# Patient Record
Sex: Female | Born: 1935 | Hispanic: No | Smoking: Never smoker
Health system: Southern US, Community
[De-identification: ages and names within clinical notes are randomized; demographics above are authoritative.]

## PROBLEM LIST (undated history)

## (undated) DIAGNOSIS — Q2543 Congenital aneurysm of aorta: Secondary | ICD-10-CM

## (undated) DIAGNOSIS — I7100 Dissection of unspecified site of aorta: Secondary | ICD-10-CM

## (undated) DIAGNOSIS — I5042 Chronic combined systolic (congestive) and diastolic (congestive) heart failure: Secondary | ICD-10-CM

## (undated) DIAGNOSIS — I700 Atherosclerosis of aorta: Principal | ICD-10-CM

## (undated) DIAGNOSIS — I10 Essential (primary) hypertension: Secondary | ICD-10-CM

## (undated) DIAGNOSIS — I4892 Unspecified atrial flutter: Secondary | ICD-10-CM

## (undated) DIAGNOSIS — I7121 Aneurysm of the ascending aorta, without rupture: Secondary | ICD-10-CM

## (undated) DIAGNOSIS — I482 Chronic atrial fibrillation, unspecified: Secondary | ICD-10-CM

## (undated) DIAGNOSIS — E876 Hypokalemia: Secondary | ICD-10-CM

## (undated) DIAGNOSIS — I719 Aortic aneurysm of unspecified site, without rupture: Secondary | ICD-10-CM

## (undated) HISTORY — DX: Congenital aneurysm of aorta: Q25.43

## (undated) HISTORY — DX: Aneurysm of the ascending aorta, without rupture: I71.21

## (undated) HISTORY — DX: Essential (primary) hypertension: I10

## (undated) HISTORY — DX: Dissection of unspecified site of aorta: I71.00

## (undated) HISTORY — DX: Aortic aneurysm of unspecified site, without rupture: I71.9

## (undated) HISTORY — DX: Hypokalemia: E87.6

## (undated) HISTORY — DX: Chronic atrial fibrillation, unspecified: I48.20

## (undated) HISTORY — DX: Chronic combined systolic (congestive) and diastolic (congestive) heart failure: I50.42

## (undated) HISTORY — DX: Unspecified atrial flutter: I48.92

## (undated) HISTORY — DX: Atherosclerosis of aorta: I70.0

---

## 2012-10-12 HISTORY — PX: ASCENDING AORTIC ANEURYSM REPAIR: SHX1191

## 2012-11-04 ENCOUNTER — Other Ambulatory Visit: Payer: Self-pay | Admitting: Cardiology

## 2012-11-04 DIAGNOSIS — I77810 Thoracic aortic ectasia: Secondary | ICD-10-CM

## 2012-11-06 ENCOUNTER — Ambulatory Visit
Admission: RE | Admit: 2012-11-06 | Discharge: 2012-11-06 | Disposition: A | Discharge status: Home / Self Care | Payer: Medicare PPO | Source: Ambulatory Visit | Attending: Cardiology | Admitting: Cardiology

## 2012-11-06 DIAGNOSIS — I77810 Thoracic aortic ectasia: Secondary | ICD-10-CM

## 2012-11-06 MED ORDER — IOHEXOL 350 MG/ML SOLN
100.0000 mL | Freq: Once | INTRAVENOUS | Status: AC | PRN
  Administered 2012-11-06: 100 mL via INTRAVENOUS

## 2012-11-07 ENCOUNTER — Other Ambulatory Visit: Payer: Self-pay | Admitting: *Deleted

## 2012-11-07 ENCOUNTER — Institutional Professional Consult (permissible substitution) (INDEPENDENT_AMBULATORY_CARE_PROVIDER_SITE_OTHER): Payer: Medicare PPO | Admitting: Cardiothoracic Surgery

## 2012-11-07 ENCOUNTER — Encounter: Payer: Self-pay | Admitting: Cardiothoracic Surgery

## 2012-11-07 VITALS — BP 140/89 | HR 96 | Resp 20 | Ht 60.75 in | Wt 149.0 lb

## 2012-11-07 DIAGNOSIS — I4891 Unspecified atrial fibrillation: Secondary | ICD-10-CM

## 2012-11-07 DIAGNOSIS — I719 Aortic aneurysm of unspecified site, without rupture: Secondary | ICD-10-CM

## 2012-11-07 DIAGNOSIS — I7100 Dissection of unspecified site of aorta: Secondary | ICD-10-CM

## 2012-11-07 DIAGNOSIS — I10 Essential (primary) hypertension: Secondary | ICD-10-CM

## 2012-11-07 NOTE — Progress Notes (Signed)
PCP is Emeterio Reeve, MD Referring Provider is Donato Schultz, MD  Chief Complaint  Patient presents with  . Thoracic Aortic Aneurysm    Referral from Dr Anne Fu for surgical eval on aortic root dilation, CTA Chest 11/06/12, last Echo 05/20/12   History and physical   Admission diagnosis  Intramural hematoma of the ascending and descending thoracic aorta-ascending aortic aneurysm measuring 5.5 cm  HPI: 79 year old [place] female with hypertension but nonsmoker who had an episode of severe chest pain approximately 2 and half to 3 weeks ago described as a "band of pain" when she was lifting up some furniture. She thought she was going to die but did not seek immediate medical attention but is rested for 24-48 hours of gradual improvement in the pain. She is currently asymptomatic. She was evaluated by Dr. Anne Fu for cardiac evaluation at the request of her primary care physician. Dr. Anne Fu ordered a CTA of the thoracic aorta and performed a echocardiogram. The echocardiogram showed good function without significant valvular disease. The CTA showed an ascending fusiform aneurysm with intramural hematoma extending from the aortic root around to the descending thoracic aorta without active extravasation. There is no pericardial effusion.  The patient has long history of atrial fibrillation and is on chronic Coumadin therapy. Her Coumadin is on hold. She had a DC cardioversion proximally 3 years ago in [place] for A. fib.  Past Medical History  Diagnosis Date  . Afib   . Aortic root aneurysm   . Hypertension     No past surgical history on file. no prior surgical procedures  No family history on file. no history of thoracic or bowel aneurysm in the family  Social History History  Substance Use Topics  . Smoking status: Never Smoker   . Smokeless tobacco: Not on file  . Alcohol Use: No    Current Outpatient Prescriptions  Medication Sig Dispense Refill  . acetaminophen (TYLENOL) 325  MG tablet Take 650 mg by mouth every 6 (six) hours as needed for pain.      Marland Kitchen amiodarone (PACERONE) 100 MG tablet Take 100 mg by mouth daily.      Marland Kitchen amLODipine (NORVASC) 10 MG tablet Take 10 mg by mouth daily.      Marland Kitchen warfarin (COUMADIN) 5 MG tablet Take 5 mg by mouth daily.       No current facility-administered medications for this visit.    No Known Allergies  Review of Systems No history of thoracic trauma No history of mini stroke DVT claudication TIA No history of recent pulmonary infection tuberculosis hemoptysis No history of diabetes No history DVT No difficulty swallowing Lives with her daughter but tries to remain fairly active  BP 140/89  Pulse 96  Resp 20  Ht 5' 0.75" (1.543 m)  Wt 149 lb (67.586 kg)  BMI 28.39 kg/m2  SpO2 98% Physical Exam Gen. appearance fairly healthy appearing 79 year old woman no acute distress HEENT normocephalic pupils equal dentition good Neck good carotid pulses no breathing no JVD Thorax no deformity clear breath sounds no tenderness COR no murmur irregular rhythm with  A. Fib Abdomen soft nontender without pulsatile mass Extremities warm good pulses Neuro alert and oriented no focal motor deficit  Diagnostic Tests: CTA of thoracic aorta personally reviewed patient discussed with Dr. Anne Fu. 2-D echo report from [facility] cardiology reviewed  Impression: Intramural hematoma with ascending aneurysm at high risk for dissection. Patient would benefit from replacement of her a sending aorta and possible replacement Bentall.  Plan: Stop  Coumadin now Patient will be admitted to the hospital to follow INR which will need to be normalized prior to surgery which is scheduled for March 31 no heparin due to the intramural hematoma.. Patient will need a 2-D echocardiogram in the hospital as well as PFTs carotid Doppler and cardiac rehabilitation consult.

## 2012-11-08 ENCOUNTER — Inpatient Hospital Stay (HOSPITAL_COMMUNITY): Payer: Medicare PPO

## 2012-11-08 ENCOUNTER — Other Ambulatory Visit: Payer: Self-pay | Admitting: *Deleted

## 2012-11-08 ENCOUNTER — Inpatient Hospital Stay (HOSPITAL_COMMUNITY)
Admission: AD | Admit: 2012-11-08 | Discharge: 2012-11-19 | DRG: 220 | Disposition: A | Discharge status: Skilled Nursing Facility | Payer: Medicare PPO | Source: Ambulatory Visit | Attending: Cardiothoracic Surgery | Admitting: Cardiothoracic Surgery

## 2012-11-08 ENCOUNTER — Encounter (HOSPITAL_COMMUNITY): Payer: Self-pay | Admitting: *Deleted

## 2012-11-08 DIAGNOSIS — I77819 Aortic ectasia, unspecified site: Secondary | ICD-10-CM

## 2012-11-08 DIAGNOSIS — Z01810 Encounter for preprocedural cardiovascular examination: Secondary | ICD-10-CM

## 2012-11-08 DIAGNOSIS — I498 Other specified cardiac arrhythmias: Secondary | ICD-10-CM | POA: Diagnosis present

## 2012-11-08 DIAGNOSIS — IMO0002 Reserved for concepts with insufficient information to code with codable children: Secondary | ICD-10-CM | POA: Diagnosis not present

## 2012-11-08 DIAGNOSIS — I712 Thoracic aortic aneurysm, without rupture, unspecified: Secondary | ICD-10-CM | POA: Diagnosis present

## 2012-11-08 DIAGNOSIS — I10 Essential (primary) hypertension: Secondary | ICD-10-CM | POA: Diagnosis present

## 2012-11-08 DIAGNOSIS — I7100 Dissection of unspecified site of aorta: Secondary | ICD-10-CM | POA: Diagnosis present

## 2012-11-08 DIAGNOSIS — Y849 Medical procedure, unspecified as the cause of abnormal reaction of the patient, or of later complication, without mention of misadventure at the time of the procedure: Secondary | ICD-10-CM | POA: Diagnosis present

## 2012-11-08 DIAGNOSIS — Z7901 Long term (current) use of anticoagulants: Secondary | ICD-10-CM

## 2012-11-08 DIAGNOSIS — I71019 Dissection of thoracic aorta, unspecified: Principal | ICD-10-CM | POA: Diagnosis present

## 2012-11-08 DIAGNOSIS — I4891 Unspecified atrial fibrillation: Secondary | ICD-10-CM | POA: Diagnosis present

## 2012-11-08 DIAGNOSIS — J90 Pleural effusion, not elsewhere classified: Secondary | ICD-10-CM | POA: Diagnosis present

## 2012-11-08 DIAGNOSIS — D62 Acute posthemorrhagic anemia: Secondary | ICD-10-CM | POA: Diagnosis not present

## 2012-11-08 DIAGNOSIS — Z23 Encounter for immunization: Secondary | ICD-10-CM

## 2012-11-08 DIAGNOSIS — I7101 Dissection of thoracic aorta: Principal | ICD-10-CM | POA: Diagnosis present

## 2012-11-08 LAB — MRSA PCR SCREENING: MRSA by PCR: NEGATIVE

## 2012-11-08 LAB — CBC
HCT: 37.2 % (ref 36.0–46.0)
Hemoglobin: 12.2 g/dL (ref 12.0–15.0)
MCH: 28.6 pg (ref 26.0–34.0)
MCHC: 32.8 g/dL (ref 30.0–36.0)
MCV: 87.3 fL (ref 78.0–100.0)
Platelets: 456 10*3/uL — ABNORMAL HIGH (ref 150–400)
RBC: 4.26 MIL/uL (ref 3.87–5.11)
RDW: 14.4 % (ref 11.5–15.5)
WBC: 6.8 10*3/uL (ref 4.0–10.5)

## 2012-11-08 LAB — APTT: aPTT: 41 seconds — ABNORMAL HIGH (ref 24–37)

## 2012-11-08 LAB — URINALYSIS, ROUTINE W REFLEX MICROSCOPIC
Bilirubin Urine: NEGATIVE
Glucose, UA: NEGATIVE mg/dL
Ketones, ur: NEGATIVE mg/dL
Leukocytes, UA: NEGATIVE
Nitrite: NEGATIVE
Protein, ur: NEGATIVE mg/dL
Specific Gravity, Urine: 1.012 (ref 1.005–1.030)
Urobilinogen, UA: 0.2 mg/dL (ref 0.0–1.0)
pH: 6.5 (ref 5.0–8.0)

## 2012-11-08 LAB — PROTIME-INR
INR: 1.61 — ABNORMAL HIGH (ref 0.00–1.49)
Prothrombin Time: 18.6 seconds — ABNORMAL HIGH (ref 11.6–15.2)

## 2012-11-08 LAB — URINE MICROSCOPIC-ADD ON

## 2012-11-08 LAB — TSH: TSH: 0.706 u[IU]/mL (ref 0.350–4.500)

## 2012-11-08 MED ORDER — ACETAMINOPHEN 325 MG PO TABS
650.0000 mg | ORAL_TABLET | Freq: Four times a day (QID) | ORAL | Status: DC | PRN
  Administered 2012-11-10: 650 mg via ORAL
  Filled 2012-11-08: qty 2

## 2012-11-08 MED ORDER — DOCUSATE SODIUM 100 MG PO CAPS
100.0000 mg | ORAL_CAPSULE | Freq: Two times a day (BID) | ORAL | Status: DC
  Administered 2012-11-09 – 2012-11-10 (×4): 100 mg via ORAL
  Filled 2012-11-08 (×7): qty 1

## 2012-11-08 MED ORDER — ONDANSETRON HCL 4 MG PO TABS: 4.0000 mg | ORAL_TABLET | Freq: Four times a day (QID) | ORAL | Status: DC | PRN

## 2012-11-08 MED ORDER — AMIODARONE HCL 100 MG PO TABS
100.0000 mg | ORAL_TABLET | Freq: Every day | ORAL | Status: DC
  Administered 2012-11-09 – 2012-11-10 (×2): 100 mg via ORAL
  Filled 2012-11-08 (×5): qty 1

## 2012-11-08 MED ORDER — SODIUM CHLORIDE 0.9 % IV SOLN: 250.0000 mL | INTRAVENOUS | Status: DC | PRN

## 2012-11-08 MED ORDER — SODIUM CHLORIDE 0.9 % IJ SOLN
3.0000 mL | Freq: Two times a day (BID) | INTRAMUSCULAR | Status: DC
  Administered 2012-11-08 – 2012-11-10 (×4): 3 mL via INTRAVENOUS

## 2012-11-08 MED ORDER — ONDANSETRON HCL 4 MG/2ML IJ SOLN: 4.0000 mg | Freq: Four times a day (QID) | INTRAMUSCULAR | Status: DC | PRN

## 2012-11-08 MED ORDER — ADULT MULTIVITAMIN W/MINERALS CH
1.0000 | ORAL_TABLET | Freq: Every day | ORAL | Status: DC
  Administered 2012-11-08 – 2012-11-10 (×3): 1 via ORAL
  Filled 2012-11-08 (×4): qty 1

## 2012-11-08 MED ORDER — AMLODIPINE BESYLATE 10 MG PO TABS
10.0000 mg | ORAL_TABLET | Freq: Every day | ORAL | Status: DC
  Administered 2012-11-08 – 2012-11-10 (×3): 10 mg via ORAL
  Filled 2012-11-08 (×4): qty 1

## 2012-11-08 MED ORDER — SODIUM CHLORIDE 0.9 % IJ SOLN: 3.0000 mL | INTRAMUSCULAR | Status: DC | PRN

## 2012-11-08 MED ORDER — SODIUM CHLORIDE 0.9 % IJ SOLN
3.0000 mL | Freq: Two times a day (BID) | INTRAMUSCULAR | Status: DC
  Administered 2012-11-09: 3 mL via INTRAVENOUS

## 2012-11-08 MED ORDER — ACETAMINOPHEN 650 MG RE SUPP: 650.0000 mg | Freq: Four times a day (QID) | RECTAL | Status: DC | PRN

## 2012-11-08 MED ORDER — ALUM & MAG HYDROXIDE-SIMETH 200-200-20 MG/5ML PO SUSP: 30.0000 mL | Freq: Four times a day (QID) | ORAL | Status: DC | PRN

## 2012-11-08 NOTE — H&P (Signed)
PCP is Emeterio Reeve, MD  Referring Provider is Donato Schultz, MD  Chief Complaint   Patient presents with   .  Thoracic Aortic Aneurysm     Referral from Dr Anne Fu for surgical eval on aortic root dilation, CTA Chest 11/06/12, last Echo 05/20/12    History and physical per Dr VanTrigt's consultation  Admission diagnosis  Intramural hematoma of the ascending and descending thoracic aorta-ascending aortic aneurysm measuring 5.5 cm  HPI:  79 year old [name] with hypertension but nonsmoker who had an episode of severe chest pain approximately 2 and half to 3 weeks ago described as a "band of pain" when she was lifting up some furniture. She thought she was going to die but did not seek immediate medical attention but is rested for 24-48 hours of gradual improvement in the pain. She is currently asymptomatic. She was evaluated by Dr. Anne Fu for cardiac evaluation at the request of her primary care physician. Dr. Anne Fu ordered a CTA of the thoracic aorta and performed a echocardiogram. The echocardiogram showed good function without significant valvular disease. The CTA showed an ascending fusiform aneurysm with intramural hematoma extending from the aortic root around to the descending thoracic aorta without active extravasation. There is no pericardial effusion.  The patient has long history of atrial fibrillation and is on chronic Coumadin therapy. Her Coumadin is on hold. She had a DC cardioversion proximally 3 years ago in [place] for A. fib.  Past Medical History   Diagnosis  Date   .  Afib    .  Aortic root aneurysm    .  Hypertension     No past surgical history on file. no prior surgical procedures  No family history on file. no history of thoracic or bowel aneurysm in the family  Social History  History   Substance Use Topics   .  Smoking status:  Never Smoker   .  Smokeless tobacco:  Not on file   .  Alcohol Use:  No    Current Outpatient Prescriptions   Medication  Sig   Dispense  Refill   .  acetaminophen (TYLENOL) 325 MG tablet  Take 650 mg by mouth every 6 (six) hours as needed for pain.     Marland Kitchen  amiodarone (PACERONE) 100 MG tablet  Take 100 mg by mouth daily.     Marland Kitchen  amLODipine (NORVASC) 10 MG tablet  Take 10 mg by mouth daily.     Marland Kitchen  warfarin (COUMADIN) 5 MG tablet  Take 5 mg by mouth daily.      No current facility-administered medications for this visit.    No Known Allergies  Review of Systems  No history of thoracic trauma  No history of mini stroke DVT claudication TIA  No history of recent pulmonary infection tuberculosis hemoptysis  No history of diabetes  No history DVT  No difficulty swallowing  Lives with her daughter but tries to remain fairly active  BP 140/89  Pulse 96  Resp 20  Ht 5' 0.75" (1.543 m)  Wt 149 lb (67.586 kg)  BMI 28.39 kg/m2  SpO2 98%  Physical Exam  Gen. appearance fairly healthy appearing 79 year old woman no acute distress  HEENT normocephalic pupils equal dentition good  Neck good carotid pulses no breathing no JVD  Thorax no deformity clear breath sounds no tenderness  COR no murmur irregular rhythm with A. Fib  Abdomen soft nontender without pulsatile mass  Extremities warm good pulses  Neuro alert and oriented no focal  motor deficit  Diagnostic Tests:  CTA of thoracic aorta personally reviewed patient discussed with Dr. Anne Fu. 2-D echo report from [facility] cardiology reviewed  Impression:  Intramural hematoma with ascending aneurysm at high risk for dissection. Patient would benefit from replacement of her a sending aorta and possible replacement Bentall.  Plan:  Stop Coumadin now  Patient will be admitted to the hospital to follow INR which will need to be normalized prior to surgery which is scheduled for March 31 no heparin due to the intramural hematoma.. Patient will need a 2-D echocardiogram in the hospital as well as PFTs carotid Doppler and cardiac rehabilitation consult.

## 2012-11-08 NOTE — Progress Notes (Signed)
11/08/2012 5:24 PM Nursing note Pt. And daughters given cardiac surgery education packet. Contents reviewed. Questions encouraged and addressed.  Krista Som, Blanchard Kelch

## 2012-11-08 NOTE — Progress Notes (Signed)
*  PRELIMINARY RESULTS* Echocardiogram 2D Echocardiogram has been performed.  Jeryl Columbia 11/08/2012, 3:26 PM

## 2012-11-09 ENCOUNTER — Encounter (HOSPITAL_COMMUNITY): Payer: Self-pay | Admitting: Anesthesiology

## 2012-11-09 ENCOUNTER — Inpatient Hospital Stay (HOSPITAL_COMMUNITY): Payer: Medicare PPO

## 2012-11-09 DIAGNOSIS — I712 Thoracic aortic aneurysm, without rupture: Secondary | ICD-10-CM

## 2012-11-09 LAB — PROTIME-INR
INR: 1.46 (ref 0.00–1.49)
Prothrombin Time: 17.3 seconds — ABNORMAL HIGH (ref 11.6–15.2)

## 2012-11-09 LAB — BLOOD GAS, ARTERIAL
Acid-Base Excess: 2.6 mmol/L — ABNORMAL HIGH (ref 0.0–2.0)
Bicarbonate: 26.3 mEq/L — ABNORMAL HIGH (ref 20.0–24.0)
Drawn by: 24487
FIO2: 0.21 %
O2 Saturation: 91.2 %
Patient temperature: 98.6
TCO2: 27.5 mmol/L (ref 0–100)
pCO2 arterial: 38 mmHg (ref 35.0–45.0)
pH, Arterial: 7.455 — ABNORMAL HIGH (ref 7.350–7.450)
pO2, Arterial: 58.3 mmHg — ABNORMAL LOW (ref 80.0–100.0)

## 2012-11-09 LAB — COMPREHENSIVE METABOLIC PANEL
ALT: 28 U/L (ref 0–35)
AST: 19 U/L (ref 0–37)
Albumin: 2.7 g/dL — ABNORMAL LOW (ref 3.5–5.2)
Alkaline Phosphatase: 76 U/L (ref 39–117)
BUN: 13 mg/dL (ref 6–23)
CO2: 26 mEq/L (ref 19–32)
Calcium: 9 mg/dL (ref 8.4–10.5)
Chloride: 105 mEq/L (ref 96–112)
Creatinine, Ser: 0.88 mg/dL (ref 0.50–1.10)
GFR calc Af Amer: 69 mL/min — ABNORMAL LOW (ref >90)
GFR calc non Af Amer: 60 mL/min — ABNORMAL LOW (ref >90)
Glucose, Bld: 114 mg/dL — ABNORMAL HIGH (ref 70–99)
Potassium: 3.5 mEq/L (ref 3.5–5.1)
Sodium: 140 mEq/L (ref 135–145)
Total Bilirubin: 0.6 mg/dL (ref 0.3–1.2)
Total Protein: 7 g/dL (ref 6.0–8.3)

## 2012-11-09 NOTE — Progress Notes (Addendum)
                    [address]            [name] [address]          [phone]       Procedure(s) (LRB): BENTALL PROCEDURE (N/A) INTRAOPERATIVE TRANSESOPHAGEAL ECHOCARDIOGRAM (N/A)  Subjective: Doesn't feel as well this am, no specific complaints.  No pain.   Objective: Vital signs in last 24 hours: Patient Vitals for the past 24 hrs:  BP Temp Temp src Pulse Resp SpO2 Height Weight  11/09/12 0421 126/69 mmHg 98.2 F (36.8 C) Oral 71 20 92 % - 150 lb 1.6 oz (68.085 kg)  11/08/12 1928 126/73 mmHg 97.9 F (36.6 C) Oral 76 18 91 % - -  11/08/12 1757 128/70 mmHg 98.1 F (36.7 C) Oral 78 18 92 % 5\' 1"  (1.549 m) 146 lb 9.7 oz (66.5 kg)  11/08/12 1622 128/70 mmHg 98.1 F (36.7 C) - 78 18 92 % - -  11/08/12 1217 125/76 mmHg - - 70 18 92 % 5' 0.1" (1.527 m) 146 lb 9.7 oz (66.5 kg)   Current Weight  11/09/12 150 lb 1.6 oz (68.085 kg)     Intake/Output from previous day:      PHYSICAL EXAM:  Heart: RRR Lungs: Clear Abdomen: soft, NT/ND, +BS Extremities:  Warm, well perfused    Lab Results: CBC: Recent Labs  11/08/12 1356  WBC 6.8  HGB 12.2  HCT 37.2  PLT 456*   BMET:  Recent Labs  11/09/12 0500  NA 140  K 3.5  CL 105  CO2 26  GLUCOSE 114*  BUN 13  CREATININE 0.88  CALCIUM 9.0    PT/INR:  Recent Labs  11/09/12 0500  LABPROT 17.3*  INR 1.46   2D Echo:   Study Conclusions - Left ventricle: The cavity size was normal. Systolic function was normal. The estimated ejection fraction was in the range of 55% to 60%. Although no diagnostic regional wall motion abnormality was identified, this possibility cannot be completely excluded on the basis of this study. - Ascending aorta: The ascending aorta was moderately dilated, measuring up to 46 mm. CT report measures a 59 mm aneurysm. This may be beyond the view of the transthoracic echocardiogram. - Left atrium: The atrium was mildly dilated. - Tricuspid valve: Leaflet  thickening. Transthoracic echocardiography. M-mode, complete 2D, spectral Doppler, and color Doppler. Height: Height: 152.7cm. Height: 60.1in. Weight: Weight: 66.5kg. Weight: 146.3lb. Body mass index: BMI: 28.5kg/m^2. Body surface area: BSA: 1.76m^2. Blood pressure: 125/76. Patient status: Inpatient. Location: Echo laboratory.      Assessment/Plan: S/P Procedure(s) (LRB): BENTALL PROCEDURE (N/A) INTRAOPERATIVE TRANSESOPHAGEAL ECHOCARDIOGRAM (N/A) Stable, no pain at present. INR trending down appropriately.   Continue pre-op management and workup.   LOS: 1 day    [name],[name] 11/09/2012  No chest pain today Inr1.46 Surgery planned Monday I have seen and examined [name] and agree with the above assessment  and plan.  Delight Ovens MD Beeper [phone] Office [phone] 11/09/2012 11:10 AM

## 2012-11-09 NOTE — Preoperative (Signed)
Beta Blockers   Reason not to administer Beta Blockers:Not Applicable 

## 2012-11-09 NOTE — Progress Notes (Signed)
Spoke with pt and grandson re: IS (instructed in use), mobility and sternal precautions. Moving well. Grandson helped translate and pt is understanding. Pt does st normally her daughter works during the day. RN to address this with daughter for d/c preparations. Will f/u post op [phone] Ethelda Chick CES, aCSM 11/09/2012 2:57 PM

## 2012-11-10 ENCOUNTER — Other Ambulatory Visit: Payer: Self-pay

## 2012-11-10 LAB — PROTIME-INR
INR: 1.32 (ref 0.00–1.49)
Prothrombin Time: 16.1 seconds — ABNORMAL HIGH (ref 11.6–15.2)

## 2012-11-10 LAB — COMPREHENSIVE METABOLIC PANEL
ALT: 30 U/L (ref 0–35)
AST: 21 U/L (ref 0–37)
Albumin: 2.8 g/dL — ABNORMAL LOW (ref 3.5–5.2)
Alkaline Phosphatase: 83 U/L (ref 39–117)
BUN: 13 mg/dL (ref 6–23)
CO2: 25 mEq/L (ref 19–32)
Calcium: 9.1 mg/dL (ref 8.4–10.5)
Chloride: 102 mEq/L (ref 96–112)
Creatinine, Ser: 0.84 mg/dL (ref 0.50–1.10)
GFR calc Af Amer: 73 mL/min — ABNORMAL LOW (ref >90)
GFR calc non Af Amer: 63 mL/min — ABNORMAL LOW (ref >90)
Glucose, Bld: 115 mg/dL — ABNORMAL HIGH (ref 70–99)
Potassium: 3.7 mEq/L (ref 3.5–5.1)
Sodium: 139 mEq/L (ref 135–145)
Total Bilirubin: 0.6 mg/dL (ref 0.3–1.2)
Total Protein: 7.7 g/dL (ref 6.0–8.3)

## 2012-11-10 LAB — CBC
HCT: 37.9 % (ref 36.0–46.0)
Hemoglobin: 12 g/dL (ref 12.0–15.0)
MCH: 27.9 pg (ref 26.0–34.0)
MCHC: 31.7 g/dL (ref 30.0–36.0)
MCV: 88.1 fL (ref 78.0–100.0)
Platelets: 541 10*3/uL — ABNORMAL HIGH (ref 150–400)
RBC: 4.3 MIL/uL (ref 3.87–5.11)
RDW: 14.4 % (ref 11.5–15.5)
WBC: 7.1 10*3/uL (ref 4.0–10.5)

## 2012-11-10 LAB — ABO/RH: ABO/RH(D): A POS

## 2012-11-10 MED ORDER — SODIUM CHLORIDE 0.9 % IV SOLN
INTRAVENOUS | Status: AC
  Administered 2012-11-11: 3.3 [IU]/h via INTRAVENOUS
  Filled 2012-11-10: qty 1

## 2012-11-10 MED ORDER — DEXMEDETOMIDINE HCL IN NACL 400 MCG/100ML IV SOLN
0.1000 ug/kg/h | INTRAVENOUS | Status: AC
  Administered 2012-11-11: 0.5 ug/kg/h via INTRAVENOUS
  Filled 2012-11-10: qty 100

## 2012-11-10 MED ORDER — POTASSIUM CHLORIDE 2 MEQ/ML IV SOLN
80.0000 meq | INTRAVENOUS | Status: DC
  Filled 2012-11-10: qty 40

## 2012-11-10 MED ORDER — CHLORHEXIDINE GLUCONATE 4 % EX LIQD
60.0000 mL | Freq: Once | CUTANEOUS | Status: DC
  Filled 2012-11-10 (×2): qty 60

## 2012-11-10 MED ORDER — SODIUM CHLORIDE 0.9 % IV SOLN
INTRAVENOUS | Status: AC
  Administered 2012-11-11: 70 mL/h via INTRAVENOUS
  Filled 2012-11-10: qty 40

## 2012-11-10 MED ORDER — PLASMA-LYTE 148 IV SOLN
INTRAVENOUS | Status: AC
  Administered 2012-11-11: 09:00:00
  Filled 2012-11-10: qty 2.5

## 2012-11-10 MED ORDER — EPINEPHRINE HCL 1 MG/ML IJ SOLN
0.5000 ug/min | INTRAVENOUS | Status: DC
  Filled 2012-11-10: qty 4

## 2012-11-10 MED ORDER — DEXTROSE 5 % IV SOLN
1.5000 g | INTRAVENOUS | Status: AC
  Administered 2012-11-11: .75 g via INTRAVENOUS
  Administered 2012-11-11: 1.5 g via INTRAVENOUS
  Filled 2012-11-10: qty 1.5

## 2012-11-10 MED ORDER — VANCOMYCIN HCL 10 G IV SOLR
1250.0000 mg | INTRAVENOUS | Status: AC
  Administered 2012-11-11: 1250 mg via INTRAVENOUS
  Filled 2012-11-10: qty 1250

## 2012-11-10 MED ORDER — BISACODYL 5 MG PO TBEC
5.0000 mg | DELAYED_RELEASE_TABLET | Freq: Once | ORAL | Status: AC
  Administered 2012-11-10: 5 mg via ORAL
  Filled 2012-11-10: qty 1

## 2012-11-10 MED ORDER — METOPROLOL TARTRATE 12.5 MG HALF TABLET
12.5000 mg | ORAL_TABLET | Freq: Once | ORAL | Status: AC
Start: 2012-11-11 — End: 2012-11-11
  Administered 2012-11-11: 12.5 mg via ORAL
  Filled 2012-11-10: qty 1

## 2012-11-10 MED ORDER — MAGNESIUM SULFATE 50 % IJ SOLN
40.0000 meq | INTRAMUSCULAR | Status: DC
  Filled 2012-11-10: qty 10

## 2012-11-10 MED ORDER — TEMAZEPAM 15 MG PO CAPS
15.0000 mg | ORAL_CAPSULE | Freq: Once | ORAL | Status: AC | PRN
  Administered 2012-11-10: 15 mg via ORAL
  Filled 2012-11-10: qty 1

## 2012-11-10 MED ORDER — DOPAMINE-DEXTROSE 3.2-5 MG/ML-% IV SOLN
2.0000 ug/kg/min | INTRAVENOUS | Status: DC
  Administered 2012-11-11: 3 ug/kg/min via INTRAVENOUS
  Filled 2012-11-10: qty 250

## 2012-11-10 MED ORDER — DEXTROSE 5 % IV SOLN
750.0000 mg | INTRAVENOUS | Status: DC
  Filled 2012-11-10: qty 750

## 2012-11-10 MED ORDER — NITROGLYCERIN IN D5W 200-5 MCG/ML-% IV SOLN
2.0000 ug/min | INTRAVENOUS | Status: DC
  Filled 2012-11-10: qty 250

## 2012-11-10 MED ORDER — CHLORHEXIDINE GLUCONATE 4 % EX LIQD
60.0000 mL | Freq: Once | CUTANEOUS | Status: AC
  Administered 2012-11-10: 4 via TOPICAL
  Filled 2012-11-10: qty 60

## 2012-11-10 MED ORDER — PHENYLEPHRINE HCL 10 MG/ML IJ SOLN
30.0000 ug/min | INTRAVENOUS | Status: AC
  Administered 2012-11-11: 5 ug/min via INTRAVENOUS
  Filled 2012-11-10: qty 2

## 2012-11-10 NOTE — Progress Notes (Addendum)
                    [address]            [name] [address]          [phone]     * Surgery Date in Future * Procedure(s) (LRB): BENTALL PROCEDURE (N/A) INTRAOPERATIVE TRANSESOPHAGEAL ECHOCARDIOGRAM (N/A)  Subjective: Stable night, no complaints.  Rested well.  No back or chest pain.   Objective: Vital signs in last 24 hours: Patient Vitals for the past 24 hrs:  BP Temp Temp src Pulse Resp SpO2 Weight  11/10/12 0530 129/81 mmHg 98 F (36.7 C) Oral 73 18 92 % 146 lb (66.225 kg)  11/09/12 1954 113/69 mmHg 99.5 F (37.5 C) Oral 80 20 94 % -  11/09/12 1407 121/63 mmHg 98.1 F (36.7 C) Oral 77 20 95 % -  11/09/12 1026 114/73 mmHg - - 70 - - -   Current Weight  11/10/12 146 lb (66.225 kg)     Intake/Output from previous day:      PHYSICAL EXAM:  Heart: RRR Lungs: Clear Abdomen: soft, NT/ND, +BS Extremities: No edema    Lab Results: CBC: Recent Labs  11/08/12 1356 11/10/12 0630  WBC 6.8 7.1  HGB 12.2 12.0  HCT 37.2 37.9  PLT 456* 541*   BMET:  Recent Labs  11/09/12 0500  NA 140  K 3.5  CL 105  CO2 26  GLUCOSE 114*  BUN 13  CREATININE 0.88  CALCIUM 9.0    PT/INR:  Recent Labs  11/10/12 0630  LABPROT 16.1*  INR 1.32      Assessment/Plan: S/P Procedure(s) (LRB): BENTALL PROCEDURE (N/A) INTRAOPERATIVE TRANSESOPHAGEAL ECHOCARDIOGRAM (N/A) Stable for surgery in am.   LOS: 2 days    [name],[name] 11/10/2012  inr 1.3, cr stable No chest pain or sob today For surgery per Dr Maren Beach in am Orders written I have seen and examined [name] and agree with the above assessment  and plan.  Delight Ovens MD Beeper [phone] Office [phone] 11/10/2012 11:16 AM

## 2012-11-11 ENCOUNTER — Encounter (HOSPITAL_COMMUNITY): Payer: Self-pay | Admitting: Certified Registered Nurse Anesthetist

## 2012-11-11 ENCOUNTER — Encounter (HOSPITAL_COMMUNITY): Payer: Self-pay | Admitting: Anesthesiology

## 2012-11-11 ENCOUNTER — Encounter (HOSPITAL_COMMUNITY)
Admission: AD | Disposition: A | Discharge status: Skilled Nursing Facility | Payer: Self-pay | Source: Ambulatory Visit | Attending: Cardiothoracic Surgery

## 2012-11-11 ENCOUNTER — Inpatient Hospital Stay (HOSPITAL_COMMUNITY): Payer: Medicare PPO

## 2012-11-11 ENCOUNTER — Inpatient Hospital Stay (HOSPITAL_COMMUNITY): Payer: Medicare PPO | Admitting: Anesthesiology

## 2012-11-11 DIAGNOSIS — I712 Thoracic aortic aneurysm, without rupture: Secondary | ICD-10-CM

## 2012-11-11 HISTORY — PX: BENTALL PROCEDURE: SHX5058

## 2012-11-11 HISTORY — PX: INTRAOPERATIVE TRANSESOPHAGEAL ECHOCARDIOGRAM: SHX5062

## 2012-11-11 LAB — POCT I-STAT 3, ART BLOOD GAS (G3+)
Acid-Base Excess: 1 mmol/L (ref 0.0–2.0)
Acid-base deficit: 1 mmol/L (ref 0.0–2.0)
Acid-base deficit: 3 mmol/L — ABNORMAL HIGH (ref 0.0–2.0)
Bicarbonate: 23 mEq/L (ref 20.0–24.0)
Bicarbonate: 21.9 mEq/L (ref 20.0–24.0)
Bicarbonate: 25.4 mEq/L — ABNORMAL HIGH (ref 20.0–24.0)
Bicarbonate: 24.2 mEq/L — ABNORMAL HIGH (ref 20.0–24.0)
O2 Saturation: 100 %
O2 Saturation: 100 %
O2 Saturation: 89 %
O2 Saturation: 97 %
Patient temperature: 34.4
Patient temperature: 36.7
TCO2: 24 mmol/L (ref 0–100)
TCO2: 23 mmol/L (ref 0–100)
TCO2: 27 mmol/L (ref 0–100)
TCO2: 25 mmol/L (ref 0–100)
pCO2 arterial: 33.5 mmHg — ABNORMAL LOW (ref 35.0–45.0)
pCO2 arterial: 36.5 mmHg (ref 35.0–45.0)
pCO2 arterial: 34.9 mmHg — ABNORMAL LOW (ref 35.0–45.0)
pCO2 arterial: 36.9 mmHg (ref 35.0–45.0)
pH, Arterial: 7.445 (ref 7.350–7.450)
pH, Arterial: 7.386 (ref 7.350–7.450)
pH, Arterial: 7.46 — ABNORMAL HIGH (ref 7.350–7.450)
pH, Arterial: 7.423 (ref 7.350–7.450)
pO2, Arterial: 408 mmHg — ABNORMAL HIGH (ref 80.0–100.0)
pO2, Arterial: 304 mmHg — ABNORMAL HIGH (ref 80.0–100.0)
pO2, Arterial: 47 mmHg — ABNORMAL LOW (ref 80.0–100.0)
pO2, Arterial: 86 mmHg (ref 80.0–100.0)

## 2012-11-11 LAB — POCT I-STAT 4, (NA,K, GLUC, HGB,HCT)
Glucose, Bld: 171 mg/dL — ABNORMAL HIGH (ref 70–99)
Glucose, Bld: 76 mg/dL (ref 70–99)
Glucose, Bld: 87 mg/dL (ref 70–99)
Glucose, Bld: 141 mg/dL — ABNORMAL HIGH (ref 70–99)
Glucose, Bld: 86 mg/dL (ref 70–99)
Glucose, Bld: 170 mg/dL — ABNORMAL HIGH (ref 70–99)
Glucose, Bld: 132 mg/dL — ABNORMAL HIGH (ref 70–99)
HCT: 33 % — ABNORMAL LOW (ref 36.0–46.0)
HCT: 26 % — ABNORMAL LOW (ref 36.0–46.0)
HCT: 32 % — ABNORMAL LOW (ref 36.0–46.0)
HCT: 20 % — ABNORMAL LOW (ref 36.0–46.0)
HCT: 27 % — ABNORMAL LOW (ref 36.0–46.0)
HCT: 28 % — ABNORMAL LOW (ref 36.0–46.0)
HCT: 40 % (ref 36.0–46.0)
Hemoglobin: 11.2 g/dL — ABNORMAL LOW (ref 12.0–15.0)
Hemoglobin: 10.9 g/dL — ABNORMAL LOW (ref 12.0–15.0)
Hemoglobin: 8.8 g/dL — ABNORMAL LOW (ref 12.0–15.0)
Hemoglobin: 6.8 g/dL — CL (ref 12.0–15.0)
Hemoglobin: 9.2 g/dL — ABNORMAL LOW (ref 12.0–15.0)
Hemoglobin: 9.5 g/dL — ABNORMAL LOW (ref 12.0–15.0)
Hemoglobin: 13.6 g/dL (ref 12.0–15.0)
Potassium: 3.6 mEq/L (ref 3.5–5.1)
Potassium: 3.3 mEq/L — ABNORMAL LOW (ref 3.5–5.1)
Potassium: 3.5 mEq/L (ref 3.5–5.1)
Potassium: 3.7 mEq/L (ref 3.5–5.1)
Potassium: 4 mEq/L (ref 3.5–5.1)
Potassium: 3.4 mEq/L — ABNORMAL LOW (ref 3.5–5.1)
Potassium: 3 mEq/L — ABNORMAL LOW (ref 3.5–5.1)
Sodium: 142 mEq/L (ref 135–145)
Sodium: 138 mEq/L (ref 135–145)
Sodium: 143 mEq/L (ref 135–145)
Sodium: 139 mEq/L (ref 135–145)
Sodium: 140 mEq/L (ref 135–145)
Sodium: 145 mEq/L (ref 135–145)
Sodium: 146 mEq/L — ABNORMAL HIGH (ref 135–145)

## 2012-11-11 LAB — CBC
HCT: 36.3 % (ref 36.0–46.0)
HCT: 38 % (ref 36.0–46.0)
HCT: 35.3 % — ABNORMAL LOW (ref 36.0–46.0)
Hemoglobin: 12.2 g/dL (ref 12.0–15.0)
Hemoglobin: 13.5 g/dL (ref 12.0–15.0)
Hemoglobin: 12.1 g/dL (ref 12.0–15.0)
MCH: 28.8 pg (ref 26.0–34.0)
MCH: 29.7 pg (ref 26.0–34.0)
MCH: 28.8 pg (ref 26.0–34.0)
MCHC: 33.6 g/dL (ref 30.0–36.0)
MCHC: 35.5 g/dL (ref 30.0–36.0)
MCHC: 34.3 g/dL (ref 30.0–36.0)
MCV: 85.6 fL (ref 78.0–100.0)
MCV: 83.5 fL (ref 78.0–100.0)
MCV: 84 fL (ref 78.0–100.0)
Platelets: 504 10*3/uL — ABNORMAL HIGH (ref 150–400)
Platelets: 261 10*3/uL (ref 150–400)
Platelets: 253 10*3/uL (ref 150–400)
RBC: 4.24 MIL/uL (ref 3.87–5.11)
RBC: 4.55 MIL/uL (ref 3.87–5.11)
RBC: 4.2 MIL/uL (ref 3.87–5.11)
RDW: 14.3 % (ref 11.5–15.5)
RDW: 14.4 % (ref 11.5–15.5)
RDW: 14.5 % (ref 11.5–15.5)
WBC: 7.5 10*3/uL (ref 4.0–10.5)
WBC: 16 10*3/uL — ABNORMAL HIGH (ref 4.0–10.5)
WBC: 11.6 10*3/uL — ABNORMAL HIGH (ref 4.0–10.5)

## 2012-11-11 LAB — GLUCOSE, CAPILLARY
Glucose-Capillary: 113 mg/dL — ABNORMAL HIGH (ref 70–99)
Glucose-Capillary: 117 mg/dL — ABNORMAL HIGH (ref 70–99)
Glucose-Capillary: 127 mg/dL — ABNORMAL HIGH (ref 70–99)
Glucose-Capillary: 122 mg/dL — ABNORMAL HIGH (ref 70–99)
Glucose-Capillary: 119 mg/dL — ABNORMAL HIGH (ref 70–99)

## 2012-11-11 LAB — POCT I-STAT, CHEM 8
BUN: 8 mg/dL (ref 6–23)
Calcium, Ion: 0.97 mmol/L — ABNORMAL LOW (ref 1.13–1.30)
Chloride: 111 mEq/L (ref 96–112)
Creatinine, Ser: 0.6 mg/dL (ref 0.50–1.10)
Glucose, Bld: 129 mg/dL — ABNORMAL HIGH (ref 70–99)
HCT: 35 % — ABNORMAL LOW (ref 36.0–46.0)
Hemoglobin: 11.9 g/dL — ABNORMAL LOW (ref 12.0–15.0)
Potassium: 4.1 mEq/L (ref 3.5–5.1)
Sodium: 146 mEq/L — ABNORMAL HIGH (ref 135–145)
TCO2: 24 mmol/L (ref 0–100)

## 2012-11-11 LAB — HEMOGLOBIN AND HEMATOCRIT, BLOOD
HCT: 26.8 % — ABNORMAL LOW (ref 36.0–46.0)
Hemoglobin: 9.3 g/dL — ABNORMAL LOW (ref 12.0–15.0)

## 2012-11-11 LAB — APTT: aPTT: 36 seconds (ref 24–37)

## 2012-11-11 LAB — BASIC METABOLIC PANEL
BUN: 12 mg/dL (ref 6–23)
CO2: 27 mEq/L (ref 19–32)
Calcium: 8.8 mg/dL (ref 8.4–10.5)
Chloride: 105 mEq/L (ref 96–112)
Creatinine, Ser: 0.82 mg/dL (ref 0.50–1.10)
GFR calc Af Amer: 75 mL/min — ABNORMAL LOW (ref >90)
GFR calc non Af Amer: 65 mL/min — ABNORMAL LOW (ref >90)
Glucose, Bld: 115 mg/dL — ABNORMAL HIGH (ref 70–99)
Potassium: 3.4 mEq/L — ABNORMAL LOW (ref 3.5–5.1)
Sodium: 141 mEq/L (ref 135–145)

## 2012-11-11 LAB — CREATININE, SERUM
Creatinine, Ser: 0.59 mg/dL (ref 0.50–1.10)
GFR calc Af Amer: >90 mL/min (ref >90)
GFR calc non Af Amer: 83 mL/min — ABNORMAL LOW (ref >90)

## 2012-11-11 LAB — PREPARE RBC (CROSSMATCH)

## 2012-11-11 LAB — MAGNESIUM: Magnesium: 3.2 mg/dL — ABNORMAL HIGH (ref 1.5–2.5)

## 2012-11-11 LAB — PROTIME-INR
INR: 1.25 (ref 0.00–1.49)
INR: 1.64 — ABNORMAL HIGH (ref 0.00–1.49)
Prothrombin Time: 15.5 seconds — ABNORMAL HIGH (ref 11.6–15.2)
Prothrombin Time: 18.9 seconds — ABNORMAL HIGH (ref 11.6–15.2)

## 2012-11-11 LAB — PLATELET COUNT: Platelets: 193 10*3/uL (ref 150–400)

## 2012-11-11 SURGERY — BENTALL PROCEDURE
Anesthesia: General | Site: Chest | Wound class: Clean

## 2012-11-11 MED ORDER — ALBUMIN HUMAN 5 % IV SOLN
250.0000 mL | INTRAVENOUS | Status: AC | PRN
  Administered 2012-11-11 (×2): 250 mL via INTRAVENOUS

## 2012-11-11 MED ORDER — PNEUMOCOCCAL VAC POLYVALENT 25 MCG/0.5ML IJ INJ: 0.5000 mL | INJECTION | INTRAMUSCULAR | Status: DC | PRN

## 2012-11-11 MED ORDER — OXYCODONE HCL 5 MG PO TABS: 5.0000 mg | ORAL_TABLET | ORAL | Status: DC | PRN

## 2012-11-11 MED ORDER — ETOMIDATE 2 MG/ML IV SOLN
INTRAVENOUS | Status: DC | PRN
  Administered 2012-11-11 (×2): 4 mg via INTRAVENOUS

## 2012-11-11 MED ORDER — ALBUMIN HUMAN 5 % IV SOLN
INTRAVENOUS | Status: DC | PRN
  Administered 2012-11-11: 14:00:00 via INTRAVENOUS

## 2012-11-11 MED ORDER — SODIUM CHLORIDE 0.9 % IJ SOLN
OROMUCOSAL | Status: DC | PRN
  Administered 2012-11-11 (×3): via TOPICAL

## 2012-11-11 MED ORDER — LACTATED RINGERS IV SOLN: 500.0000 mL | Freq: Once | INTRAVENOUS | Status: AC | PRN

## 2012-11-11 MED ORDER — LACTATED RINGERS IV SOLN
INTRAVENOUS | Status: DC | PRN
  Administered 2012-11-11 (×2): via INTRAVENOUS

## 2012-11-11 MED ORDER — NITROGLYCERIN IN D5W 200-5 MCG/ML-% IV SOLN: 0.0000 ug/min | INTRAVENOUS | Status: DC

## 2012-11-11 MED ORDER — FAMOTIDINE IN NACL 20-0.9 MG/50ML-% IV SOLN
20.0000 mg | Freq: Two times a day (BID) | INTRAVENOUS | Status: AC
  Administered 2012-11-11 (×2): 20 mg via INTRAVENOUS
  Filled 2012-11-11: qty 50

## 2012-11-11 MED ORDER — METOPROLOL TARTRATE 25 MG/10 ML ORAL SUSPENSION
12.5000 mg | Freq: Two times a day (BID) | ORAL | Status: DC
  Administered 2012-11-11: 12.5 mg
  Filled 2012-11-11 (×7): qty 5

## 2012-11-11 MED ORDER — LACTATED RINGERS IV SOLN
INTRAVENOUS | Status: DC | PRN
  Administered 2012-11-11: 07:00:00 via INTRAVENOUS

## 2012-11-11 MED ORDER — METHYLPREDNISOLONE SODIUM SUCC 125 MG IJ SOLR
INTRAMUSCULAR | Status: DC | PRN
  Administered 2012-11-11: 125 mg via INTRAVENOUS

## 2012-11-11 MED ORDER — HEPARIN SODIUM (PORCINE) 1000 UNIT/ML IJ SOLN
INTRAMUSCULAR | Status: DC | PRN
  Administered 2012-11-11: 8000 [IU] via INTRAVENOUS
  Administered 2012-11-11: 22000 [IU] via INTRAVENOUS

## 2012-11-11 MED ORDER — INFLUENZA VIRUS VACC SPLIT PF IM SUSP: 0.5000 mL | INTRAMUSCULAR | Status: DC | PRN

## 2012-11-11 MED ORDER — ARTIFICIAL TEARS OP OINT
TOPICAL_OINTMENT | OPHTHALMIC | Status: DC | PRN
  Administered 2012-11-11: 1 via OPHTHALMIC

## 2012-11-11 MED ORDER — SODIUM CHLORIDE 0.45 % IV SOLN
INTRAVENOUS | Status: DC
  Administered 2012-11-11: 16:00:00 via INTRAVENOUS

## 2012-11-11 MED ORDER — VANCOMYCIN HCL IN DEXTROSE 1-5 GM/200ML-% IV SOLN
1000.0000 mg | Freq: Once | INTRAVENOUS | Status: AC
  Administered 2012-11-11: 1000 mg via INTRAVENOUS
  Filled 2012-11-11: qty 200

## 2012-11-11 MED ORDER — SODIUM CHLORIDE 0.9 % IV SOLN
INTRAVENOUS | Status: DC
  Filled 2012-11-11: qty 1

## 2012-11-11 MED ORDER — PANTOPRAZOLE SODIUM 40 MG PO TBEC
40.0000 mg | DELAYED_RELEASE_TABLET | Freq: Every day | ORAL | Status: DC
  Administered 2012-11-13 – 2012-11-19 (×7): 40 mg via ORAL
  Filled 2012-11-11 (×7): qty 1

## 2012-11-11 MED ORDER — BISACODYL 10 MG RE SUPP: 10.0000 mg | Freq: Every day | RECTAL | Status: DC

## 2012-11-11 MED ORDER — ONDANSETRON HCL 4 MG/2ML IJ SOLN
4.0000 mg | Freq: Four times a day (QID) | INTRAMUSCULAR | Status: DC | PRN
  Administered 2012-11-12: 4 mg via INTRAVENOUS
  Filled 2012-11-11: qty 2

## 2012-11-11 MED ORDER — MAGNESIUM SULFATE 40 MG/ML IJ SOLN
4.0000 g | Freq: Once | INTRAMUSCULAR | Status: AC
  Administered 2012-11-11: 4 g via INTRAVENOUS
  Filled 2012-11-11: qty 100

## 2012-11-11 MED ORDER — INSULIN REGULAR BOLUS VIA INFUSION
0.0000 [IU] | Freq: Three times a day (TID) | INTRAVENOUS | Status: DC
  Filled 2012-11-11: qty 10

## 2012-11-11 MED ORDER — SUFENTANIL CITRATE 50 MCG/ML IV SOLN
INTRAVENOUS | Status: DC | PRN
  Administered 2012-11-11: 30 ug via INTRAVENOUS
  Administered 2012-11-11: 10 ug via INTRAVENOUS
  Administered 2012-11-11: 35 ug via INTRAVENOUS
  Administered 2012-11-11: 20 ug via INTRAVENOUS
  Administered 2012-11-11: 5 ug via INTRAVENOUS
  Administered 2012-11-11: 20 ug via INTRAVENOUS

## 2012-11-11 MED ORDER — ASPIRIN 81 MG PO CHEW: 324.0000 mg | CHEWABLE_TABLET | Freq: Every day | ORAL | Status: DC

## 2012-11-11 MED ORDER — HEMOSTATIC AGENTS (NO CHARGE) OPTIME
TOPICAL | Status: DC | PRN
  Administered 2012-11-11: 1 via TOPICAL

## 2012-11-11 MED ORDER — LIDOCAINE HCL (CARDIAC) 20 MG/ML IV SOLN
INTRAVENOUS | Status: DC | PRN
  Administered 2012-11-11: 60 mg via INTRAVENOUS

## 2012-11-11 MED ORDER — DOCUSATE SODIUM 100 MG PO CAPS
200.0000 mg | ORAL_CAPSULE | Freq: Every day | ORAL | Status: DC
  Administered 2012-11-12 – 2012-11-19 (×7): 200 mg via ORAL
  Filled 2012-11-11 (×8): qty 2

## 2012-11-11 MED ORDER — SODIUM CHLORIDE 0.9 % IJ SOLN
3.0000 mL | INTRAMUSCULAR | Status: DC | PRN
  Administered 2012-11-12: 3 mL via INTRAVENOUS

## 2012-11-11 MED ORDER — MORPHINE SULFATE 2 MG/ML IJ SOLN
1.0000 mg | INTRAMUSCULAR | Status: AC | PRN
  Administered 2012-11-11: 2 mg via INTRAVENOUS
  Filled 2012-11-11 (×2): qty 1

## 2012-11-11 MED ORDER — SODIUM CHLORIDE 0.9 % IV SOLN: INTRAVENOUS | Status: DC

## 2012-11-11 MED ORDER — EPHEDRINE SULFATE 50 MG/ML IJ SOLN
INTRAMUSCULAR | Status: DC | PRN
  Administered 2012-11-11: 2.5 mg via INTRAVENOUS

## 2012-11-11 MED ORDER — METOPROLOL TARTRATE 1 MG/ML IV SOLN: 2.5000 mg | INTRAVENOUS | Status: DC | PRN

## 2012-11-11 MED ORDER — SODIUM CHLORIDE 0.9 % IV SOLN
250.0000 mL | INTRAVENOUS | Status: DC
Start: 2012-11-12 — End: 2012-11-12
  Administered 2012-11-12: 250 mL via INTRAVENOUS

## 2012-11-11 MED ORDER — MORPHINE SULFATE 2 MG/ML IJ SOLN
2.0000 mg | INTRAMUSCULAR | Status: DC | PRN
  Administered 2012-11-12: 2 mg via INTRAVENOUS

## 2012-11-11 MED ORDER — DEXMEDETOMIDINE HCL IN NACL 400 MCG/100ML IV SOLN
0.4000 ug/kg/h | INTRAVENOUS | Status: DC
  Administered 2012-11-11: 0.7 ug/kg/h via INTRAVENOUS
  Filled 2012-11-11: qty 100

## 2012-11-11 MED ORDER — ACETAMINOPHEN 500 MG PO TABS
1000.0000 mg | ORAL_TABLET | Freq: Four times a day (QID) | ORAL | Status: AC
  Administered 2012-11-12 – 2012-11-16 (×17): 1000 mg via ORAL
  Filled 2012-11-11 (×18): qty 2

## 2012-11-11 MED ORDER — ASPIRIN EC 325 MG PO TBEC
325.0000 mg | DELAYED_RELEASE_TABLET | Freq: Every day | ORAL | Status: DC
  Filled 2012-11-11: qty 1

## 2012-11-11 MED ORDER — SODIUM CHLORIDE 0.9 % IV SOLN
INTRAVENOUS | Status: DC | PRN
  Administered 2012-11-11: 15:00:00 via INTRAVENOUS

## 2012-11-11 MED ORDER — ACETAMINOPHEN 160 MG/5ML PO SOLN
975.0000 mg | Freq: Four times a day (QID) | ORAL | Status: DC
  Administered 2012-11-12 (×2): 975 mg
  Filled 2012-11-11 (×2): qty 40.6

## 2012-11-11 MED ORDER — GLYCOPYRROLATE 0.2 MG/ML IJ SOLN
INTRAMUSCULAR | Status: DC | PRN
  Administered 2012-11-11: 0.2 mg via INTRAVENOUS

## 2012-11-11 MED ORDER — PROTAMINE SULFATE 10 MG/ML IV SOLN
INTRAVENOUS | Status: DC | PRN
  Administered 2012-11-11 (×5): 50 mg via INTRAVENOUS
  Administered 2012-11-11: 40 mg via INTRAVENOUS
  Administered 2012-11-11: 10 mg via INTRAVENOUS

## 2012-11-11 MED ORDER — DEXTROSE 5 % IV SOLN
1.5000 g | Freq: Two times a day (BID) | INTRAVENOUS | Status: AC
  Administered 2012-11-11 – 2012-11-13 (×4): 1.5 g via INTRAVENOUS
  Filled 2012-11-11 (×4): qty 1.5

## 2012-11-11 MED ORDER — DOPAMINE-DEXTROSE 3.2-5 MG/ML-% IV SOLN: 0.0000 ug/kg/min | INTRAVENOUS | Status: DC

## 2012-11-11 MED ORDER — SODIUM CHLORIDE 0.9 % IJ SOLN
3.0000 mL | Freq: Two times a day (BID) | INTRAMUSCULAR | Status: DC
  Administered 2012-11-12: 3 mL via INTRAVENOUS

## 2012-11-11 MED ORDER — POTASSIUM CHLORIDE 10 MEQ/50ML IV SOLN
10.0000 meq | Freq: Once | INTRAVENOUS | Status: AC
  Administered 2012-11-11: 10 meq via INTRAVENOUS

## 2012-11-11 MED ORDER — BISACODYL 5 MG PO TBEC
10.0000 mg | DELAYED_RELEASE_TABLET | Freq: Every day | ORAL | Status: DC
  Administered 2012-11-12 – 2012-11-18 (×4): 10 mg via ORAL
  Filled 2012-11-11 (×5): qty 2

## 2012-11-11 MED ORDER — ROCURONIUM BROMIDE 100 MG/10ML IV SOLN
INTRAVENOUS | Status: DC | PRN
  Administered 2012-11-11: 50 mg via INTRAVENOUS

## 2012-11-11 MED ORDER — METOPROLOL TARTRATE 12.5 MG HALF TABLET
12.5000 mg | ORAL_TABLET | Freq: Two times a day (BID) | ORAL | Status: DC
  Administered 2012-11-12: 12.5 mg via ORAL
  Filled 2012-11-11 (×7): qty 1

## 2012-11-11 MED ORDER — LACTATED RINGERS IV SOLN
INTRAVENOUS | Status: DC | PRN
  Administered 2012-11-11 (×2): via INTRAVENOUS

## 2012-11-11 MED ORDER — LACTATED RINGERS IV SOLN: INTRAVENOUS | Status: DC

## 2012-11-11 MED ORDER — MIDAZOLAM HCL 5 MG/5ML IJ SOLN
INTRAMUSCULAR | Status: DC | PRN
  Administered 2012-11-11: 3 mg via INTRAVENOUS
  Administered 2012-11-11: 2 mg via INTRAVENOUS
  Administered 2012-11-11: 3 mg via INTRAVENOUS
  Administered 2012-11-11: 5 mg via INTRAVENOUS

## 2012-11-11 MED ORDER — MIDAZOLAM HCL 2 MG/2ML IJ SOLN
2.0000 mg | INTRAMUSCULAR | Status: DC | PRN
  Administered 2012-11-12: 1 mg via INTRAVENOUS
  Filled 2012-11-11 (×2): qty 2

## 2012-11-11 MED ORDER — DEXMEDETOMIDINE HCL IN NACL 200 MCG/50ML IV SOLN: 0.1000 ug/kg/h | INTRAVENOUS | Status: DC

## 2012-11-11 MED ORDER — SODIUM CHLORIDE 0.9 % IV SOLN
INTRAVENOUS | Status: DC
  Filled 2012-11-11: qty 40

## 2012-11-11 MED ORDER — POTASSIUM CHLORIDE 10 MEQ/50ML IV SOLN
10.0000 meq | INTRAVENOUS | Status: AC
  Administered 2012-11-11 (×3): 10 meq via INTRAVENOUS

## 2012-11-11 MED ORDER — PHENYLEPHRINE HCL 10 MG/ML IJ SOLN
0.0000 ug/min | INTRAVENOUS | Status: DC
  Filled 2012-11-11: qty 2

## 2012-11-11 MED ORDER — VECURONIUM BROMIDE 10 MG IV SOLR
INTRAVENOUS | Status: DC | PRN
  Administered 2012-11-11 (×2): 10 mg via INTRAVENOUS

## 2012-11-11 MED ORDER — SODIUM CHLORIDE 0.9 % IR SOLN
Status: DC | PRN
  Administered 2012-11-11: 6000 mL

## 2012-11-11 MED ORDER — PROPOFOL 10 MG/ML IV BOLUS
INTRAVENOUS | Status: DC | PRN
  Administered 2012-11-11: 80 mg via INTRAVENOUS
  Administered 2012-11-11: 70 mg via INTRAVENOUS
  Administered 2012-11-11: 50 mg via INTRAVENOUS

## 2012-11-11 MED ORDER — ACETAMINOPHEN 10 MG/ML IV SOLN
1000.0000 mg | Freq: Once | INTRAVENOUS | Status: AC
  Administered 2012-11-11: 1000 mg via INTRAVENOUS
  Filled 2012-11-11: qty 100

## 2012-11-11 SURGICAL SUPPLY — 109 items
ADAPTER CARDIO PERF ANTE/RETRO (ADAPTER) ×4 IMPLANT
ATTRACTOMAT 16X20 MAGNETIC DRP (DRAPES) ×4 IMPLANT
BAG DECANTER FOR FLEXI CONT (MISCELLANEOUS) ×4 IMPLANT
BLADE STERNUM SYSTEM 6 (BLADE) ×4 IMPLANT
BLADE SURG 15 STRL LF DISP TIS (BLADE) IMPLANT
BLADE SURG 15 STRL SS (BLADE)
BLANKET WARM CARDIAC ADLT BAIR (MISCELLANEOUS) ×4 IMPLANT
CANISTER SUCTION 2500CC (MISCELLANEOUS) ×4 IMPLANT
CANNULA GUNDRY RCSP 15FR (MISCELLANEOUS) ×4 IMPLANT
CATH FOLEY 2WAY SLVR 30CC 20FR (CATHETERS) ×4 IMPLANT
CATH RETROPLEGIA CORONARY 14FR (CATHETERS) IMPLANT
CATH ROBINSON RED A/P 18FR (CATHETERS) ×4 IMPLANT
CAUTERY EYE LOW TEMP 1300F FIN (OPHTHALMIC RELATED) ×4 IMPLANT
CLIP FOGARTY SPRING 6M (CLIP) IMPLANT
CLOTH BEACON ORANGE TIMEOUT ST (SAFETY) ×4 IMPLANT
CONT SPEC 4OZ CLIKSEAL STRL BL (MISCELLANEOUS) ×12 IMPLANT
COVER SURGICAL LIGHT HANDLE (MISCELLANEOUS) ×8 IMPLANT
CRADLE DONUT ADULT HEAD (MISCELLANEOUS) ×4 IMPLANT
DERMABOND ADVANCED (GAUZE/BANDAGES/DRESSINGS) ×2
DERMABOND ADVANCED .7 DNX12 (GAUZE/BANDAGES/DRESSINGS) ×2 IMPLANT
DRAPE CARDIOVASCULAR INCISE (DRAPES) ×2
DRAPE SLUSH MACHINE 52X66 (DRAPES) ×4 IMPLANT
DRAPE SLUSH/WARMER DISC (DRAPES) IMPLANT
DRAPE SRG 135X102X78XABS (DRAPES) ×2 IMPLANT
DRSG COVADERM 4X14 (GAUZE/BANDAGES/DRESSINGS) ×4 IMPLANT
ELECT CAUTERY BLADE 6.4 (BLADE) IMPLANT
ELECT REM PT RETURN 9FT ADLT (ELECTROSURGICAL) ×8
ELECTRODE REM PT RTRN 9FT ADLT (ELECTROSURGICAL) ×4 IMPLANT
FELT TEFLON 6X6 (MISCELLANEOUS) ×4 IMPLANT
GLOVE BIO SURGEON STRL SZ 6.5 (GLOVE) ×21 IMPLANT
GLOVE BIO SURGEON STRL SZ7.5 (GLOVE) ×12 IMPLANT
GLOVE BIO SURGEON STRL SZ8 (GLOVE) ×4 IMPLANT
GLOVE BIO SURGEONS STRL SZ 6.5 (GLOVE) ×7
GLOVE BIOGEL PI IND STRL 7.0 (GLOVE) ×6 IMPLANT
GLOVE BIOGEL PI INDICATOR 7.0 (GLOVE) ×6
GOWN PREVENTION PLUS XLARGE (GOWN DISPOSABLE) ×12 IMPLANT
GOWN STRL NON-REIN LRG LVL3 (GOWN DISPOSABLE) ×28 IMPLANT
GRAFT WOVEN D/V 28DX30L (Vascular Products) ×4 IMPLANT
HEMOSTAT POWDER SURGIFOAM 1G (HEMOSTASIS) ×12 IMPLANT
HEMOSTAT SURGICEL 2X14 (HEMOSTASIS) IMPLANT
INSERT FOGARTY XLG (MISCELLANEOUS) IMPLANT
KIT BASIN OR (CUSTOM PROCEDURE TRAY) ×4 IMPLANT
KIT DRAINAGE VACCUM ASSIST (KITS) ×4 IMPLANT
KIT PAIN CUSTOM (MISCELLANEOUS) IMPLANT
KIT ROOM TURNOVER OR (KITS) ×4 IMPLANT
KIT SUCTION CATH 14FR (SUCTIONS) IMPLANT
LINE VENT (MISCELLANEOUS) ×4 IMPLANT
LOOP VESSEL MAXI BLUE (MISCELLANEOUS) ×8 IMPLANT
LOOP VESSEL MINI RED (MISCELLANEOUS) ×4 IMPLANT
MARKER GRAFT CORONARY BYPASS (MISCELLANEOUS) IMPLANT
MATRIX HEMOSTAT SURGIFLO (HEMOSTASIS) ×8 IMPLANT
NS IRRIG 1000ML POUR BTL (IV SOLUTION) ×28 IMPLANT
PACK OPEN HEART (CUSTOM PROCEDURE TRAY) ×4 IMPLANT
PAD ARMBOARD 7.5X6 YLW CONV (MISCELLANEOUS) ×8 IMPLANT
PENCIL BUTTON HOLSTER BLD 10FT (ELECTRODE) IMPLANT
SET CARDIOPLEGIA MPS 5001102 (MISCELLANEOUS) ×4 IMPLANT
SPONGE GAUZE 4X4 12PLY (GAUZE/BANDAGES/DRESSINGS) ×4 IMPLANT
SPONGE LAP 18X18 X RAY DECT (DISPOSABLE) ×4 IMPLANT
SPONGE LAP 4X18 X RAY DECT (DISPOSABLE) ×4 IMPLANT
STOPCOCK MORSE 400PSI 3WAY (MISCELLANEOUS) ×4 IMPLANT
SUCKER INTRACARDIAC WEIGHTED (SUCKER) ×4 IMPLANT
SUT ETHIBON 2 0 V 52N 30 (SUTURE) ×4 IMPLANT
SUT ETHIBON EXCEL 2-0 V-5 (SUTURE) IMPLANT
SUT ETHIBOND 2 0 SH (SUTURE)
SUT ETHIBOND 2 0 SH 36X2 (SUTURE) IMPLANT
SUT ETHIBOND 2 0 V4 (SUTURE) IMPLANT
SUT ETHIBOND 2 0V4 GREEN (SUTURE) IMPLANT
SUT ETHIBOND 4 0 RB 1 (SUTURE) IMPLANT
SUT ETHIBOND NAB MH 2-0 36IN (SUTURE) ×4 IMPLANT
SUT ETHIBOND V-5 VALVE (SUTURE) IMPLANT
SUT PROLENE 3 0 SH 1 (SUTURE) ×20 IMPLANT
SUT PROLENE 3 0 SH DA (SUTURE) ×36 IMPLANT
SUT PROLENE 4 0 RB 1 (SUTURE) ×78
SUT PROLENE 4 0 SH DA (SUTURE) ×20 IMPLANT
SUT PROLENE 4-0 RB1 .5 CRCL 36 (SUTURE) ×78 IMPLANT
SUT PROLENE 5 0 C 1 36 (SUTURE) ×12 IMPLANT
SUT PROLENE 6 0 C 1 30 (SUTURE) ×8 IMPLANT
SUT PROLENE 6 0 CC (SUTURE) ×40 IMPLANT
SUT SILK  1 MH (SUTURE) ×4
SUT SILK 1 MH (SUTURE) ×4 IMPLANT
SUT SILK 1 TIES 10X30 (SUTURE) ×4 IMPLANT
SUT SILK 2 0 (SUTURE) ×2
SUT SILK 2 0 SH CR/8 (SUTURE) ×12 IMPLANT
SUT SILK 2-0 18XBRD TIE 12 (SUTURE) ×2 IMPLANT
SUT SILK 3 0 SH CR/8 (SUTURE) ×4 IMPLANT
SUT SILK 4 0 (SUTURE) ×2
SUT SILK 4-0 18XBRD TIE 12 (SUTURE) ×2 IMPLANT
SUT STEEL 6MS V (SUTURE) ×8 IMPLANT
SUT STEEL SZ 6 DBL 3X14 BALL (SUTURE) ×4 IMPLANT
SUT TEM PAC WIRE 2 0 SH (SUTURE) ×16 IMPLANT
SUT VIC AB 1 CTX 36 (SUTURE) ×2
SUT VIC AB 1 CTX36XBRD ANBCTR (SUTURE) ×2 IMPLANT
SUT VIC AB 2-0 CT1 27 (SUTURE) ×2
SUT VIC AB 2-0 CT1 TAPERPNT 27 (SUTURE) ×2 IMPLANT
SUT VIC AB 2-0 CTX 27 (SUTURE) ×12 IMPLANT
SUT VIC AB 3-0 X1 27 (SUTURE) ×4 IMPLANT
SYR 10ML KIT SKIN ADHESIVE (MISCELLANEOUS) ×4 IMPLANT
SYSTEM SAHARA CHEST DRAIN ATS (WOUND CARE) ×4 IMPLANT
TAPE CLOTH SURG 4X10 WHT LF (GAUZE/BANDAGES/DRESSINGS) ×4 IMPLANT
TOWEL OR 17X24 6PK STRL BLUE (TOWEL DISPOSABLE) ×4 IMPLANT
TOWEL OR 17X26 10 PK STRL BLUE (TOWEL DISPOSABLE) ×4 IMPLANT
TRAY CATH LUMEN 1 20CM STRL (SET/KITS/TRAYS/PACK) ×4 IMPLANT
TRAY FOLEY IC TEMP SENS 14FR (CATHETERS) ×4 IMPLANT
TUBE CONNECTING 12'X1/4 (SUCTIONS) ×1
TUBE CONNECTING 12X1/4 (SUCTIONS) ×3 IMPLANT
TUBING ART PRESS 48 MALE/FEM (TUBING) ×8 IMPLANT
UNDERPAD 30X30 INCONTINENT (UNDERPADS AND DIAPERS) ×4 IMPLANT
WATER STERILE IRR 1000ML POUR (IV SOLUTION) ×8 IMPLANT
YANKAUER SUCT BULB TIP NO VENT (SUCTIONS) ×8 IMPLANT

## 2012-11-11 NOTE — Transfer of Care (Signed)
Immediate Anesthesia Transfer of Care Note  Patient: [name]  Procedure(s) Performed: Procedure(s) with comments: BENTALL PROCEDURE (N/A) - *CIRC ARREST*  INTRAOPERATIVE TRANSESOPHAGEAL ECHOCARDIOGRAM (N/A)  Patient Location: SICU  Anesthesia Type:General  Level of Consciousness: Patient remains intubated per anesthesia plan  Airway & Oxygen Therapy: Patient remains intubated per anesthesia plan and Patient placed on Ventilator (see vital sign flow sheet for setting)  Post-op Assessment: Report given to PACU RN and Post -op Vital signs reviewed and stable  Post vital signs: Reviewed and stable  Complications: No apparent anesthesia complications

## 2012-11-11 NOTE — Brief Op Note (Signed)
   [address]       [name] [address]             [phone]   11/08/2012 - 11/11/2012  1:02 PM  PATIENT:  [name]  79 y.o. female  PRE-OPERATIVE DIAGNOSIS:  ASCENDING AORTIC ANEURYSM DISSECTION  POST-OPERATIVE DIAGNOSIS:  SAME PROCEDURE:  Procedure(s): ASCENDING AORTIC ANEURYSM/DISSECTION REPAIR WITH #30 HEMASHIELD AORTO-AORTIC ANASTAMOSIS INTRAOPERATIVE TRANSESOPHAGEAL ECHOCARDIOGRAM  SURGEON:  Surgeon(s): Kerin Perna, MD  PHYSICIAN ASSISTANT: Anju Sereno PA-C  ANESTHESIA:   general  PATIENT CONDITION:  ICU - intubated and hemodynamically stable.  PRE-OPERATIVE WEIGHT: 65kg  COMPLICATIONS: NO KNOWN

## 2012-11-11 NOTE — Progress Notes (Signed)
S/p replacement of ascending aorta  Intubated, sedated- plan to keep intubated overnight  BP 122/68  Pulse 89  Temp(Src) 98.6 F (37 C) (Oral)  Resp 12  Ht 5\' 1"  (1.549 m)  Wt 145 lb 1 oz (65.8 kg)  BMI 27.42 kg/m2  SpO2 96%   Intake/Output Summary (Last 24 hours) at 11/11/12 1918 Last data filed at 11/11/12 1900  Gross per 24 hour  Intake 6280.9 ml  Output   5790 ml  Net  490.9 ml    Down to 60% FiO2 on vent  CI 1.9 on dopamine  Continue current care

## 2012-11-11 NOTE — Progress Notes (Signed)
The patient was examined and preop studies reviewed. There has been no change from the prior exam and the patient is ready for surgery.  Plan replacement of ascending aorta on A [place] today

## 2012-11-11 NOTE — Progress Notes (Signed)
CI 1.7 at 1625. Albumin 5 % 250 ml IV hung.

## 2012-11-11 NOTE — Anesthesia Preprocedure Evaluation (Addendum)
Anesthesia Evaluation  Patient identified by MRN, date of birth, ID band Patient awake    Reviewed: Allergy & Precautions, H&P , NPO status , Patient's Chart, lab work & pertinent test results  History of Anesthesia Complications (+) PONV  Airway Mallampati: II TM Distance: >3 FB Neck ROM: Full    Dental  (+) Loose and Dental Advisory Given   Pulmonary neg pulmonary ROS,          Cardiovascular hypertension, + dysrhythmias Atrial Fibrillation Rhythm:Regular Rate:Normal     Neuro/Psych    GI/Hepatic negative GI ROS, Neg liver ROS,   Endo/Other  negative endocrine ROS  Renal/GU negative Renal ROS     Musculoskeletal   Abdominal   Peds  Hematology   Anesthesia Other Findings   Reproductive/Obstetrics                         Anesthesia Physical Anesthesia Plan  ASA: IV  Anesthesia Plan: General   Post-op Pain Management:    Induction: Intravenous  Airway Management Planned: Oral ETT  Additional Equipment: Arterial line and PA Cath  Intra-op Plan:   Post-operative Plan: Post-operative intubation/ventilation  Informed Consent: I have reviewed the patients History and Physical, chart, labs and discussed the procedure including the risks, benefits and alternatives for the proposed anesthesia with the patient or authorized representative who has indicated his/her understanding and acceptance.   Dental advisory given  Plan Discussed with: CRNA, Anesthesiologist and Surgeon  Anesthesia Plan Comments:         Anesthesia Quick Evaluation

## 2012-11-11 NOTE — Progress Notes (Signed)
Respiratory therapy note-Recruitment maneuver done and fio2 increased 80%.

## 2012-11-12 ENCOUNTER — Inpatient Hospital Stay (HOSPITAL_COMMUNITY): Payer: Medicare PPO

## 2012-11-12 ENCOUNTER — Encounter (HOSPITAL_COMMUNITY): Payer: Self-pay | Admitting: Cardiothoracic Surgery

## 2012-11-12 LAB — GLUCOSE, CAPILLARY
Glucose-Capillary: 102 mg/dL — ABNORMAL HIGH (ref 70–99)
Glucose-Capillary: 102 mg/dL — ABNORMAL HIGH (ref 70–99)
Glucose-Capillary: 108 mg/dL — ABNORMAL HIGH (ref 70–99)
Glucose-Capillary: 113 mg/dL — ABNORMAL HIGH (ref 70–99)
Glucose-Capillary: 114 mg/dL — ABNORMAL HIGH (ref 70–99)
Glucose-Capillary: 116 mg/dL — ABNORMAL HIGH (ref 70–99)
Glucose-Capillary: 119 mg/dL — ABNORMAL HIGH (ref 70–99)
Glucose-Capillary: 122 mg/dL — ABNORMAL HIGH (ref 70–99)
Glucose-Capillary: 135 mg/dL — ABNORMAL HIGH (ref 70–99)
Glucose-Capillary: 144 mg/dL — ABNORMAL HIGH (ref 70–99)
Glucose-Capillary: 156 mg/dL — ABNORMAL HIGH (ref 70–99)
Glucose-Capillary: 87 mg/dL (ref 70–99)
Glucose-Capillary: 88 mg/dL (ref 70–99)
Glucose-Capillary: 90 mg/dL (ref 70–99)
Glucose-Capillary: 91 mg/dL (ref 70–99)
Glucose-Capillary: 93 mg/dL (ref 70–99)

## 2012-11-12 LAB — POCT I-STAT 3, ART BLOOD GAS (G3+)
Acid-Base Excess: 1 mmol/L (ref 0.0–2.0)
Acid-base deficit: 2 mmol/L (ref 0.0–2.0)
Acid-base deficit: 2 mmol/L (ref 0.0–2.0)
Bicarbonate: 23.5 mEq/L (ref 20.0–24.0)
Bicarbonate: 23 mEq/L (ref 20.0–24.0)
Bicarbonate: 22.5 mEq/L (ref 20.0–24.0)
O2 Saturation: 95 %
O2 Saturation: 93 %
O2 Saturation: 94 %
Patient temperature: 36.6
Patient temperature: 36.9
Patient temperature: 36.8
TCO2: 24 mmol/L (ref 0–100)
TCO2: 24 mmol/L (ref 0–100)
TCO2: 24 mmol/L (ref 0–100)
pCO2 arterial: 27.9 mmHg — ABNORMAL LOW (ref 35.0–45.0)
pCO2 arterial: 37 mmHg (ref 35.0–45.0)
pCO2 arterial: 36.6 mmHg (ref 35.0–45.0)
pH, Arterial: 7.531 — ABNORMAL HIGH (ref 7.350–7.450)
pH, Arterial: 7.401 (ref 7.350–7.450)
pH, Arterial: 7.396 (ref 7.350–7.450)
pO2, Arterial: 65 mmHg — ABNORMAL LOW (ref 80.0–100.0)
pO2, Arterial: 67 mmHg — ABNORMAL LOW (ref 80.0–100.0)
pO2, Arterial: 70 mmHg — ABNORMAL LOW (ref 80.0–100.0)

## 2012-11-12 LAB — CBC
HCT: 33.6 % — ABNORMAL LOW (ref 36.0–46.0)
HCT: 33.4 % — ABNORMAL LOW (ref 36.0–46.0)
Hemoglobin: 11.9 g/dL — ABNORMAL LOW (ref 12.0–15.0)
Hemoglobin: 11.4 g/dL — ABNORMAL LOW (ref 12.0–15.0)
MCH: 29.4 pg (ref 26.0–34.0)
MCH: 29.2 pg (ref 26.0–34.0)
MCHC: 35.4 g/dL (ref 30.0–36.0)
MCHC: 34.1 g/dL (ref 30.0–36.0)
MCV: 83 fL (ref 78.0–100.0)
MCV: 85.6 fL (ref 78.0–100.0)
Platelets: 252 10*3/uL (ref 150–400)
Platelets: 277 10*3/uL (ref 150–400)
RBC: 4.05 MIL/uL (ref 3.87–5.11)
RBC: 3.9 MIL/uL (ref 3.87–5.11)
RDW: 14.5 % (ref 11.5–15.5)
RDW: 14.9 % (ref 11.5–15.5)
WBC: 13.6 10*3/uL — ABNORMAL HIGH (ref 4.0–10.5)
WBC: 16.9 10*3/uL — ABNORMAL HIGH (ref 4.0–10.5)

## 2012-11-12 LAB — CREATININE, SERUM
Creatinine, Ser: 0.76 mg/dL (ref 0.50–1.10)
GFR calc Af Amer: 89 mL/min — ABNORMAL LOW (ref >90)
GFR calc non Af Amer: 76 mL/min — ABNORMAL LOW (ref >90)

## 2012-11-12 LAB — POCT I-STAT, CHEM 8
BUN: 8 mg/dL (ref 6–23)
Calcium, Ion: 1.12 mmol/L — ABNORMAL LOW (ref 1.13–1.30)
Chloride: 104 mEq/L (ref 96–112)
Creatinine, Ser: 0.8 mg/dL (ref 0.50–1.10)
Glucose, Bld: 118 mg/dL — ABNORMAL HIGH (ref 70–99)
HCT: 34 % — ABNORMAL LOW (ref 36.0–46.0)
Hemoglobin: 11.6 g/dL — ABNORMAL LOW (ref 12.0–15.0)
Potassium: 3.9 mEq/L (ref 3.5–5.1)
Sodium: 142 mEq/L (ref 135–145)
TCO2: 28 mmol/L (ref 0–100)

## 2012-11-12 LAB — PREPARE PLATELET PHERESIS
Unit division: 0
Unit division: 0

## 2012-11-12 LAB — MAGNESIUM
Magnesium: 2.7 mg/dL — ABNORMAL HIGH (ref 1.5–2.5)
Magnesium: 2.3 mg/dL (ref 1.5–2.5)

## 2012-11-12 LAB — PREPARE FRESH FROZEN PLASMA: Unit division: 0

## 2012-11-12 LAB — BASIC METABOLIC PANEL
BUN: 9 mg/dL (ref 6–23)
CO2: 23 mEq/L (ref 19–32)
Calcium: 7.6 mg/dL — ABNORMAL LOW (ref 8.4–10.5)
Chloride: 108 mEq/L (ref 96–112)
Creatinine, Ser: 0.64 mg/dL (ref 0.50–1.10)
GFR calc Af Amer: >90 mL/min (ref >90)
GFR calc non Af Amer: 81 mL/min — ABNORMAL LOW (ref >90)
Glucose, Bld: 107 mg/dL — ABNORMAL HIGH (ref 70–99)
Potassium: 3.6 mEq/L (ref 3.5–5.1)
Sodium: 142 mEq/L (ref 135–145)

## 2012-11-12 MED ORDER — TRAMADOL HCL 50 MG PO TABS
50.0000 mg | ORAL_TABLET | Freq: Four times a day (QID) | ORAL | Status: DC | PRN
  Administered 2012-11-12 – 2012-11-19 (×8): 50 mg via ORAL
  Filled 2012-11-12 (×8): qty 1

## 2012-11-12 MED ORDER — VANCOMYCIN HCL IN DEXTROSE 1-5 GM/200ML-% IV SOLN
1000.0000 mg | Freq: Once | INTRAVENOUS | Status: AC
  Administered 2012-11-12: 1000 mg via INTRAVENOUS
  Filled 2012-11-12: qty 200

## 2012-11-12 MED ORDER — POTASSIUM CHLORIDE 10 MEQ/50ML IV SOLN
10.0000 meq | INTRAVENOUS | Status: AC
  Administered 2012-11-12 (×3): 10 meq via INTRAVENOUS
  Filled 2012-11-12: qty 100

## 2012-11-12 MED ORDER — ASPIRIN EC 81 MG PO TBEC
81.0000 mg | DELAYED_RELEASE_TABLET | Freq: Every day | ORAL | Status: DC
  Administered 2012-11-12 – 2012-11-19 (×8): 81 mg via ORAL
  Filled 2012-11-12 (×8): qty 1

## 2012-11-12 MED ORDER — INSULIN ASPART 100 UNIT/ML ~~LOC~~ SOLN
0.0000 [IU] | SUBCUTANEOUS | Status: DC
  Administered 2012-11-12 – 2012-11-13 (×5): 2 [IU] via SUBCUTANEOUS

## 2012-11-12 MED ORDER — AMIODARONE HCL 100 MG PO TABS
100.0000 mg | ORAL_TABLET | Freq: Every day | ORAL | Status: DC
  Administered 2012-11-12 – 2012-11-16 (×4): 100 mg via ORAL
  Filled 2012-11-12 (×5): qty 1

## 2012-11-12 MED ORDER — FUROSEMIDE 10 MG/ML IJ SOLN
20.0000 mg | Freq: Two times a day (BID) | INTRAMUSCULAR | Status: DC
  Administered 2012-11-12 (×2): 20 mg via INTRAVENOUS
  Filled 2012-11-12 (×4): qty 2

## 2012-11-12 MED ORDER — HYDRALAZINE HCL 20 MG/ML IJ SOLN
INTRAMUSCULAR | Status: AC
  Filled 2012-11-12: qty 1

## 2012-11-12 MED FILL — Lidocaine HCl IV Inj 20 MG/ML: INTRAVENOUS | Qty: 10 | Status: AC

## 2012-11-12 MED FILL — Heparin Sodium (Porcine) Inj 1000 Unit/ML: INTRAMUSCULAR | Qty: 10 | Status: AC

## 2012-11-12 MED FILL — Sodium Chloride IV Soln 0.9%: INTRAVENOUS | Qty: 1000 | Status: AC

## 2012-11-12 MED FILL — Magnesium Sulfate Inj 50%: INTRAMUSCULAR | Qty: 10 | Status: AC

## 2012-11-12 MED FILL — Heparin Sodium (Porcine) Inj 1000 Unit/ML: INTRAMUSCULAR | Qty: 30 | Status: AC

## 2012-11-12 MED FILL — Mannitol IV Soln 20%: INTRAVENOUS | Qty: 500 | Status: AC

## 2012-11-12 MED FILL — Potassium Chloride Inj 2 mEq/ML: INTRAVENOUS | Qty: 40 | Status: AC

## 2012-11-12 MED FILL — Sodium Chloride Irrigation Soln 0.9%: Qty: 3000 | Status: AC

## 2012-11-12 MED FILL — Electrolyte-R (PH 7.4) Solution: INTRAVENOUS | Qty: 6000 | Status: AC

## 2012-11-12 MED FILL — Sodium Bicarbonate IV Soln 8.4%: INTRAVENOUS | Qty: 50 | Status: AC

## 2012-11-12 NOTE — Progress Notes (Signed)
1 Day Post-Op Procedure(s) (LRB): BENTALL PROCEDURE (N/A) INTRAOPERATIVE TRANSESOPHAGEAL ECHOCARDIOGRAM (N/A) Subjective: Extubated neuro intact Pulses intact Mild edema Objective: Vital signs in last 24 hours: Temp:  [93.9 F (34.4 C)-99.1 F (37.3 C)] 98.2 F (36.8 C) (04/01 0800) Pulse Rate:  [66-100] 89 (04/01 0800) Cardiac Rhythm:  [-] Atrial paced (04/01 0300) Resp:  [11-26] 19 (04/01 0800) BP: (86-114)/(54-69) 101/59 mmHg (04/01 0800) SpO2:  [90 %-99 %] 96 % (04/01 0800) Arterial Line BP: (101-133)/(52-83) 119/62 mmHg (04/01 0800) FiO2 (%):  [40 %-80 %] 40 % (04/01 0600) Weight:  [145 lb 1 oz (65.8 kg)-158 lb 11.7 oz (72 kg)] 158 lb 11.7 oz (72 kg) (04/01 0500)  Hemodynamic parameters for last 24 hours: PAP: (26-38)/(13-23) 27/14 mmHg CO:  [2.6 L/min-4.5 L/min] 4.5 L/min CI:  [1.6 L/min/m2-2.7 L/min/m2] 2.7 L/min/m2  Intake/Output from previous day: 03/31 0701 - 04/01 0700 In: 7955.3 [I.V.:3993.3; Blood:2057; NG/GT:120; IV Piggyback:1785] Out: 8085 [Urine:5275; Emesis/NG output:100; Blood:2400; Chest Tube:310] Intake/Output this shift: Total I/O In: 90 [I.V.:40; IV Piggyback:50] Out: 85 [Urine:75; Chest Tube:10]  Lungs clear Sinus brady  Lab Results:  Recent Labs  11/11/12 2145 11/11/12 2149 11/12/12 0400  WBC 11.6*  --  13.6*  HGB 12.1 11.9* 11.9*  HCT 35.3* 35.0* 33.6*  PLT 253  --  252   BMET:  Recent Labs  11/11/12 0550  11/11/12 2149 11/12/12 0400  NA 141  < > 146* 142  K 3.4*  < > 4.1 3.6  CL 105  --  111 108  CO2 27  --   --  23  GLUCOSE 115*  < > 129* 107*  BUN 12  --  8 9  CREATININE 0.82  < > 0.60 0.64  CALCIUM 8.8  --   --  7.6*  < > = values in this interval not displayed.  PT/INR:  Recent Labs  11/11/12 1600  LABPROT 18.9*  INR 1.64*   ABG    Component Value Date/Time   PHART 7.396 11/12/2012 0743   HCO3 22.5 11/12/2012 0743   TCO2 24 11/12/2012 0743   ACIDBASEDEF 2.0 11/12/2012 0743   O2SAT 94.0 11/12/2012 0743   CBG (last  3)   Recent Labs  11/12/12 0602 11/12/12 0656 11/12/12 0740  GLUCAP 144* 108* 93    Assessment/Plan: S/P Procedure(s) (LRB): BENTALL PROCEDURE (N/A) INTRAOPERATIVE TRANSESOPHAGEAL ECHOCARDIOGRAM (N/A) See progression orders   LOS: 4 days    [name],[name] 11/12/2012

## 2012-11-12 NOTE — Progress Notes (Signed)
TCTS DAILY PROGRESS NOTE                   [address]            [name] [address]          [phone]      1 Day Post-Op Procedure(s) (LRB): BENTALL PROCEDURE (N/A) INTRAOPERATIVE TRANSESOPHAGEAL ECHOCARDIOGRAM (N/A)  Total Length of Stay:  LOS: 4 days   Subjective: Awake, alert, extubated.  No complaints.  Comfortable, breathing stable.   Objective: Vital signs in last 24 hours: Temp:  [93.9 F (34.4 C)-99.1 F (37.3 C)] 98.4 F (36.9 C) (04/01 0700) Pulse Rate:  [66-100] 90 (04/01 0700) Cardiac Rhythm:  [-] Atrial paced (04/01 0300) Resp:  [11-26] 21 (04/01 0700) BP: (86-114)/(54-69) 86/64 mmHg (04/01 0700) SpO2:  [90 %-99 %] 93 % (04/01 0700) Arterial Line BP: (101-133)/(52-83) 103/56 mmHg (04/01 0700) FiO2 (%):  [40 %-80 %] 40 % (04/01 0600) Weight:  [145 lb 1 oz (65.8 kg)-158 lb 11.7 oz (72 kg)] 158 lb 11.7 oz (72 kg) (04/01 0500)  Filed Weights   11/11/12 0440 11/11/12 1600 11/12/12 0500  Weight: 145 lb 1 oz (65.8 kg) 145 lb 1 oz (65.8 kg) 158 lb 11.7 oz (72 kg)  PRE-OPERATIVE WEIGHT: 65kg   Weight change: 0 lb (0 kg)   Hemodynamic parameters for last 24 hours: PAP: (26-38)/(13-23) 27/14 mmHg CO:  [2.6 L/min-4.5 L/min] 4.5 L/min CI:  [1.6 L/min/m2-2.7 L/min/m2] 2.7 L/min/m2  Intake/Output from previous day: 03/31 0701 - 04/01 0700 In: 7955.3 [I.V.:3993.3; Blood:2057; NG/GT:120; IV Piggyback:1785] Out: 8085 [Urine:5275; Emesis/NG output:100; Blood:2400; Chest Tube:310]  CBGs 91-170    Current Meds: Scheduled Meds: . acetaminophen  1,000 mg Oral Q6H   Or  . acetaminophen (TYLENOL) oral liquid 160 mg/5 mL  975 mg Per Tube Q6H  . aminocaproic acid (AMICAR) for OHS   Intravenous To OR  . amiodarone  100 mg Oral Daily  . aspirin EC  325 mg Oral Daily   Or  . aspirin  324 mg Per Tube Daily  . bisacodyl  10 mg Oral Daily   Or  . bisacodyl  10 mg Rectal Daily  . cefUROXime (ZINACEF)  IV  1.5 g Intravenous Q12H  . docusate sodium   200 mg Oral Daily  . insulin regular  0-10 Units Intravenous TID WC  . metoprolol tartrate  12.5 mg Oral BID   Or  . metoprolol tartrate  12.5 mg Per Tube BID  . [START ON 11/13/2012] pantoprazole  40 mg Oral Daily  . potassium chloride  10 mEq Intravenous Q1 Hr x 3  . sodium chloride  3 mL Intravenous Q12H   Continuous Infusions: . sodium chloride 20 mL/hr at 11/11/12 1600  . sodium chloride    . sodium chloride 250 mL (11/12/12 0520)  . dexmedetomidine Stopped (11/12/12 0600)  . DOPamine Stopped (11/12/12 0400)  . insulin (NOVOLIN-R) infusion 1.9 Units/hr (11/12/12 0700)  . lactated ringers 20 mL/hr at 11/11/12 2000  . nitroGLYCERIN Stopped (11/12/12 0000)  . phenylephrine (NEO-SYNEPHRINE) Adult infusion Stopped (11/11/12 1655)   PRN Meds:.albumin human, influenza  inactive virus vaccine, metoprolol, midazolam, morphine injection, ondansetron (ZOFRAN) IV, oxyCODONE, pneumococcal 23 valent vaccine, sodium chloride   Physical Exam: General appearance: alert, cooperative and no distress Heart: regular rate and rhythm, paced at 90 Lungs: Slightly decreased BS in bases Extremities: No significant LE edema Wound: Dressed and dry    Lab Results: CBC: Recent Labs  11/11/12 2145 11/11/12 2149 11/12/12 0400  WBC 11.6*  --  13.6*  HGB 12.1 11.9* 11.9*  HCT 35.3* 35.0* 33.6*  PLT 253  --  252   BMET:  Recent Labs  11/11/12 0550  11/11/12 2149 11/12/12 0400  NA 141  < > 146* 142  K 3.4*  < > 4.1 3.6  CL 105  --  111 108  CO2 27  --   --  23  GLUCOSE 115*  < > 129* 107*  BUN 12  --  8 9  CREATININE 0.82  < > 0.60 0.64  CALCIUM 8.8  --   --  7.6*  < > = values in this interval not displayed.  PT/INR:  Recent Labs  11/11/12 1600  LABPROT 18.9*  INR 1.64*   Radiology:  PORTABLE CHEST - 1 VIEW  Comparison: November 11, 2012.  Findings: No significant change is seen involving position of  endotracheal tube, left-sided chest tube or right internal jugular Swan-Ganz  catheter. Sternotomy wires are noted. No pneumothorax is noted. Left basilar opacity is unchanged compared to prior exam.  IMPRESSION:  No pneumothorax seen. Left chest tube remains in place. No change in mild left basilar opacity concerning for atelectasis or effusion.    Dg Chest Portable 1 View  11/11/2012  *RADIOLOGY REPORT*  Clinical Data: Status post thoracic surgery  PORTABLE CHEST - 1 VIEW  Comparison: November 08, 2012.  Findings: Sternotomy wires are noted.  Endotracheal tube is in grossly good position with tip several centimeters above the carina.  Right internal jugular Swan-Ganz catheter is noted with tip in right main pulmonary artery. Nasogastric tube is seen coiled within the stomach.  Left-sided chest tube is noted without pneumothorax.  Left basilar opacity is noted concerning for effusion or atelectasis.  The right lung is clear.  IMPRESSION: Surgical changes as described above.  No pneumothorax is noted. Left basilar opacity is noted concerning for atelectasis or effusion.   Original Report Authenticated By: Lupita Raider.,  M.D.      Assessment/Plan: S/P Procedure(s) (LRB): BENTALL PROCEDURE (N/A) INTRAOPERATIVE TRANSESOPHAGEAL ECHOCARDIOGRAM (N/A) CV- paced at 90.  SBPs low normal.  Will probably need to hold beta blocker for now and watch.  Off all pressors. Mobilize, d/c lines and CTs. Diurese. D/c insulin gtt and start low dose Levemir (no h/o DM). Routine POD #1 progression.   Angelee Bahr H 11/12/2012 7:54 AM

## 2012-11-12 NOTE — Progress Notes (Signed)
T. CTS p.m. Rounds  Status post ascending aortic replacement for dissection A pacing stable blood pressure, walked in the hallway 100 feet P.m. labs stable Urine outputadequate

## 2012-11-12 NOTE — Anesthesia Postprocedure Evaluation (Signed)
  Anesthesia Post-op Note  Patient: [name]  Procedure(s) Performed: Procedure(s) with comments: BENTALL PROCEDURE (N/A) - *CIRC ARREST*  INTRAOPERATIVE TRANSESOPHAGEAL ECHOCARDIOGRAM (N/A)  Patient Location: SICU  Anesthesia Type:General  Level of Consciousness: Sedated  Post-op Vital Signs:mild  Complications: none

## 2012-11-12 NOTE — Progress Notes (Signed)
Subjective:  No complaints  Objective:  Vital Signs in the last 24 hours: Temp:  [93.9 F (34.4 C)-99.1 F (37.3 C)] 98.2 F (36.8 C) (04/01 0800) Pulse Rate:  [66-100] 89 (04/01 0800) Resp:  [11-26] 19 (04/01 0800) BP: (86-114)/(54-69) 101/59 mmHg (04/01 0800) SpO2:  [90 %-99 %] 96 % (04/01 0800) Arterial Line BP: (101-133)/(52-83) 119/62 mmHg (04/01 0800) FiO2 (%):  [40 %-80 %] 40 % (04/01 0600) Weight:  [65.8 kg (145 lb 1 oz)-72 kg (158 lb 11.7 oz)] 72 kg (158 lb 11.7 oz) (04/01 0500)  Intake/Output from previous day: 03/31 0701 - 04/01 0700 In: 7955.3 [I.V.:3993.3; Blood:2057; NG/GT:120; IV Piggyback:1785] Out: 8085 [Urine:5275; Emesis/NG output:100; Blood:2400; Chest Tube:310] Intake/Output from this shift: Total I/O In: 90 [I.V.:40; IV Piggyback:50] Out: 85 [Urine:75; Chest Tube:10] Lab Results:  Recent Labs  11/11/12 2145 11/11/12 2149 11/12/12 0400  WBC 11.6*  --  13.6*  HGB 12.1 11.9* 11.9*  PLT 253  --  252    Recent Labs  11/11/12 0550  11/11/12 2149 11/12/12 0400  NA 141  < > 146* 142  K 3.4*  < > 4.1 3.6  CL 105  --  111 108  CO2 27  --   --  23  GLUCOSE 115*  < > 129* 107*  BUN 12  --  8 9  CREATININE 0.82  < > 0.60 0.64  < > = values in this interval not displayed. No results found for this basename: TROPONINI, CK, MB,  in the last 72 hours Hepatic Function Panel  Recent Labs  11/10/12 0630  PROT 7.7  ALBUMIN 2.8*  AST 21  ALT 30  ALKPHOS 83  BILITOT 0.6   Assessment/Plan:  Active Problems:   Intramural aortic hematoma  Aortic root repair  Discussed with Dr. Maren Beach Progressing well Afib - holding coumadin (prior intramural hematoma) will discuss timing of restart.   Will follow    LOS: 4 days    Jakyra Kenealy 11/12/2012, 8:31 AM

## 2012-11-12 NOTE — Progress Notes (Signed)
Reported off to oncoming nurse, Henrene Pastor. Safety maintained. No acute distress noted.

## 2012-11-12 NOTE — Care Management Note (Signed)
    Page 1 of 1   11/19/2012     4:21:42 PM   CARE MANAGEMENT NOTE 11/19/2012  Patient:  [name],[name]   Account Number:  [id number]  Date Initiated:  11/12/2012  Documentation initiated by:  Avie Arenas  Subjective/Objective Assessment:   Post op ascending aorta repair on 11-11-12.     Action/Plan:   Anticipated DC Date:  11/19/2012   Anticipated DC Plan:  SKILLED NURSING FACILITY  In-house referral  Clinical Social Worker      DC Planning Services  CM consult      Choice offered to / List presented to:             Status of service:  Completed, signed off Medicare Important Message given?   (If response is "NO", the following Medicare IM given date fields will be blank) Date Medicare IM given:   Date Additional Medicare IM given:    Discharge Disposition:  SKILLED NURSING FACILITY  Per UR Regulation:  Reviewed for med. necessity/level of care/duration of stay  If discussed at Long Length of Stay Meetings, dates discussed:   11/19/2012    Comments:  Contact:  Smith,Arme Daughter [phone]                 [facility] Daughter [phone]  11/19/12 Donavin Audino,RN,BSN [phone] PT DISCHARGING TO SNF TODAY, PER CSW ARRANGEMENTS.  11-12-12 10:30am Johny Shears - [phone] Talked with daughter, Arme,  patient lives with her.  Was independent prior to admission.  Daughter works.  looking at [employer] assistance and aide to cover while she works. Offered option of rehab - CIR or SNF for ST prior to going home. Daughter thought that was a great idea and would like to talk with SW.  Referral made.   Will need PT/OT when able.

## 2012-11-12 NOTE — Progress Notes (Signed)
BiPap initiated with patient 12/5 6lpm bleed in.  Patient wore approximately 5 minutes and removed stating she could not tolerate.  Refused any attempts to reapply.  Nasal cannula restarted at 6 lpm.  RN aware.

## 2012-11-12 NOTE — Progress Notes (Addendum)
Clinical Social Work Department CLINICAL SOCIAL WORK PLACEMENT NOTE 11/12/2012  Patient:  [name],[name]  Account Number:  [id number] Admit date:  11/08/2012  Clinical Social Worker:  Salomon Fick, LCSW  Date/time:  11/12/2012 03:50 PM  Clinical Social Work is seeking post-discharge placement for this patient at the following level of care:   SKILLED NURSING   (*CSW will update this form in Epic as items are completed)   11/12/2012  Patient/family provided with [facility] Department of Clinical Social Work's list of facilities offering this level of care within the geographic area requested by the patient (or if unable, by the patient's family).  11/12/2012  Patient/family informed of their freedom to choose among providers that offer the needed level of care, that participate in Medicare, Medicaid or managed care program needed by the patient, have an available bed and are willing to accept the patient.  11/12/2012  Patient/family informed of [facility]' ownership interest in [facility], as well as of the fact that they are under no obligation to receive care at this facility.  PASARR submitted to EDS on 11/12/2012 PASARR number received from EDS on 11/12/2012  FL2 transmitted to all facilities in geographic area requested by pt/family on  11/12/2012 FL2 transmitted to all facilities within larger geographic area on 11/12/2012  Patient informed that his/her managed care company has contracts with or will negotiate with  certain facilities, including the following:     Patient/family informed of bed offers received:  11/18/2012 Patient chooses bed at  [name] Physician recommends and patient chooses bed at    Patient to be transferred to  on   Patient to be transferred to facility by   The following physician request were entered in Epic:   Additional Comments:

## 2012-11-12 NOTE — Procedures (Signed)
Extubation Procedure Note  Patient Details:   Name: [name] DOB: 13-Dec-1927 MRN: [id number]   Airway Documentation:   Patient extubated to 4 lpm nasal cannula.  VC 900 ml, NIF -30.  Patient able to breathe around deflated cuff and vocalize post procedure.  Tolerated well, no complications.   Evaluation  O2 sats: stable throughout Complications: No apparent complications Patient did tolerate procedure well. Bilateral Breath Sounds: Clear   Yes  [name], Aloha Gell 11/12/2012, 6:43 AM

## 2012-11-12 NOTE — Progress Notes (Signed)
Dr. Donata Clay called for update. Hemodynamics reported. Instructed to begin weaning patient from ventilator.

## 2012-11-12 NOTE — Progress Notes (Signed)
Clinical Social Work Department BRIEF PSYCHOSOCIAL ASSESSMENT 11/12/2012  Patient:  [name],[name]     Account Number:  [id number]     Admit date:  11/08/2012  Clinical Social Worker:  Madaline Guthrie  Date/Time:  11/12/2012 03:40 PM  Referred by:  Care Management  Date Referred:  11/12/2012 Referred for  SNF Placement   Other Referral:   Interview type:  Family Other interview type:    PSYCHOSOCIAL DATA Living Status:  WITH ADULT CHILDREN Admitted from facility:   Level of care:   Primary support name:  [name] [name] Primary support relationship to patient:  CHILD, ADULT Degree of support available:   [name] [name] [phone]  [name] [name] [phone]    CURRENT CONCERNS Current Concerns  None Noted   Other Concerns:    SOCIAL WORK ASSESSMENT / PLAN CSW met with patient, patient's daughter [name] [name] [phone]) and grandson who came to visit pt. Prior to hospitalization, pt lived with daughter [name] [name]) on second floor apartment here in [place]. Daughter asked CSW questions about Medicare and the SNF process. Family believes that SNF would be best for patient until she fully recovers because if she discharged home she would have a hard time getting up the stairs to the apartment. CSW talked to family about SNF process and will present family with bed offers once received. Patient has a large degree of support available (lots of children and grandchildren) that will be able to assist her. Pts daughter stated that she hopes the SNF patient is discharged to will be as nice as [facility]. Family was pleased with hospital care and services. Pt and family were engaging and very kind.   Assessment/plan status:  Other - See comment Other assessment/ plan:   SNF placement   Information/referral to community resources:    PATIENTS/FAMILYS RESPONSE TO PLAN OF CARE: Family was pleased with the care and services the hospital provided. Pt and family  agree that SNF would be best option for patient before returning home.

## 2012-11-12 NOTE — Op Note (Signed)
NAME:  [name], [name] NO.:  [id number]  MEDICAL RECORD NO.:  [id number]  LOCATION:  2306                         FACILITY:  [facility]  PHYSICIAN:  Kerin Perna, M.D.  DATE OF BIRTH:  04/22/1928  DATE OF PROCEDURE:  11/11/2012 DATE OF DISCHARGE:                              OPERATIVE REPORT   OPERATION: 1. 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 2. Right axillary cannulation for cardiopulmonary bypass. 3. Hypothermic circulatory arrest for proximal arch reconstruction.  PREOPERATIVE DIAGNOSIS:  Subacute type A dissection with intramural hematoma and recent sudden onset of severe pain.  POSTOPERATIVE DIAGNOSIS:  Subacute type A dissection with intramural hematoma and recent sudden onset of severe pain.  SURGEON:  Kerin Perna, M.D.  ASSISTANT:  Rowe Clack, P.A.-C., and Benay Spice, RNFA.  ANESTHESIA:  General by Judie Petit, M.D.  INDICATIONS:  The patient is an 79 year old, [place] female who presented to the office after an episode of severe chest pain and a recent CT scan showing intramural hematoma of the ascending arch and descending aorta.  The 2D echocardiogram showed fairly well-preserved LV function with very mild aortic insufficiency.  Cardiac catheterization was not performed due to her intramural hematoma.  She was felt to be a candidate for replacement of her ascending aorta, due to the subacute nature of the type A dissection, which resulted in an intramural hematoma.  I discussed the procedure of ascending aortic replacement with the patient and her daughter using general anesthesia, cardiopulmonary bypass, hypothermic circulatory arrest, and discussed the expected hospital recovery.  I reviewed with the patient the risks including risks of stroke, MI, bleeding, blood transfusion requirement,  infection, and death.  The patient understood these issues as discussed in detail with the patient and her daughter in attendance and agreed to proceed with surgery under what I felt was an informed consent.  OPERATIVE FINDINGS:  False lumen with hematoma in the right lateral aspect of the ascending aorta, extending into the arch.  OPERATIVE PROCEDURE:  The patient was brought to the operating room and placed supine on the operating table, where general anesthesia was induced.  Under invasive hemodynamic monitoring.  The transesophageal echo probe was placed by the anesthesiologist.  This showed mild aortic insufficiency.  A femoral A-line was placed in the left femoral artery for blood pressure monitoring.  The patient was prepped and draped as a sterile field.  A proper time- out was performed.  An incision was made in the right deltopectoral groove to expose the right axillary artery.  The axillary artery was a disease-free vessel without dissection.  Heparin was administered and the axillary artery was clamped proximally and distally.  An arteriotomy was made and an 8 mm Gore-Tex graft - cannula was then sewn to the axillary artery end-to-side running 5-0 Prolene.  The clamps were removed and there was good filling of the cannula which was flushed.  It was then connected to the pump inflow tubing.  A sternal incision was then made.  There were adhesions in the anterior mediastinum.  The ascending aorta was very enlarged and had a thick adventitial peel and connected to the pericardium, innominate vein, pulmonary artery, and the fat over the RV outflow tract.  All this was dissected out.  A pursestring was placed in the right atrium and full- dose heparin was administered.  When the ACT was documented as being therapeutic.  The patient was cannulated in the right atrium.  She had already been cannulated for arterial inflow in the right axillary artery.  She was placed on  cardiopulmonary bypass.  A left ventricular vent was placed via the right superior pulmonary vein.  A  pursestring was placed for retrograde coronary cardioplegia.  The patient was cooled down to 22 degrees.  A large crossclamp was then placed after the innominate artery had been dissected out and encircled with a vessel loop.  The crossclamp was placed just beneath the innominate artery.  Cardioplegia was delivered to the coronary sinus, and after 800 mL there was good cardioplegic arrest with septal temperature dropping less than 10 degrees.  Perfusion continued to cooled the patient down to 22 degrees.  The aorta was transected just below the clamp.  There was a false lumen filled with hematoma, non-clotted and clotted blood along the right lateral aspect.  The aorta was carefully dissected off the pulmonary artery, against which it was severely adhered.  Next, the aorta was transected above the sinotubular junction.  This was done carefully to avoid injury to the coronary arteries.  The dissection flap had carried down into the right coronary cusp, but only partially in the coronary ostia, it was not involved with the dissection.  The aortic valve was inspected.  She had 3 leaflets.  They appeared to have good apposition and appeared to be competent.  It was decided to replace the ascending aorta with a straight graft. The aorta was sized to a 30-mm Hemashield graft.  The graft was sewn end- to-end to the ascending aorta just at the sinotubular junction using a running 3-0 Prolene.  This was reinforced with 4-0 Prolene pledgeted sutures, placed on the inside of the aorta to provide strength and hemostasis along the back wall.  These interrupted 4-0 horizontally mattress pledgeted sutures also helped to resuspend the commissures in the valve.  Next, the anterior aspect of the anastomosis was completed with the continuation of the running 3-0 Prolene around anteriorly.  After  this was tied, interrupted 4-0 Prolene pledgeted sutures were placed around the outside of anastomosis for strength and for hemostasis.  A light layer of FloSeal medical adhesive was placed.  At this point, the patient had been cooled down to 18 degrees and hypothermic circulatory arrest was initiated.  After the pump was drained, a vascular clamp was placed at the origin of the innominate artery and flow through the right axillary artery continued approximately 500 mL/minute of cold blood.  This was providing antegrade cerebral perfusion.  The aorta was transected distally just below the innominate artery.  It was carefully removed from its adherence to the main pulmonary artery.  There was a dissection flap in the aorta at this point.  BioGlue with Teflon felt was placed into the false lumen, and compressed with a 30 mL Foley balloon.  Next, the graft was cut to the appropriate length and taper, and sewn end-to-end to the proximal arch with running 3-0 Prolene.  After the bottom portion half of the anastomosis was completed, interrupted 4-0 Prolene sutures  were placed around the anastomosis to provide strength and hemostasis.  The anastomosis was then carried down anteriorly and complete it.  At this point, the clamp was taken off the innominate artery and reperfusion to the body was slowly resumed.  Interrupted 4-0 Prolene sutures were placed around the anterior aspect of the distal anastomosis to provide strength and hemostasis.  The patient was rewarmed and reperfused back to normothermia.  There were some areas of the distal outflow anastomosis which were bleeding from needle holes and these were secured with interrupted pledgeted 4-0 Prolene sutures. Temporary pacing wires were applied.  When the patient reached 36.5 degrees.  The lungs re-expanded and the ventilator was resumed.  The patient was placed on renal dose dopamine and then weaned from cardiopulmonary bypass without  difficulty.  The transesophageal echo showed good LV function, with no more than mild aortic insufficiency. The venous cannula was removed.  The patient was given protamine without adverse reaction.  The patient remained hemodynamically stable. Protamine was completed and the axillary artery graft was clamped, transected, oversewn with running 4-0 Prolene.  The mediastinum was irrigated.  The anterior mediastinal fat was closed over the aorta.  An anterior mediastinal and posterior mediastinal chest tube were placed and brought out through separate incisions.  The sternum was closed with wire.  The pectoralis fascia was closed with running #1 Vicryl.  The axillary incision was closed in layers using running Vicryl.  The skin was closed with a subcuticular and sterile dressings were applied. Total pump time was 214 minutes.  Circulatory arrest time was 41 minutes.  The patient returned to the ICU in critical, but stable condition.     Kerin Perna, M.D.     PV/MEDQ  D:  11/11/2012  T:  11/12/2012  Job:  [id number]  cc:   Jake Bathe, MD

## 2012-11-13 ENCOUNTER — Inpatient Hospital Stay (HOSPITAL_COMMUNITY): Payer: Medicare PPO

## 2012-11-13 LAB — BASIC METABOLIC PANEL
BUN: 11 mg/dL (ref 6–23)
CO2: 28 mEq/L (ref 19–32)
Calcium: 8.4 mg/dL (ref 8.4–10.5)
Chloride: 102 mEq/L (ref 96–112)
Creatinine, Ser: 0.75 mg/dL (ref 0.50–1.10)
GFR calc Af Amer: 89 mL/min — ABNORMAL LOW (ref >90)
GFR calc non Af Amer: 77 mL/min — ABNORMAL LOW (ref >90)
Glucose, Bld: 118 mg/dL — ABNORMAL HIGH (ref 70–99)
Potassium: 4.1 mEq/L (ref 3.5–5.1)
Sodium: 138 mEq/L (ref 135–145)

## 2012-11-13 LAB — GLUCOSE, CAPILLARY
Glucose-Capillary: 102 mg/dL — ABNORMAL HIGH (ref 70–99)
Glucose-Capillary: 104 mg/dL — ABNORMAL HIGH (ref 70–99)
Glucose-Capillary: 124 mg/dL — ABNORMAL HIGH (ref 70–99)
Glucose-Capillary: 114 mg/dL — ABNORMAL HIGH (ref 70–99)
Glucose-Capillary: 153 mg/dL — ABNORMAL HIGH (ref 70–99)
Glucose-Capillary: 126 mg/dL — ABNORMAL HIGH (ref 70–99)

## 2012-11-13 LAB — TYPE AND SCREEN
ABO/RH(D): A POS
Antibody Screen: NEGATIVE
Unit division: 0
Unit division: 0
Unit division: 0
Unit division: 0
Unit division: 0
Unit division: 0

## 2012-11-13 LAB — POCT I-STAT 3, ART BLOOD GAS (G3+)
Acid-Base Excess: 2 mmol/L (ref 0.0–2.0)
Bicarbonate: 26.4 mEq/L — ABNORMAL HIGH (ref 20.0–24.0)
O2 Saturation: 94 %
Patient temperature: 98.6
TCO2: 28 mmol/L (ref 0–100)
pCO2 arterial: 39.4 mmHg (ref 35.0–45.0)
pH, Arterial: 7.434 (ref 7.350–7.450)
pO2, Arterial: 70 mmHg — ABNORMAL LOW (ref 80.0–100.0)

## 2012-11-13 LAB — CBC
HCT: 32.7 % — ABNORMAL LOW (ref 36.0–46.0)
Hemoglobin: 11.3 g/dL — ABNORMAL LOW (ref 12.0–15.0)
MCH: 29.7 pg (ref 26.0–34.0)
MCHC: 34.6 g/dL (ref 30.0–36.0)
MCV: 86.1 fL (ref 78.0–100.0)
Platelets: 292 10*3/uL (ref 150–400)
RBC: 3.8 MIL/uL — ABNORMAL LOW (ref 3.87–5.11)
RDW: 15.1 % (ref 11.5–15.5)
WBC: 18 10*3/uL — ABNORMAL HIGH (ref 4.0–10.5)

## 2012-11-13 MED ORDER — POTASSIUM CHLORIDE 10 MEQ/50ML IV SOLN
10.0000 meq | INTRAVENOUS | Status: AC
  Administered 2012-11-13 (×2): 10 meq via INTRAVENOUS
  Filled 2012-11-13: qty 100

## 2012-11-13 MED ORDER — FUROSEMIDE 10 MG/ML IJ SOLN
40.0000 mg | Freq: Once | INTRAMUSCULAR | Status: AC
  Administered 2012-11-13: 40 mg via INTRAVENOUS
  Filled 2012-11-13: qty 4

## 2012-11-13 MED ORDER — CLONAZEPAM 0.5 MG PO TABS
0.5000 mg | ORAL_TABLET | Freq: Every day | ORAL | Status: DC
  Administered 2012-11-13 – 2012-11-18 (×6): 0.5 mg via ORAL
  Filled 2012-11-13 (×6): qty 1

## 2012-11-13 MED ORDER — ALUM & MAG HYDROXIDE-SIMETH 200-200-20 MG/5ML PO SUSP: 15.0000 mL | ORAL | Status: DC | PRN

## 2012-11-13 MED ORDER — FUROSEMIDE 10 MG/ML IJ SOLN
40.0000 mg | Freq: Two times a day (BID) | INTRAMUSCULAR | Status: DC
  Administered 2012-11-13 – 2012-11-14 (×2): 40 mg via INTRAVENOUS
  Filled 2012-11-13 (×3): qty 4

## 2012-11-13 NOTE — Progress Notes (Addendum)
[facility] DAILY PROGRESS NOTE                   [address]            [name] [address]          [phone]      2 Days Post-Op Procedure(s) (LRB): BENTALL PROCEDURE (N/A) INTRAOPERATIVE TRANSESOPHAGEAL ECHOCARDIOGRAM (N/A)  Total Length of Stay:  LOS: 5 days   Subjective: OOB in chair.  Comfortable, no complaints.  Desats easily, currently on 6L via FM, did not tolerate BiPap last night.   Objective: Vital signs in last 24 hours: Temp:  [97.5 F (36.4 C)-98.6 F (37 C)] 98.2 F (36.8 C) (04/02 0357) Pulse Rate:  [87-91] 89 (04/02 0700) Cardiac Rhythm:  [-] Atrial paced (04/01 2000) Resp:  [2-29] 18 (04/02 0700) BP: (93-128)/(55-77) 125/77 mmHg (04/02 0700) SpO2:  [88 %-97 %] 96 % (04/02 0700) Arterial Line BP: (108-119)/(55-62) 108/55 mmHg (04/01 0900) FiO2 (%):  [80 %] 80 % (04/01 2200) Weight:  [155 lb 10.3 oz (70.6 kg)] 155 lb 10.3 oz (70.6 kg) (04/02 0400)  Filed Weights   11/11/12 1600 11/12/12 0500 11/13/12 0400  Weight: 145 lb 1 oz (65.8 kg) 158 lb 11.7 oz (72 kg) 155 lb 10.3 oz (70.6 kg)  PRE-OPERATIVE WEIGHT: 65kg   Weight change: 10 lb 9.3 oz (4.8 kg)   Hemodynamic parameters for last 24 hours: PAP: (26-41)/(11-25) 40/23 mmHg  Intake/Output from previous day: 04/01 0701 - 04/02 0700 In: 1796 [P.O.:940; I.V.:506; IV Piggyback:350] Out: 2150 [Urine:2080; Chest Tube:70]  CBGs 113-118-135-102-104-118     Current Meds: Scheduled Meds: . acetaminophen  1,000 mg Oral Q6H   Or  . acetaminophen (TYLENOL) oral liquid 160 mg/5 mL  975 mg Per Tube Q6H  . amiodarone  100 mg Oral Daily  . aspirin EC  81 mg Oral Daily  . bisacodyl  10 mg Oral Daily   Or  . bisacodyl  10 mg Rectal Daily  . docusate sodium  200 mg Oral Daily  . furosemide  20 mg Intravenous BID  . insulin aspart  0-24 Units Subcutaneous Q4H  . metoprolol tartrate  12.5 mg Oral BID   Or  . metoprolol tartrate  12.5 mg Per Tube BID  . pantoprazole  40 mg Oral Daily  .  sodium chloride  3 mL Intravenous Q12H   Continuous Infusions: . sodium chloride 20 mL/hr at 11/13/12 0700  . DOPamine Stopped (11/12/12 0400)   PRN Meds:.influenza  inactive virus vaccine, metoprolol, morphine injection, ondansetron (ZOFRAN) IV, pneumococcal 23 valent vaccine, sodium chloride, traMADol  Physical Exam: General appearance: alert, cooperative and no distress Heart: Pacer turned down, SB/junctional in 60s with freq PACs under pacer. Lungs: Decreased BS in bases bilaterally Extremities: Trace LE edema Wound: Dressed and dry  Lab Results: CBC: Recent Labs  11/12/12 1700 11/12/12 1720 11/13/12 0355  WBC 16.9*  --  18.0*  HGB 11.4* 11.6* 11.3*  HCT 33.4* 34.0* 32.7*  PLT 277  --  292   BMET:  Recent Labs  11/12/12 0400  11/12/12 1720 11/13/12 0355  NA 142  --  142 138  K 3.6  --  3.9 4.1  CL 108  --  104 102  CO2 23  --   --  28  GLUCOSE 107*  --  118* 118*  BUN 9  --  8 11  CREATININE 0.64  < > 0.80 0.75  CALCIUM 7.6*  --   --  8.4  < > = values in this interval not displayed.  PT/INR:  Recent Labs  11/11/12 1600  LABPROT 18.9*  INR 1.64*   Radiology: Dg Chest Portable 1 View In Am  11/12/2012  *RADIOLOGY REPORT*  Clinical Data: Hypertension  PORTABLE CHEST - 1 VIEW  Comparison: November 11, 2012.  Findings: No significant change is seen involving position of endotracheal tube, left-sided chest tube or right internal jugular Swan-Ganz catheter.  Sternotomy wires are noted.  No pneumothorax is noted.  Left basilar opacity is unchanged compared to prior exam.  IMPRESSION: No pneumothorax seen.  Left chest tube remains in place.  No change in mild left basilar opacity concerning for atelectasis or effusion.   Original Report Authenticated By: Lupita Raider.,  M.D.    Dg Chest Portable 1 View  11/11/2012  *RADIOLOGY REPORT*  Clinical Data: Status post thoracic surgery  PORTABLE CHEST - 1 VIEW  Comparison: November 08, 2012.  Findings: Sternotomy wires are noted.   Endotracheal tube is in grossly good position with tip several centimeters above the carina.  Right internal jugular Swan-Ganz catheter is noted with tip in right main pulmonary artery. Nasogastric tube is seen coiled within the stomach.  Left-sided chest tube is noted without pneumothorax.  Left basilar opacity is noted concerning for effusion or atelectasis.  The right lung is clear.  IMPRESSION: Surgical changes as described above.  No pneumothorax is noted. Left basilar opacity is noted concerning for atelectasis or effusion.   Original Report Authenticated By: Lupita Raider.,  M.D.      Assessment/Plan: S/P Procedure(s) (LRB): BENTALL PROCEDURE (N/A) INTRAOPERATIVE TRANSESOPHAGEAL ECHOCARDIOGRAM (N/A) CV- On low dose Amio/Lopressor.  Wean pacer as able. BPs stable. Vol overload- diurese. Hopefully can d/c remaining CT. Continue pulm toilet/IS, wean O2 as tolerated. Mobilize-may need PT consult.     [name],[name] 11/13/2012 7:44 AM patient examined and medical record reviewed,agree with above note. VAN TRIGT III,Johnmichael Melhorn  Low O2 sats, needs aggressive diuresis PT, OT, CIR eval

## 2012-11-13 NOTE — Progress Notes (Addendum)
Pt refuses Bipap, MD and RN aware. Machine removed for room.

## 2012-11-13 NOTE — Progress Notes (Signed)
Patient ID: [name], female   DOB: [birth year], 79 y.o.   MRN: [id number]                   [address]            [name] [address]          [phone]     2 Days Post-Op Procedure(s) (LRB): BENTALL PROCEDURE (N/A) INTRAOPERATIVE TRANSESOPHAGEAL ECHOCARDIOGRAM (N/A)  Total Length of Stay:  LOS: 5 days  BP 110/66  Pulse 80  Temp(Src) 97.9 F (36.6 C) (Oral)  Resp 19  Ht 5\' 1"  (1.549 m)  Wt 155 lb 10.3 oz (70.6 kg)  BMI 29.42 kg/m2  SpO2 95%  .Intake/Output     04/01 0701 - 04/02 0700 04/02 0701 - 04/03 0700   P.O. 970 180   I.V. (mL/kg) 506 (7.2) 200 (2.8)   Blood     NG/GT     IV Piggyback 350 100   Total Intake(mL/kg) 1826 (25.9) 480 (6.8)   Urine (mL/kg/hr) 2180 (1.3) 1550 (2.1)   Emesis/NG output     Blood     Chest Tube 70 (0) 30 (0)   Total Output 2250 1580   Net -424 -1100          . sodium chloride 20 mL/hr at 11/13/12 0700     Lab Results  Component Value Date   WBC 18.0* 11/13/2012   HGB 11.3* 11/13/2012   HCT 32.7* 11/13/2012   PLT 292 11/13/2012   GLUCOSE 118* 11/13/2012   ALT 30 11/10/2012   AST 21 11/10/2012   NA 138 11/13/2012   K 4.1 11/13/2012   CL 102 11/13/2012   CREATININE 0.75 11/13/2012   BUN 11 11/13/2012   CO2 28 11/13/2012   TSH 0.706 11/08/2012   INR 1.64* 11/11/2012   Neuro intact Ambulating x2 today with help  Delight Ovens MD  Beeper [phone] Office [phone] 11/13/2012 5:18 PM

## 2012-11-13 NOTE — Progress Notes (Signed)
Reported off to oncoming shift RN. No acute distress noted. Safety maintained.  

## 2012-11-13 NOTE — Progress Notes (Signed)
Subjective:  No complaints.   Objective:  Vital Signs in the last 24 hours: Temp:  [97.5 F (36.4 C)-98.6 F (37 C)] 97.9 F (36.6 C) (04/02 0750) Pulse Rate:  [87-93] 93 (04/02 0800) Resp:  [2-29] 20 (04/02 0800) BP: (93-128)/(55-77) 115/75 mmHg (04/02 0800) SpO2:  [88 %-97 %] 92 % (04/02 0800) FiO2 (%):  [50 %-80 %] 50 % (04/02 0800) Weight:  [70.6 kg (155 lb 10.3 oz)] 70.6 kg (155 lb 10.3 oz) (04/02 0400)  Intake/Output from previous day: 04/01 0701 - 04/02 0700 In: 1796 [P.O.:940; I.V.:506; IV Piggyback:350] Out: 2250 [Urine:2180; Chest Tube:70]   Physical Exam: General: Well developed, well nourished, in no acute distress.FM o2 Head:  Normocephalic and atraumatic. Neurologic: Alert and oriented x 3.    Lab Results:  Recent Labs  11/12/12 1700 11/12/12 1720 11/13/12 0355  WBC 16.9*  --  18.0*  HGB 11.4* 11.6* 11.3*  PLT 277  --  292    Recent Labs  11/12/12 0400  11/12/12 1720 11/13/12 0355  NA 142  --  142 138  K 3.6  --  3.9 4.1  CL 108  --  104 102  CO2 23  --   --  28  GLUCOSE 107*  --  118* 118*  BUN 9  --  8 11  CREATININE 0.64  < > 0.80 0.75  < > = values in this interval not displayed.  Imaging: Dg Chest Port 1 View  11/13/2012  *RADIOLOGY REPORT*  Clinical Data: Open heart surgery  PORTABLE CHEST - 1 VIEW  Comparison: 11/12/2012  Findings: Endotracheal tube has been removed.  NG has been removed. Pericardial drain has been removed.  Swan-Ganz catheters been removed.  Right jugular sheath remains in the SVC.  Negative for heart failure.  Negative for pneumothorax.  Mild bibasilar atelectasis and small effusions are unchanged.  IMPRESSION: Mild bibasilar atelectasis and small pleural effusions.  Negative for edema.   Original Report Authenticated By: Janeece Riggers, M.D.    Dg Chest Portable 1 View In Am  11/12/2012  *RADIOLOGY REPORT*  Clinical Data: Hypertension  PORTABLE CHEST - 1 VIEW  Comparison: November 11, 2012.  Findings: No significant change is  seen involving position of endotracheal tube, left-sided chest tube or right internal jugular Swan-Ganz catheter.  Sternotomy wires are noted.  No pneumothorax is noted.  Left basilar opacity is unchanged compared to prior exam.  IMPRESSION: No pneumothorax seen.  Left chest tube remains in place.  No change in mild left basilar opacity concerning for atelectasis or effusion.   Original Report Authenticated By: Lupita Raider.,  M.D.    Dg Chest Portable 1 View  11/11/2012  *RADIOLOGY REPORT*  Clinical Data: Status post thoracic surgery  PORTABLE CHEST - 1 VIEW  Comparison: November 08, 2012.  Findings: Sternotomy wires are noted.  Endotracheal tube is in grossly good position with tip several centimeters above the carina.  Right internal jugular Swan-Ganz catheter is noted with tip in right main pulmonary artery. Nasogastric tube is seen coiled within the stomach.  Left-sided chest tube is noted without pneumothorax.  Left basilar opacity is noted concerning for effusion or atelectasis.  The right lung is clear.  IMPRESSION: Surgical changes as described above.  No pneumothorax is noted. Left basilar opacity is noted concerning for atelectasis or effusion.   Original Report Authenticated By: Lupita Raider.,  M.D.    Personally viewed.   Telemetry: AFIB occas V pacing Personally viewed.  Assessment/Plan:  Active Problems:   Intramural aortic hematoma   POST OP Ascending aorta dissection.  1) AFIB  - will further discuss timing of anticoagulation  - rate cont  2) pulm toilet.      [name] 11/13/2012, 9:00 AM

## 2012-11-13 NOTE — Evaluation (Addendum)
Physical Therapy Evaluation Patient Details Name: [name] MRN: [id number] DOB: 10/16/1927 Today's Date: 11/13/2012 Time: 3086-5784 PT Time Calculation (min): 25 min  PT Assessment / Plan / Recommendation Clinical Impression  Pt is a pleasent 79 y.o. female s/p aortic dissection sx. Pt demonstrates deficits in functional mobility secondary to pain, deconditioning, and poor cardiopulminary status. Patient overall ambulating at min guard levels but requires multiple cued rest breaks and VCs for breathing to increase O2 saturation. Patient on 15 Liters partial rebreather mask with bag and desaturates into the 80s approximately every 20 feet. Will continue to see acutely to improve activity tolerance and maximize function.  Unsure patients home environment or assist available at this time as there is no family in room. Will try to meet with family for interpretation next treatment session, if not available will attempt further communication through interpreter line.     PT Assessment  Patient needs continued PT services    Follow Up Recommendations  SNF; vs Home health PT;Supervision/Assistance - 24 hour    Does the patient have the potential to tolerate intense rehabilitation      Barriers to Discharge Other (comment) (Will be determined once communication with family)      Equipment Recommendations  Rolling walker with 5" wheels    Recommendations for Other Services OT consult   Frequency Min 3X/week    Precautions / Restrictions Precautions Precautions: Sternal Precaution Comments: attempted to educate patient via demonstration and instructions ON:[id number], will provide handout for family Restrictions Weight Bearing Restrictions:  (sternal)   Pertinent Vitals/Pain "sore";       Mobility  Bed Mobility Bed Mobility: Supine to Sit;Sitting - Scoot to Edge of Bed Supine to Sit: 4: Min assist Sitting - Scoot to [place] of Bed: 5: Supervision Details for Bed Mobility Assistance:  Visual and verbal cues for sternal precautions  Transfers Transfers: Sit to Stand;Stand to Sit Sit to Stand: 4: Min guard;From bed Stand to Sit: 4: Min guard;To chair/3-in-1 Details for Transfer Assistance: Visual and multi modal cues for sternal precautions, provided pillow to hold during transfers Ambulation/Gait Ambulation/Gait Assistance: 4: Min guard Ambulation Distance (Feet): 160 Feet Assistive device: Rolling walker (raised up 2 levels to prevent weight bearing) Ambulation/Gait Assistance Details: Steady but requires rest breaks every 20 ft secondary to SpO2 desaturation Gait Pattern: Decreased stride length;Narrow base of support Gait velocity: decreased General Gait Details: overall steady, limited secondary to poor O2 saturation Stairs: No        PT Diagnosis: Difficulty walking;Generalized weakness;Acute pain  PT Problem List: Decreased mobility;Decreased knowledge of use of DME;Decreased safety awareness;Decreased knowledge of precautions;Cardiopulmonary status limiting activity PT Treatment Interventions: DME instruction;Gait training;Stair training;Functional mobility training;Therapeutic activities;Therapeutic exercise;Patient/family education   PT Goals Acute Rehab PT Goals PT Goal Formulation: Patient unable to participate in goal setting Time For Goal Achievement: 11/27/12 Potential to Achieve Goals: Good Pt will go Supine/Side to Sit: with modified independence PT Goal: Supine/Side to Sit - Progress: Goal set today Pt will go Sit to Supine/Side: with modified independence PT Goal: Sit to Supine/Side - Progress: Goal set today Pt will go Sit to Stand: with modified independence PT Goal: Sit to Stand - Progress: Goal set today Pt will go Stand to Sit: with modified independence PT Goal: Stand to Sit - Progress: Goal set today Pt will Ambulate: >150 feet;with modified independence PT Goal: Ambulate - Progress: Goal set today Pt will Go Up / Down Stairs:  Flight;with min assist PT Goal: Up/Down Stairs - Progress: Goal  set today  Visit Information  Last PT Received On: 11/13/12 Assistance Needed: +1    Subjective Data  Subjective: I don't know what happened Patient Stated Goal: to walk   Prior Functioning  Home Living Lives With: Daughter Available Help at Discharge: Family Type of Home: Apartment Home Layout: Multi-level Prior Function Level of Independence: Independent Able to Take Stairs?: Yes Communication Communication: Prefers language other than English    Cognition  Cognition Overall Cognitive Status: Difficult to assess Difficult to assess due to: Non-English speaking    Extremity/Trunk Assessment Right Upper Extremity Assessment RUE ROM/Strength/Tone: [facility] for tasks assessed Left Upper Extremity Assessment LUE ROM/Strength/Tone: [facility] for tasks assessed Right Lower Extremity Assessment RLE ROM/Strength/Tone: [facility] for tasks assessed Left Lower Extremity Assessment LLE ROM/Strength/Tone: [facility] for tasks assessed   Balance    End of Session PT - End of Session Equipment Utilized During Treatment: Gait belt;Oxygen (Partial rebreather 15 L) Activity Tolerance: Patient limited by fatigue;Treatment limited secondary to medical complications (Comment) (poor O2 saturation) Patient left: in chair;with call bell/phone within reach;with restraints reapplied Nurse Communication: Mobility status  GP     Fabio Asa 11/13/2012, 3:08 PM [place] Crumb, PT DPT  [phone]

## 2012-11-14 ENCOUNTER — Inpatient Hospital Stay (HOSPITAL_COMMUNITY): Payer: Medicare PPO

## 2012-11-14 LAB — GLUCOSE, CAPILLARY
Glucose-Capillary: 96 mg/dL (ref 70–99)
Glucose-Capillary: 94 mg/dL (ref 70–99)
Glucose-Capillary: 153 mg/dL — ABNORMAL HIGH (ref 70–99)
Glucose-Capillary: 94 mg/dL (ref 70–99)

## 2012-11-14 LAB — COMPREHENSIVE METABOLIC PANEL
ALT: 18 U/L (ref 0–35)
AST: 18 U/L (ref 0–37)
Albumin: 2.5 g/dL — ABNORMAL LOW (ref 3.5–5.2)
Alkaline Phosphatase: 65 U/L (ref 39–117)
BUN: 14 mg/dL (ref 6–23)
CO2: 31 mEq/L (ref 19–32)
Calcium: 8.4 mg/dL (ref 8.4–10.5)
Chloride: 102 mEq/L (ref 96–112)
Creatinine, Ser: 0.79 mg/dL (ref 0.50–1.10)
GFR calc Af Amer: 87 mL/min — ABNORMAL LOW (ref >90)
GFR calc non Af Amer: 75 mL/min — ABNORMAL LOW (ref >90)
Glucose, Bld: 97 mg/dL (ref 70–99)
Potassium: 3.4 mEq/L — ABNORMAL LOW (ref 3.5–5.1)
Sodium: 141 mEq/L (ref 135–145)
Total Bilirubin: 0.6 mg/dL (ref 0.3–1.2)
Total Protein: 5.7 g/dL — ABNORMAL LOW (ref 6.0–8.3)

## 2012-11-14 LAB — CBC
HCT: 33.4 % — ABNORMAL LOW (ref 36.0–46.0)
Hemoglobin: 10.8 g/dL — ABNORMAL LOW (ref 12.0–15.0)
MCH: 28.5 pg (ref 26.0–34.0)
MCHC: 32.3 g/dL (ref 30.0–36.0)
MCV: 88.1 fL (ref 78.0–100.0)
Platelets: 306 10*3/uL (ref 150–400)
RBC: 3.79 MIL/uL — ABNORMAL LOW (ref 3.87–5.11)
RDW: 14.9 % (ref 11.5–15.5)
WBC: 14.9 10*3/uL — ABNORMAL HIGH (ref 4.0–10.5)

## 2012-11-14 MED ORDER — POTASSIUM CHLORIDE 10 MEQ/50ML IV SOLN
INTRAVENOUS | Status: AC
  Administered 2012-11-14: 10 meq
  Filled 2012-11-14: qty 50

## 2012-11-14 MED ORDER — FUROSEMIDE 40 MG PO TABS
40.0000 mg | ORAL_TABLET | Freq: Every day | ORAL | Status: DC
  Administered 2012-11-15 – 2012-11-19 (×5): 40 mg via ORAL
  Filled 2012-11-14 (×5): qty 1

## 2012-11-14 MED ORDER — INSULIN ASPART 100 UNIT/ML ~~LOC~~ SOLN
0.0000 [IU] | Freq: Three times a day (TID) | SUBCUTANEOUS | Status: DC
  Administered 2012-11-14 – 2012-11-19 (×3): 2 [IU] via SUBCUTANEOUS

## 2012-11-14 MED ORDER — POTASSIUM CHLORIDE CRYS ER 20 MEQ PO TBCR
20.0000 meq | EXTENDED_RELEASE_TABLET | Freq: Every day | ORAL | Status: DC
  Filled 2012-11-14: qty 1

## 2012-11-14 MED ORDER — SODIUM CHLORIDE 0.9 % IJ SOLN
3.0000 mL | Freq: Two times a day (BID) | INTRAMUSCULAR | Status: DC
  Administered 2012-11-14 – 2012-11-19 (×8): 3 mL via INTRAVENOUS

## 2012-11-14 MED ORDER — SODIUM CHLORIDE 0.9 % IJ SOLN: 3.0000 mL | INTRAMUSCULAR | Status: DC | PRN

## 2012-11-14 MED ORDER — BISACODYL 5 MG PO TBEC: 10.0000 mg | DELAYED_RELEASE_TABLET | Freq: Every day | ORAL | Status: DC | PRN

## 2012-11-14 MED ORDER — POTASSIUM CHLORIDE 10 MEQ/50ML IV SOLN
INTRAVENOUS | Status: AC
  Administered 2012-11-14: 10 meq
  Filled 2012-11-14: qty 100

## 2012-11-14 MED ORDER — FUROSEMIDE 10 MG/ML IJ SOLN
40.0000 mg | Freq: Once | INTRAMUSCULAR | Status: AC
  Administered 2012-11-14: 40 mg via INTRAVENOUS
  Filled 2012-11-14: qty 4

## 2012-11-14 MED ORDER — MAGNESIUM HYDROXIDE 400 MG/5ML PO SUSP: 30.0000 mL | Freq: Every day | ORAL | Status: DC | PRN

## 2012-11-14 MED ORDER — MOVING RIGHT ALONG BOOK
Freq: Once | Status: AC
  Administered 2012-11-14: 14:00:00
  Filled 2012-11-14: qty 1

## 2012-11-14 MED ORDER — SODIUM CHLORIDE 0.9 % IV SOLN: 250.0000 mL | INTRAVENOUS | Status: DC | PRN

## 2012-11-14 MED ORDER — POTASSIUM CHLORIDE 10 MEQ/50ML IV SOLN
10.0000 meq | Freq: Once | INTRAVENOUS | Status: AC
  Administered 2012-11-14: 10 meq via INTRAVENOUS

## 2012-11-14 MED ORDER — BISACODYL 10 MG RE SUPP: 10.0000 mg | Freq: Every day | RECTAL | Status: DC | PRN

## 2012-11-14 NOTE — Progress Notes (Signed)
Physical Therapy Treatment Patient Details Name: [name] MRN: [id number] DOB: 05/24/1928 Today's Date: 11/14/2012 Time: 5784-6962 PT Time Calculation (min): 14 min  PT Assessment / Plan / Recommendation Comments on Treatment Session  Pt was very exhuasted today. Agreeable for minimal activity. Patient performed transfers, and in room ambulation. Limited by increase HR upper 120s. Will continue to see and progress activity as tolerated.    Follow Up Recommendations  Home health PT;SNF;Supervision/Assistance - 24 hour     Does the patient have the potential to tolerate intense rehabilitation     Barriers to Discharge Other (comment) (Will be determined once communication with family)      Equipment Recommendations  Rolling walker with 5" wheels    Recommendations for Other Services OT consult  Frequency Min 3X/week   Plan Discharge plan remains appropriate    Precautions / Restrictions Precautions Precautions: Sternal Precaution Comments: attempted to educate patient via demonstration and instructions XB:[id number], will provide handout for family Restrictions Weight Bearing Restrictions:  (sternal)   Pertinent Vitals/Pain Pt reports no pain, HR increased to upper 120s with minimal activity.     Mobility  Transfers Transfers: Sit to Stand;Stand to Sit Sit to Stand: 4: Min guard;From chair/3-in-1 Stand to Sit: 4: Min guard;To chair/3-in-1 Details for Transfer Assistance: Visual and multi modal cues for sternal precautions, provided pillow to hold during transfers Ambulation/Gait Ambulation/Gait Assistance: 4: Min guard Ambulation Distance (Feet): 20 Feet Assistive device: None Ambulation/Gait Assistance Details: Steady without assistive device; on 6 liters [place], HR increased to 120s with ambulation Gait Pattern: Decreased stride length;Narrow base of support Gait velocity: decreased General Gait Details: limited due to fatigue and increased HR Stairs: No        PT  Diagnosis: Difficulty walking;Generalized weakness;Acute pain  PT Problem List: Decreased mobility;Decreased knowledge of use of DME;Decreased safety awareness;Decreased knowledge of precautions;Cardiopulmonary status limiting activity PT Treatment Interventions: DME instruction;Gait training;Stair training;Functional mobility training;Therapeutic activities;Therapeutic exercise;Patient/family education   PT Goals Acute Rehab PT Goals PT Goal Formulation: Patient unable to participate in goal setting Time For Goal Achievement: 11/27/12 Potential to Achieve Goals: Good Pt will go Supine/Side to Sit: with modified independence Pt will go Sit to Supine/Side: with modified independence Pt will go Sit to Stand: with modified independence PT Goal: Sit to Stand - Progress: Progressing toward goal Pt will go Stand to Sit: with modified independence PT Goal: Stand to Sit - Progress: Progressing toward goal Pt will Ambulate: >150 feet;with modified independence PT Goal: Ambulate - Progress: Progressing toward goal Pt will Go Up / Down Stairs: Flight;with min assist  Visit Information  Last PT Received On: 11/14/12 Assistance Needed: +1    Subjective Data  Subjective: I already walked two times today Patient Stated Goal: to rest   Cognition  Cognition Overall Cognitive Status: Difficult to assess Difficult to assess due to: Non-English speaking    Balance   Pt steady without assistive device today  End of Session PT - End of Session Equipment Utilized During Treatment: Gait belt;Oxygen (Partial rebreather 15 L) Activity Tolerance: Patient limited by fatigue;Treatment limited secondary to medical complications (Comment) (increased HR) Patient left: in chair;with call bell/phone within reach;with restraints reapplied Nurse Communication: Mobility status   GP     Fabio Asa 11/14/2012, 4:56 PM [place] Crumb, PT DPT  [phone]

## 2012-11-14 NOTE — Progress Notes (Addendum)
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  Required partial NRB overnight as she did not tolerate BiPap.    Objective: Vital signs in last 24 hours: Temp:  [97.7 F (36.5 C)-98.3 F (36.8 C)] 98.3 F (36.8 C) (04/03 0404) Pulse Rate:  [78-93] 79 (04/03 0700) Cardiac Rhythm:  [-] Atrial paced (04/03 0400) Resp:  [16-30] 24 (04/03 0700) BP: (92-122)/(60-77) 121/73 mmHg (04/03 0700) SpO2:  [91 %-97 %] 97 % (04/03 0700) FiO2 (%):  [50 %] 50 % (04/02 0800) Weight:  [151 lb 14.4 oz (68.9 kg)] 151 lb 14.4 oz (68.9 kg) (04/03 0500)  Filed Weights   11/12/12 0500 11/13/12 0400 11/14/12 0500  Weight: 158 lb 11.7 oz (72 kg) 155 lb 10.3 oz (70.6 kg) 151 lb 14.4 oz (68.9 kg)  PRE-OPERATIVE WEIGHT: 65kg   Weight change: -3 lb 12 oz (-1.7 kg)   Hemodynamic parameters for last 24 hours:    Intake/Output from previous day: 04/02 0701 - 04/03 0700 In: 790 [P.O.:180; I.V.:460; IV Piggyback:150] Out: 2430 [Urine:2400; Chest Tube:30]  CBGs 153-126-96-97    Current Meds: Scheduled Meds: . acetaminophen  1,000 mg Oral Q6H   Or  . acetaminophen (TYLENOL) oral liquid 160 mg/5 mL  975 mg Per Tube Q6H  . amiodarone  100 mg Oral Daily  . aspirin EC  81 mg Oral Daily  . bisacodyl  10 mg Oral Daily   Or  . bisacodyl  10 mg Rectal Daily  . clonazePAM  0.5 mg Oral QHS  . docusate sodium  200 mg Oral Daily  . furosemide  40 mg Intravenous BID  . insulin aspart  0-24 Units Subcutaneous Q4H  . metoprolol tartrate  12.5 mg Oral BID   Or  . metoprolol tartrate  12.5 mg Per Tube BID  . pantoprazole  40 mg Oral Daily    Continuous Infusions: . sodium chloride 20 mL/hr at 11/13/12 0700   PRN Meds:.alum & mag hydroxide-simeth, influenza  inactive virus vaccine, metoprolol, ondansetron (ZOFRAN) IV, pneumococcal 23 valent vaccine, sodium chloride, traMADol  Physical Exam: General appearance: alert, cooperative and no distress Heart: regular rate and rhythm , paced at 80 Lungs: Decreased BS in bases and few crackles on L Extremities: Mild LE edema Wound: Dressed and dry  Lab Results: CBC: Recent Labs  11/13/12 0355 11/14/12 0410  WBC 18.0* 14.9*  HGB 11.3* 10.8*  HCT 32.7* 33.4*  PLT 292 306   BMET:  Recent Labs  11/13/12 0355 11/14/12 0410  NA 138 141  K 4.1 3.4*  CL 102 102  CO2 28 31  GLUCOSE 118* 97  BUN 11 14  CREATININE 0.75 0.79  CALCIUM 8.4 8.4    PT/INR:  Recent Labs  11/11/12 1600  LABPROT 18.9*  INR 1.64*   Radiology: Dg Chest Port 1 View  11/14/2012  *RADIOLOGY REPORT*  Clinical Data: Postoperative open heart surgery patient.  PORTABLE CHEST - 1 VIEW  Comparison: 11/13/2012  Findings: Stable appearance  of postoperative changes in the mediastinum.  Right central venous catheter is unchanged in position.  Cardiac enlargement with normal pulmonary vascularity. Shallow inspiration with atelectasis or infiltration in both lung bases.  This appears more prominent than on previous study. Interval development of a linear shadow along the right mid lung longitudinally.  Lung markings extend outside of this area this is likely a superficial skin fold.  IMPRESSION: Cardiac enlargement.  Increased atelectasis or infiltration in the lung bases since yesterday.   Original Report Authenticated By: Burman Nieves, M.D.    Dg Chest Port 1 View  11/13/2012  *RADIOLOGY REPORT*  Clinical Data: Open heart surgery  PORTABLE CHEST - 1 VIEW  Comparison: 11/12/2012  Findings: Endotracheal tube has been removed.  NG has been removed. Pericardial drain has been removed.  Swan-Ganz catheters been  removed.  Right jugular sheath remains in the SVC.  Negative for heart failure.  Negative for pneumothorax.  Mild bibasilar atelectasis and small effusions are unchanged.  IMPRESSION: Mild bibasilar atelectasis and small pleural effusions.  Negative for edema.   Original Report Authenticated By: Janeece Riggers, M.D.      Assessment/Plan: S/P Procedure(s) (LRB): BENTALL PROCEDURE (N/A) INTRAOPERATIVE TRANSESOPHAGEAL ECHOCARDIOGRAM (N/A) CV- Continue low dose Amio/Lopressor. Pacer at 80, rate controlled AF under pacer.  SBPs low normal, but improving.  Off all gtts. Vol overload- diuresing well.   Wt trending down.  Continue IV Lasix. Pulm- Desats, especially with exertion. Continue aggressive pulm toilet/IS/FV, wean O2 as tolerated.  Hypokalemia- K+ being replaced per protocol. Deconditioning- PT/OT. Probably will need to remain in ICU another day until breathing stable.  Leave Foley for now to measure UOP.      [name],[name] 11/14/2012 7:41 AM  Patient ambulating 300 feet daily Fluid overload is improving Patient still needs extra oxygen but should be able to transfer to step down to maintain progression Hold beta blocker because of slow heart rate, continue amiodarone because of prior history of atrial fibrillation, hold Coumadin because of  recent aortic dissection  patient examined and medical record reviewed,agree with above note. VAN TRIGT III,Jaivian Battaglini 11/14/2012

## 2012-11-14 NOTE — Progress Notes (Signed)
CARDIAC REHAB PHASE I   PRE:  Rate/Rhythm: 90 POD    BP: sitting 106/70    SaO2: 91 6L  MODE:  Ambulation: 440 ft   POST:  Rate/Rhythm: 94 POD    BP: sitting 104/59     SaO2: 94 50 % venti mask  Used RW and assist x2, 50% venti mask. Tolerated well, moving well. Asked pt to rest several times to make sure saO2 was good, maintained >90. Some fatigue. To recliner after walk. Encouraged IS, sitting up and third walk. Will f/u as x1. [id number]   Elissa Lovett [place] CES, ACSM 11/14/2012 2:19 PM

## 2012-11-14 NOTE — Progress Notes (Signed)
Verbal telephone report given to Shanda Bumps, RN who will be resuming care on unit 2000. Patient to be transferred to bed 2018. All personal belongings to be sent with patient including patient's cell phone.  All meds and chart given to receiving nurse.

## 2012-11-14 NOTE — Progress Notes (Signed)
Clinical Social Work: CSW presented patient and patient's daughter with bed offers. Daughter will make decision when she gets off work [employer]. Leaning towards [facility].

## 2012-11-14 NOTE — Progress Notes (Signed)
Attempted to ambulate with patient around unit, patient stated she had already walked two times today with rehab and PT and did not want to ambulate now. Encouraged to ambulate once more before bed. Dion Saucier

## 2012-11-14 NOTE — Progress Notes (Signed)
POST OP [place]  FM O2 Lasix Amio 100.  Continuing pulm toilet.  Agree with holding Bb

## 2012-11-15 ENCOUNTER — Inpatient Hospital Stay (HOSPITAL_COMMUNITY): Payer: Medicare PPO

## 2012-11-15 LAB — CBC
HCT: 35.1 % — ABNORMAL LOW (ref 36.0–46.0)
Hemoglobin: 11.6 g/dL — ABNORMAL LOW (ref 12.0–15.0)
MCH: 29.1 pg (ref 26.0–34.0)
MCHC: 33 g/dL (ref 30.0–36.0)
MCV: 88 fL (ref 78.0–100.0)
Platelets: 396 10*3/uL (ref 150–400)
RBC: 3.99 MIL/uL (ref 3.87–5.11)
RDW: 14.7 % (ref 11.5–15.5)
WBC: 12.1 10*3/uL — ABNORMAL HIGH (ref 4.0–10.5)

## 2012-11-15 LAB — BASIC METABOLIC PANEL
BUN: 19 mg/dL (ref 6–23)
CO2: 30 mEq/L (ref 19–32)
Calcium: 8.5 mg/dL (ref 8.4–10.5)
Chloride: 100 mEq/L (ref 96–112)
Creatinine, Ser: 0.87 mg/dL (ref 0.50–1.10)
GFR calc Af Amer: 69 mL/min — ABNORMAL LOW (ref >90)
GFR calc non Af Amer: 60 mL/min — ABNORMAL LOW (ref >90)
Glucose, Bld: 124 mg/dL — ABNORMAL HIGH (ref 70–99)
Potassium: 3.4 mEq/L — ABNORMAL LOW (ref 3.5–5.1)
Sodium: 138 mEq/L (ref 135–145)

## 2012-11-15 LAB — GLUCOSE, CAPILLARY
Glucose-Capillary: 118 mg/dL — ABNORMAL HIGH (ref 70–99)
Glucose-Capillary: 119 mg/dL — ABNORMAL HIGH (ref 70–99)
Glucose-Capillary: 126 mg/dL — ABNORMAL HIGH (ref 70–99)
Glucose-Capillary: 108 mg/dL — ABNORMAL HIGH (ref 70–99)
Glucose-Capillary: 112 mg/dL — ABNORMAL HIGH (ref 70–99)

## 2012-11-15 MED ORDER — POTASSIUM CHLORIDE CRYS ER 20 MEQ PO TBCR
40.0000 meq | EXTENDED_RELEASE_TABLET | Freq: Every day | ORAL | Status: DC
  Administered 2012-11-15 – 2012-11-19 (×5): 40 meq via ORAL
  Filled 2012-11-15: qty 2
  Filled 2012-11-15: qty 1
  Filled 2012-11-15 (×3): qty 2

## 2012-11-15 MED ORDER — AMIODARONE LOAD VIA INFUSION
150.0000 mg | Freq: Once | INTRAVENOUS | Status: DC
  Filled 2012-11-15: qty 83.34

## 2012-11-15 MED ORDER — DEXTROSE 5 % IV SOLN
5.0000 mg/h | INTRAVENOUS | Status: DC
  Administered 2012-11-15: 5 mg/h via INTRAVENOUS
  Filled 2012-11-15: qty 100

## 2012-11-15 MED ORDER — AMIODARONE HCL IN DEXTROSE 360-4.14 MG/200ML-% IV SOLN
30.0000 mg/h | INTRAVENOUS | Status: DC
  Administered 2012-11-15 – 2012-11-16 (×4): 30 mg/h via INTRAVENOUS
  Filled 2012-11-15 (×8): qty 200

## 2012-11-15 MED ORDER — AMIODARONE HCL IN DEXTROSE 360-4.14 MG/200ML-% IV SOLN
60.0000 mg/h | INTRAVENOUS | Status: AC
  Administered 2012-11-15 (×2): 60 mg/h via INTRAVENOUS
  Filled 2012-11-15: qty 200

## 2012-11-15 NOTE — Progress Notes (Addendum)
[address]       [place] [address]             [phone]    4 Days Post-Op  Procedure(s) (LRB): BENTALL PROCEDURE (N/A) INTRAOPERATIVE TRANSESOPHAGEAL ECHOCARDIOGRAM (N/A) Subjective: Feels ok, no specific complaints  Objective  Telemetry afib with RVR, pacer oversensing so will turn off  Temp:  [97.1 F (36.2 C)-97.6 F (36.4 C)] 97.1 F (36.2 C) (04/04 0333) Pulse Rate:  [79-91] 90 (04/03 1937) Resp:  [19-26] 20 (04/04 0333) BP: (89-115)/(62-74) 89/72 mmHg (04/04 0333) SpO2:  [90 %-97 %] 90 % (04/04 0333) FiO2 (%):  [50 %] 50 % (04/03 1000) Weight:  [148 lb 2.4 oz (67.2 kg)] 148 lb 2.4 oz (67.2 kg) (04/04 0333)   Intake/Output Summary (Last 24 hours) at 11/15/12 0735 Last data filed at 11/14/12 2150  Gross per 24 hour  Intake    543 ml  Output   1040 ml  Net   -497 ml       General appearance: alert, cooperative and no distress Heart: irregularly irregular rhythm Lungs: clear to auscultation bilaterally Abdomen: soft, nontender, + BS Extremities: minor edema Wound: dressings CDI  Lab Results:  Recent Labs  11/12/12 1700  11/14/12 0410 11/15/12 0535  NA  --   < > 141 138  K  --   < > 3.4* 3.4*  CL  --   < > 102 100  CO2  --   < > 31 30  GLUCOSE  --   < > 97 124*  BUN  --   < > 14 19  CREATININE 0.76  < > 0.79 0.87  CALCIUM  --   < > 8.4 8.5  MG 2.3  --   --   --   < > = values in this interval not displayed.  Recent Labs  11/14/12 0410  AST 18  ALT 18  ALKPHOS 65  BILITOT 0.6  PROT 5.7*  ALBUMIN 2.5*   No results found for this basename: LIPASE, AMYLASE,  in the last 72 hours  Recent Labs  11/14/12 0410 11/15/12 0535  WBC 14.9* 12.1*  HGB 10.8* 11.6*  HCT 33.4* 35.1*  MCV 88.1 88.0  PLT 306 396   No results found for this basename: CKTOTAL, CKMB, TROPONINI,  in the last 72 hours No components found with this basename: POCBNP,  No results found for this basename: DDIMER,  in the last 72 hours No results  found for this basename: HGBA1C,  in the last 72 hours No results found for this basename: CHOL, HDL, LDLCALC, TRIG, CHOLHDL,  in the last 72 hours No results found for this basename: TSH, T4TOTAL, FREET3, T3FREE, THYROIDAB,  in the last 72 hours No results found for this basename: VITAMINB12, FOLATE, FERRITIN, TIBC, IRON, RETICCTPCT,  in the last 72 hours  Medications: Scheduled . acetaminophen  1,000 mg Oral Q6H  . amiodarone  100 mg Oral Daily  . aspirin EC  81 mg Oral Daily  . bisacodyl  10 mg Oral Daily   Or  . bisacodyl  10 mg Rectal Daily  . clonazePAM  0.5 mg Oral QHS  . docusate sodium  200 mg Oral Daily  . furosemide  40 mg Oral Daily  . insulin aspart  0-24 Units Subcutaneous TID AC & HS  . pantoprazole  40 mg Oral Daily  . potassium chloride  20 mEq Oral Daily  . sodium chloride  3 mL Intravenous Q12H  Radiology/Studies:  Dg Chest Port 1 View  11/14/2012  *RADIOLOGY REPORT*  Clinical Data: Postoperative open heart surgery patient.  PORTABLE CHEST - 1 VIEW  Comparison: 11/13/2012  Findings: Stable appearance of postoperative changes in the mediastinum.  Right central venous catheter is unchanged in position.  Cardiac enlargement with normal pulmonary vascularity. Shallow inspiration with atelectasis or infiltration in both lung bases.  This appears more prominent than on previous study. Interval development of a linear shadow along the right mid lung longitudinally.  Lung markings extend outside of this area this is likely a superficial skin fold.  IMPRESSION: Cardiac enlargement.  Increased atelectasis or infiltration in the lung bases since yesterday.   Original Report Authenticated By: Burman Nieves, M.D.     INR: Will add last result for INR, ABG once components are confirmed Will add last 4 CBG results once components are confirmed  Assessment/Plan:  1 stable, now on IV amio, paced off but attached. HR in 120's to 130's 2 volume overload improving, renal function  stable. Change to po lasix, replace K+ 3 push pulm toilet , rehab 4 cbg's well conntrolled 5 H/H improved  LOS: 7 days    GOLD,WAYNE E 4/4/20147:35 AM  This pm HR > 130-- start cardizem drip  No coumadin with recent dissection of aorta

## 2012-11-15 NOTE — Progress Notes (Signed)
Dr. Zenaida Niece trigt informed the nurse that he was going to start a Cardizem drip on the patient running @ 5mg /hr. He instructed the nurse to check the BP every 30 minutes, and to stop if under 100. The nurse performed as was instructed. Harmon Pier

## 2012-11-15 NOTE — Progress Notes (Signed)
Physical Therapy Treatment Patient Details Name: Deborahann Poteat MRN: [id number] DOB: Apr 25, 1928 Today's Date: 11/15/2012 Time: 4696-2952 PT Time Calculation (min): 14 min  PT Assessment / Plan / Recommendation Comments on Treatment Session  Agreed to ambulate with persuasion.  Pt ambulated in halls with HR to 155.  HR decr once back in room.  Will continue to progress as tolerated.      Follow Up Recommendations  Home health PT;SNF;Supervision/Assistance - 24 hour                 Equipment Recommendations  Rolling walker with 5" wheels        Frequency Min 3X/week   Plan Discharge plan remains appropriate;Frequency remains appropriate    Precautions / Restrictions Precautions Precautions: Sternal Precaution Comments: attempted to educate patient via demonstration and instructions [id number], will provide handout for family   Pertinent Vitals/Pain HR up to 155 with ambulation, No pain    Mobility  Bed Mobility Bed Mobility: Right Sidelying to Sit Right Sidelying to Sit: 4: Min assist;HOB flat Supine to Sit: 4: Min assist Sitting - Scoot to Edge of Bed: 5: Supervision Details for Bed Mobility Assistance: needs cues for hand placement for sternal precautions Transfers Transfers: Sit to Stand;Stand to Sit Sit to Stand: 4: Min guard;From bed;From toilet;Without upper extremity assist Stand to Sit: 4: Min guard;To toilet;To chair/3-in-1;Without upper extremity assist Details for Transfer Assistance: visual and verbal cues for sternal precautions. Ambulation/Gait Ambulation/Gait Assistance: 4: Min assist Ambulation Distance (Feet): 180 Feet Assistive device: None Ambulation/Gait Assistance Details: Unsteady at times questionably secondary to lines vs. pt easily distracted.  HR incr to 155 with ambulation.  Gait Pattern: Decreased stride length;Narrow base of support Gait velocity: decreased General Gait Details: limited due to fatigue and increased HR Stairs: No          PT Goals Acute Rehab PT Goals PT Goal: Supine/Side to Sit - Progress: Progressing toward goal PT Goal: Sit to Stand - Progress: Progressing toward goal PT Goal: Stand to Sit - Progress: Progressing toward goal PT Goal: Ambulate - Progress: Progressing toward goal  Visit Information  Last PT Received On: 11/15/12 Assistance Needed: +1 PT/OT Co-Evaluation/Treatment: Yes    Subjective Data  Subjective: "I don't want to walk again."   Cognition  Cognition Overall Cognitive Status: Difficult to assess Difficult to assess due to: Non-English speaking    Balance  Static Standing Balance Static Standing - Balance Support: No upper extremity supported;During functional activity Static Standing - Level of Assistance: 5: Stand by assistance;4: Min assist Static Standing - Comment/# of Minutes: close guard assist for static stance  End of Session PT - End of Session Equipment Utilized During Treatment: Gait belt;Oxygen Activity Tolerance: Patient limited by fatigue;Treatment limited secondary to medical complications (Comment) Patient left: in chair;with call bell/phone within reach;with restraints reapplied Nurse Communication: Mobility status       INGOLD,Bertina Guthridge 11/15/2012, 4:53 PM [facility] Acute Rehabilitation [phone] (pager)

## 2012-11-15 NOTE — Progress Notes (Signed)
Subjective:  No complaints, No SOB, no CP. AFIB now RVR. Pacer wires in place. Prior bradycardia noted.  Amio IV now.  Objective:  Vital Signs in the last 24 hours: Temp:  [97.1 F (36.2 C)-97.6 F (36.4 C)] 97.1 F (36.2 C) (04/04 0333) Pulse Rate:  [90-91] 90 (04/03 1937) Resp:  [19-24] 20 (04/04 0333) BP: (89-109)/(62-74) 89/72 mmHg (04/04 0333) SpO2:  [90 %-94 %] 90 % (04/04 0333) FiO2 (%):  [50 %] 50 % (04/03 1000) Weight:  [67.2 kg (148 lb 2.4 oz)] 67.2 kg (148 lb 2.4 oz) (04/04 0333)  Intake/Output from previous day: 04/03 0701 - 04/04 0700 In: 543 [P.O.:360; I.V.:83; IV Piggyback:100] Out: 1040 [Urine:1040]   Physical Exam: General: Well developed, well nourished, in no acute distress. In chair Head:  Normocephalic and atraumatic. Lungs: Clear to auscultation and percussion. Heart: Tachy irreg. No murmur, rubs or gallops.  Abdomen: soft, non-tender, positive bowel sounds. Extremities: No clubbing or cyanosis. No edema. Neurologic: Alert and oriented x 3.    Lab Results:  Recent Labs  11/14/12 0410 11/15/12 0535  WBC 14.9* 12.1*  HGB 10.8* 11.6*  PLT 306 396    Recent Labs  11/14/12 0410 11/15/12 0535  NA 141 138  K 3.4* 3.4*  CL 102 100  CO2 31 30  GLUCOSE 97 124*  BUN 14 19  CREATININE 0.79 0.87   Hepatic Function Panel  Recent Labs  11/14/12 0410  PROT 5.7*  ALBUMIN 2.5*  AST 18  ALT 18  ALKPHOS 65  BILITOT 0.6   Imaging: Dg Chest Port 1 View  11/14/2012  *RADIOLOGY REPORT*  Clinical Data: Postoperative open heart surgery patient.  PORTABLE CHEST - 1 VIEW  Comparison: 11/13/2012  Findings: Stable appearance of postoperative changes in the mediastinum.  Right central venous catheter is unchanged in position.  Cardiac enlargement with normal pulmonary vascularity. Shallow inspiration with atelectasis or infiltration in both lung bases.  This appears more prominent than on previous study. Interval development of a linear shadow along the  right mid lung longitudinally.  Lung markings extend outside of this area this is likely a superficial skin fold.  IMPRESSION: Cardiac enlargement.  Increased atelectasis or infiltration in the lung bases since yesterday.   Original Report Authenticated By: Burman Nieves, M.D.    Personally viewed.   Telemetry: AFIB 130's Personally viewed.    Assessment/Plan:  Active Problems:   Intramural aortic hematoma  Post op [facility] for type A dissection.  1) Afib - now RVR. Was on low dose amio 100mg  chronically. Now on IV. If after a few hours not controlled, consider addition of metoprolol. I understand that bradycardia was present a few days ago. May be manifesting tachy/brady. Has pacer wires in place if needed.   2) Off coumadin with recent intramural hematoma.  3) Hypoxia - improving. PO lasix.    [name] 11/15/2012, 8:18 AM

## 2012-11-15 NOTE — Progress Notes (Signed)
Pt ambulated 150 ft around circle with rolling walker. Pt ambulated on 6L O2 throughout ambulation with oxygen saturations ranging from 90%-92%. Back to bed after ambulation. Dion Saucier

## 2012-11-15 NOTE — Progress Notes (Signed)
CARDIAC REHAB PHASE I   PRE:  Rate/Rhythm: 118 afib    BP: sitting 80/50, 84/55 standing 107/69    SaO2: 97 6L  MODE:  Ambulation: 150 ft   POST:  Rate/Rhythm: 138 afib    BP: sitting 99/73     SaO2: 96 6L  Pt feeling ok, just weak and tired. BP low and HR increased with walking (RW, 6L and assist x1) but no major c/o. Just general fatigue. To bed after walk. Will f/u. [id number]   Elissa Lovett [place] CES, ACSM 11/15/2012 10:55 AM

## 2012-11-15 NOTE — Progress Notes (Signed)
Occupational Therapy Treatment Patient Details Name: [name] MRN: [id number] DOB: 1928-04-06 Today's Date: 11/15/2012 Time: 4098-1191 OT Time Calculation (min): 14 min  OT Assessment / Plan / Recommendation Comments on Treatment Session Still unsure of level of support pt will have at home.  If 24/7 assist not available, recommending SNF.    Follow Up Recommendations  Home health OT;SNF;Supervision/Assistance - 24 hour    Barriers to Discharge       Equipment Recommendations  None recommended by OT    Recommendations for Other Services    Frequency Min 2X/week   Plan Discharge plan remains appropriate    Precautions / Restrictions Precautions Precautions: Sternal Precaution Comments: attempted to educate patient via demonstration and instructions [id number], will provide handout for family   Pertinent Vitals/Pain O2 sats on RA with ambulation 83%, reapplies O2 2L nasal cannula while ambulating sats increased to 94%.  HR fluctuating in 120s-140s during ambulation in room, increased to 155 while ambulating in hall therefore returned pt to chair and HR recovered to 130s.    ADL  Grooming: Performed;Wash/dry hands;Min guard Where Assessed - Grooming: Unsupported standing Toilet Transfer: Performed;Min guard Statistician Method: Sit to Barista: Regular height toilet;Grab bars Toileting - Clothing Manipulation and Hygiene: Performed;Min guard Where Assessed - Engineer, mining and Hygiene: Sit to stand from 3-in-1 or toilet Equipment Used: Gait belt Transfers/Ambulation Related to ADLs: min guard ambulating in hall.   ADL Comments: Visual and verbal cues to maintain sternal precautions.  HR increases with functional mobility.    OT Diagnosis:    OT Problem List:   OT Treatment Interventions:     OT Goals ADL Goals Pt Will Perform Grooming: with supervision;Standing at sink ADL Goal: Grooming - Progress: Progressing toward  goals Pt Will Transfer to Toilet: with supervision;Ambulation;Comfort height toilet ADL Goal: Toilet Transfer - Progress: Progressing toward goals Pt Will Perform Toileting - Clothing Manipulation: with supervision;Standing ADL Goal: Toileting - Clothing Manipulation - Progress: Progressing toward goals Pt Will Perform Toileting - Hygiene: with supervision;Sit to stand from 3-in-1/toilet ADL Goal: Toileting - Hygiene - Progress: Progressing toward goals Miscellaneous OT Goals Miscellaneous OT Goal #1: Pt will maintain sternal precautions during all ADLs and functional mobility with 1 visual cue. OT Goal: Miscellaneous Goal #1 - Progress: Progressing toward goals  Visit Information  Last OT Received On: 11/15/12 Assistance Needed: +1 PT/OT Co-Evaluation/Treatment: Yes    Subjective Data      Prior Functioning       Cognition  Cognition Overall Cognitive Status: Difficult to assess Difficult to assess due to: Non-English speaking    Mobility  Transfers Transfers: Sit to Stand;Stand to Sit Sit to Stand: 4: Min guard;From bed;From toilet;Without upper extremity assist Stand to Sit: 4: Min guard;To toilet;To chair/3-in-1;Without upper extremity assist Details for Transfer Assistance: visual and verbal cues for sternal precautions.    Exercises      Balance     End of Session OT - End of Session Equipment Utilized During Treatment: Gait belt Activity Tolerance: Patient tolerated treatment well Patient left: in chair;with call bell/phone within reach Nurse Communication: Mobility status  GO    11/15/2012 Cipriano Mile OTR/L Pager [phone] Office [phone]   Cipriano Mile 11/15/2012, 4:35 PM

## 2012-11-15 NOTE — Progress Notes (Signed)
Upon assessment of the BP, the nurse observes the patient's BP to be 87/68. The nurse stops the Cardizem drip and calls Dr. Donata Clay to inform him of the BP. Dr Donata Clay instructs the nurse to check the BP again @ 12 am, and if the SBP is above 100 run the Cardizem drip @ 2 mg/hr. The nurse will perform as was instructed. [name]

## 2012-11-15 NOTE — Progress Notes (Signed)
Patient got up and ventured to the bathroom at around 0255, however, after returning to the bed from the bathroom  the patient's HR remained around 135 to 148 range. After monitoring the HR for about 40 mins the nurse calls Dr. Cornelius Moras. Dr. Cornelius Moras instructed the nurse to administer a 150mg  bolus of Amio, then run the Amio drip at 30 mg/hr. The nurse performs as was instructed. After 10 mins the patient's HR decreased from 145 to 118 to 120. Harmon Pier

## 2012-11-16 LAB — GLUCOSE, CAPILLARY
Glucose-Capillary: 111 mg/dL — ABNORMAL HIGH (ref 70–99)
Glucose-Capillary: 137 mg/dL — ABNORMAL HIGH (ref 70–99)

## 2012-11-16 MED ORDER — AMIODARONE HCL 200 MG PO TABS
400.0000 mg | ORAL_TABLET | Freq: Two times a day (BID) | ORAL | Status: DC
  Administered 2012-11-16 – 2012-11-19 (×6): 400 mg via ORAL
  Filled 2012-11-16 (×8): qty 2

## 2012-11-16 MED ORDER — DIGOXIN 0.25 MG/ML IJ SOLN
0.2500 mg | Freq: Two times a day (BID) | INTRAMUSCULAR | Status: DC
  Filled 2012-11-16 (×4): qty 1

## 2012-11-16 MED ORDER — DIGOXIN 125 MCG PO TABS
0.1250 mg | ORAL_TABLET | Freq: Every day | ORAL | Status: DC
  Administered 2012-11-16 – 2012-11-19 (×4): 0.125 mg via ORAL
  Filled 2012-11-16 (×5): qty 1

## 2012-11-16 MED ORDER — POTASSIUM CHLORIDE CRYS ER 20 MEQ PO TBCR
20.0000 meq | EXTENDED_RELEASE_TABLET | Freq: Once | ORAL | Status: AC
  Administered 2012-11-16: 20 meq via ORAL
  Filled 2012-11-16: qty 1

## 2012-11-16 MED ORDER — SODIUM CHLORIDE 0.9 % IV SOLN
Freq: Once | INTRAVENOUS | Status: AC
  Administered 2012-11-16: 02:00:00 via INTRAVENOUS

## 2012-11-16 MED ORDER — DIGOXIN 0.25 MG/ML IJ SOLN
0.5000 mg | Freq: Once | INTRAMUSCULAR | Status: AC
  Administered 2012-11-16: 0.5 mg via INTRAVENOUS
  Filled 2012-11-16 (×2): qty 2

## 2012-11-16 MED ORDER — METOPROLOL TARTRATE 12.5 MG HALF TABLET
12.5000 mg | ORAL_TABLET | Freq: Two times a day (BID) | ORAL | Status: DC
  Administered 2012-11-16 – 2012-11-17 (×3): 12.5 mg via ORAL
  Filled 2012-11-16 (×5): qty 1

## 2012-11-16 NOTE — Progress Notes (Signed)
Pt refused to walk at this time. Pt states she will walk later this evening. Will try again later. Will continue to monitor.

## 2012-11-16 NOTE — Progress Notes (Signed)
Pt ambulated 150 ft with nurse on O2 @ 3L/[place].Marland Kitchen Steady gait and no signs of Dyspnea. Pt stated she was getting tiered but tolerated walk well.  Sharlyne Cai, RN

## 2012-11-16 NOTE — Progress Notes (Signed)
CARDIAC REHAB PHASE I   PRE:  Rate/Rhythm: 112 afib  BP:  Supine:   Sitting: 97/60  Standing:    SaO2: 93% 3L  MODE:  Ambulation: 300 ft   POST:  Rate/Rhythem: 125 afib  BP:  Supine:   Sitting: 119/73  Standing:    SaO2: 90% 3L  1430-1450  Pt ambulated in hallway x2assist with rolling walker.  Slow steady gait, 2 standing rest breaks.  Pt asymptomatic, tolerated well.  Back to bed, call light in reach.   [name], [place]

## 2012-11-16 NOTE — Progress Notes (Signed)
The nurse rechecks the BP: 86/57. The nurse pages Dr. Donata Clay to inform him of the vitals. Harmon Pier

## 2012-11-16 NOTE — Progress Notes (Signed)
Subjective:  Mild chest soreness.  Was on IV Cardizem overnight, but stopped due to low BP. Not SOB.   Objective:  Vital Signs in the last 24 hours: Temp:  [97.5 F (36.4 C)-97.7 F (36.5 C)] 97.5 F (36.4 C) (04/05 0337) Pulse Rate:  [109-155] 132 (04/05 0525) Resp:  [18-20] 18 (04/05 0337) BP: (86-112)/(57-84) 100/83 mmHg (04/05 0525) SpO2:  [90 %-97 %] 93 % (04/05 0337) Weight:  [70.8 kg (156 lb 1.4 oz)] 70.8 kg (156 lb 1.4 oz) (04/05 0337)  Intake/Output from previous day: 04/04 0701 - 04/05 0700 In: 523 [P.O.:240; I.V.:283] Out: -    Physical Exam: General: Well developed, well nourished, in no acute distress. Lungs: Clear  Heart: Tachy irreg. No murmur, rubs or gallops.  Extremities: No clubbing or cyanosis. No edema.  Lab Results:  Recent Labs  11/14/12 0410 11/15/12 0535  WBC 14.9* 12.1*  HGB 10.8* 11.6*  PLT 306 396    Recent Labs  11/14/12 0410 11/15/12 0535  NA 141 138  K 3.4* 3.4*  CL 102 100  CO2 31 30  GLUCOSE 97 124*  BUN 14 19  CREATININE 0.79 0.87   Hepatic Function Panel  Recent Labs  11/14/12 0410  PROT 5.7*  ALBUMIN 2.5*  AST 18  ALT 18  ALKPHOS 65  BILITOT 0.6    Telemetry: atrial fib still rapid Personally viewed.    Assessment/Plan:  Post op [facility] for type A dissection.  1) Afib - now RVR. Was on low dose amio 100mg  chronically. Now on IV.  Was on cardiazem overnight, bur stopped due to BP. 2) Off coumadin with recent intramural hematoma.  3) Hypoxia - improving. PO lasix.    I would add dig to help with rate control at present in view of BP  Terryann Verbeek JR,W SPENCER 11/16/2012, 9:08 AM

## 2012-11-16 NOTE — Progress Notes (Signed)
Nurse checks patient's BP @ 1230 am 88/60. The nurse stops the Cardizem Drip which was set @ 2 mg/hr. The nurse will recheck the BP @ 0100 am. If the SBP is above 100, the nurse will restart the Cardizem at 2 mg/hr. [name]

## 2012-11-16 NOTE — Progress Notes (Addendum)
[address]       [place] [address]             [phone]    5 Days Post-Op  Procedure(s) (LRB): BENTALL PROCEDURE (N/A) INTRAOPERATIVE TRANSESOPHAGEAL ECHOCARDIOGRAM (N/A) Subjective:  feels ok, no new complaints  Objective  Telemetry afib with rate in 120's   Temp:  [97.5 F (36.4 C)-97.7 F (36.5 C)] 97.5 F (36.4 C) (04/05 0337) Pulse Rate:  [109-155] 132 (04/05 0525) Resp:  [18-20] 18 (04/05 0337) BP: (86-112)/(57-84) 100/83 mmHg (04/05 0525) SpO2:  [90 %-97 %] 93 % (04/05 0337) Weight:  [156 lb 1.4 oz (70.8 kg)] 156 lb 1.4 oz (70.8 kg) (04/05 0337)   Intake/Output Summary (Last 24 hours) at 11/16/12 0959 Last data filed at 11/15/12 1500  Gross per 24 hour  Intake 402.98 ml  Output      0 ml  Net 402.98 ml       General appearance: alert, cooperative and no distress Heart: irregularly irregular rhythm Lungs: clear to auscultation bilaterally Abdomen: benign Extremities: no edema Wound: incisions healing well  Lab Results:  Recent Labs  11/14/12 0410 11/15/12 0535  NA 141 138  K 3.4* 3.4*  CL 102 100  CO2 31 30  GLUCOSE 97 124*  BUN 14 19  CREATININE 0.79 0.87  CALCIUM 8.4 8.5    Recent Labs  11/14/12 0410  AST 18  ALT 18  ALKPHOS 65  BILITOT 0.6  PROT 5.7*  ALBUMIN 2.5*   No results found for this basename: LIPASE, AMYLASE,  in the last 72 hours  Recent Labs  11/14/12 0410 11/15/12 0535  WBC 14.9* 12.1*  HGB 10.8* 11.6*  HCT 33.4* 35.1*  MCV 88.1 88.0  PLT 306 396   No results found for this basename: CKTOTAL, CKMB, TROPONINI,  in the last 72 hours No components found with this basename: POCBNP,  No results found for this basename: DDIMER,  in the last 72 hours No results found for this basename: HGBA1C,  in the last 72 hours No results found for this basename: CHOL, HDL, LDLCALC, TRIG, CHOLHDL,  in the last 72 hours No results found for this basename: TSH, T4TOTAL, FREET3, T3FREE, THYROIDAB,  in the  last 72 hours No results found for this basename: VITAMINB12, FOLATE, FERRITIN, TIBC, IRON, RETICCTPCT,  in the last 72 hours  Medications: Scheduled . acetaminophen  1,000 mg Oral Q6H  . amiodarone  100 mg Oral Daily  . aspirin EC  81 mg Oral Daily  . bisacodyl  10 mg Oral Daily   Or  . bisacodyl  10 mg Rectal Daily  . clonazePAM  0.5 mg Oral QHS  . digoxin  0.25 mg Intravenous BID  . digoxin  0.125 mg Oral Daily  . docusate sodium  200 mg Oral Daily  . furosemide  40 mg Oral Daily  . insulin aspart  0-24 Units Subcutaneous TID AC & HS  . pantoprazole  40 mg Oral Daily  . potassium chloride  40 mEq Oral Daily  . sodium chloride  3 mL Intravenous Q12H     Radiology/Studies:  Dg Chest Port 1 View  11/15/2012  *RADIOLOGY REPORT*  Clinical Data: Status post coronary bypass grafting  PORTABLE CHEST - 1 VIEW  Comparison: 11/14/2012  Findings: The cardiac shadow is enlarged with enlargement of the left atrium.  The Cordis sheath has been removed.  The lungs are well aerated and demonstrate some very minimal atelectatic changes. No focal confluent  infiltrate is seen.  IMPRESSION: Stable cardiomegaly.  No focal infiltrate is noted.   Original Report Authenticated By: Alcide Clever, M.D.     INR: Will add last result for INR, ABG once components are confirmed Will add last 4 CBG results once components are confirmed  Assessment/Plan: S/P Procedure(s) (LRB): BENTALL PROCEDURE (N/A) INTRAOPERATIVE TRANSESOPHAGEAL ECHOCARDIOGRAM (N/A)  1 BP did not tolerate cardizem. Cardiology suggests addition of digoxin and has ordered. Poss transition to po amio soon 2 otherwise quite stable and improving    LOS: 8 days    GOLD,WAYNE E 4/5/20149:59 AM  Will start metoprolol po and change amio to po Digoxin .125 daily

## 2012-11-17 ENCOUNTER — Inpatient Hospital Stay (HOSPITAL_COMMUNITY): Payer: Medicare PPO

## 2012-11-17 LAB — CBC
HCT: 34 % — ABNORMAL LOW (ref 36.0–46.0)
Hemoglobin: 11.5 g/dL — ABNORMAL LOW (ref 12.0–15.0)
MCH: 29.2 pg (ref 26.0–34.0)
MCHC: 33.8 g/dL (ref 30.0–36.0)
MCV: 86.3 fL (ref 78.0–100.0)
Platelets: 508 10*3/uL — ABNORMAL HIGH (ref 150–400)
RBC: 3.94 MIL/uL (ref 3.87–5.11)
RDW: 14.7 % (ref 11.5–15.5)
WBC: 9.1 10*3/uL (ref 4.0–10.5)

## 2012-11-17 LAB — BASIC METABOLIC PANEL
BUN: 10 mg/dL (ref 6–23)
CO2: 25 mEq/L (ref 19–32)
Calcium: 8.5 mg/dL (ref 8.4–10.5)
Chloride: 103 mEq/L (ref 96–112)
Creatinine, Ser: 0.68 mg/dL (ref 0.50–1.10)
GFR calc Af Amer: >90 mL/min (ref >90)
GFR calc non Af Amer: 79 mL/min — ABNORMAL LOW (ref >90)
Glucose, Bld: 106 mg/dL — ABNORMAL HIGH (ref 70–99)
Potassium: 3.7 mEq/L (ref 3.5–5.1)
Sodium: 138 mEq/L (ref 135–145)

## 2012-11-17 LAB — GLUCOSE, CAPILLARY
Glucose-Capillary: 136 mg/dL — ABNORMAL HIGH (ref 70–99)
Glucose-Capillary: 102 mg/dL — ABNORMAL HIGH (ref 70–99)
Glucose-Capillary: 108 mg/dL — ABNORMAL HIGH (ref 70–99)
Glucose-Capillary: 105 mg/dL — ABNORMAL HIGH (ref 70–99)
Glucose-Capillary: 132 mg/dL — ABNORMAL HIGH (ref 70–99)
Glucose-Capillary: 110 mg/dL — ABNORMAL HIGH (ref 70–99)

## 2012-11-17 MED ORDER — METOPROLOL TARTRATE 25 MG PO TABS
25.0000 mg | ORAL_TABLET | Freq: Two times a day (BID) | ORAL | Status: DC
  Administered 2012-11-17 – 2012-11-18 (×3): 25 mg via ORAL
  Filled 2012-11-17 (×5): qty 1

## 2012-11-17 NOTE — Progress Notes (Signed)
Pt ambulated in hallway 300 ft with rolling walker and 2 liters of oxygen. Pt tolerated activity well. Will continue to monitor.

## 2012-11-17 NOTE — Progress Notes (Signed)
Discussed reasons for needing to walk but unable to persuade pt to ambulate this evening. Pt stated she was very tiered. Told her we can walk anytime tonight if she changes her mind. Sharlyne Cai RN.

## 2012-11-17 NOTE — Progress Notes (Signed)
Pt ambulated in hallway 300 ft with rolling walker on room air, oxygen sats were 93 %. Pt tolerated activity well. Will continue to monitor.

## 2012-11-17 NOTE — Progress Notes (Signed)
Subjective:  Feeling better.  Not SOB, no chest pain.  Still weak.   Objective:  Vital Signs in the last 24 hours: Temp:  [97.4 F (36.3 C)-98.1 F (36.7 C)] 97.5 F (36.4 C) (04/06 0534) Pulse Rate:  [101-123] 123 (04/06 0829) Resp:  [18-19] 18 (04/06 0534) BP: (97-125)/(60-89) 114/64 mmHg (04/06 0829) SpO2:  [88 %-95 %] 95 % (04/06 0534) Weight:  [68.6 kg (151 lb 3.8 oz)] 68.6 kg (151 lb 3.8 oz) (04/06 0500)  Intake/Output from previous day: 04/05 0701 - 04/06 0700 In: 720.1 [P.O.:240; I.V.:480.1] Out: 1401 [Urine:1400; Stool:1]   Physical Exam: General: Well developed, well nourished, in no acute distress. Lungs: Clear  Heart:  irreg. No murmur, rubs or gallops.  Extremities: no edema  Lab Results:  Recent Labs  11/15/12 0535 11/17/12 0429  WBC 12.1* 9.1  HGB 11.6* 11.5*  PLT 396 508*    Recent Labs  11/15/12 0535 11/17/12 0429  NA 138 138  K 3.4* 3.7  CL 100 103  CO2 30 25  GLUCOSE 124* 106*  BUN 19 10  CREATININE 0.87 0.68    Telemetry: atrial fib rate better controlled with the addition of digoxin  Assessment/Plan:  Post op [facility] for type A dissection.  1) Afib - better controlled on dig and amio, now on po amio 2) Off coumadin with recent intramural hematoma.    I think could change to po amiodarone.  Camela Wich JR,W SPENCER 11/17/2012, 10:13 AM

## 2012-11-17 NOTE — Progress Notes (Addendum)
[address]       [place] [address]             [phone]    6 Days Post-Op  Procedure(s) (LRB): BENTALL PROCEDURE (N/A) INTRAOPERATIVE TRANSESOPHAGEAL ECHOCARDIOGRAM (N/A) Subjective: Feels better , no new complaints, fatigues fairly easily  Objective  Telemetry afib, rate better controlled  Temp:  [97.4 F (36.3 C)-98.1 F (36.7 C)] 97.5 F (36.4 C) (04/06 0534) Pulse Rate:  [101-123] 123 (04/06 0829) Resp:  [18-19] 18 (04/06 0534) BP: (97-125)/(60-89) 114/64 mmHg (04/06 0829) SpO2:  [88 %-95 %] 95 % (04/06 0534) Weight:  [151 lb 3.8 oz (68.6 kg)] 151 lb 3.8 oz (68.6 kg) (04/06 0500)   Intake/Output Summary (Last 24 hours) at 11/17/12 0938 Last data filed at 11/17/12 0200  Gross per 24 hour  Intake 720.13 ml  Output   1401 ml  Net -680.87 ml       General appearance: alert, cooperative, fatigued and no distress Heart: irregularly irregular rhythm Lungs: mildly dim in bases Abdomen: benign Extremities: minor edema Wound: incisions healing well  Lab Results:  Recent Labs  11/15/12 0535 11/17/12 0429  NA 138 138  K 3.4* 3.7  CL 100 103  CO2 30 25  GLUCOSE 124* 106*  BUN 19 10  CREATININE 0.87 0.68  CALCIUM 8.5 8.5   No results found for this basename: AST, ALT, ALKPHOS, BILITOT, PROT, ALBUMIN,  in the last 72 hours No results found for this basename: LIPASE, AMYLASE,  in the last 72 hours  Recent Labs  11/15/12 0535 11/17/12 0429  WBC 12.1* 9.1  HGB 11.6* 11.5*  HCT 35.1* 34.0*  MCV 88.0 86.3  PLT 396 508*   No results found for this basename: CKTOTAL, CKMB, TROPONINI,  in the last 72 hours No components found with this basename: POCBNP,  No results found for this basename: DDIMER,  in the last 72 hours No results found for this basename: HGBA1C,  in the last 72 hours No results found for this basename: CHOL, HDL, LDLCALC, TRIG, CHOLHDL,  in the last 72 hours No results found for this basename: TSH, T4TOTAL, FREET3,  T3FREE, THYROIDAB,  in the last 72 hours No results found for this basename: VITAMINB12, FOLATE, FERRITIN, TIBC, IRON, RETICCTPCT,  in the last 72 hours  Medications: Scheduled . amiodarone  400 mg Oral BID  . aspirin EC  81 mg Oral Daily  . bisacodyl  10 mg Oral Daily   Or  . bisacodyl  10 mg Rectal Daily  . clonazePAM  0.5 mg Oral QHS  . digoxin  0.125 mg Oral Daily  . docusate sodium  200 mg Oral Daily  . furosemide  40 mg Oral Daily  . insulin aspart  0-24 Units Subcutaneous TID AC & HS  . metoprolol tartrate  12.5 mg Oral BID  . pantoprazole  40 mg Oral Daily  . potassium chloride  40 mEq Oral Daily  . sodium chloride  3 mL Intravenous Q12H     Radiology/Studies:  Dg Chest 2 View  11/17/2012  *RADIOLOGY REPORT*  Clinical Data: Status post repair of ascending thoracic aortic dissection.  CHEST - 2 VIEW  Comparison: 11/15/2012  Findings: Significant improved aeration of both lower lungs. Residual mild left lower lobe atelectasis remains.  There are small bilateral pleural effusions.  No pulmonary edema or pneumothorax. Stable mediastinal contours.  IMPRESSION: Improved aeration of both lower lungs.  Small bilateral pleural effusions are present.   Original Report  Authenticated By: Irish Lack, M.D.     INR: Will add last result for INR, ABG once components are confirmed Will add last 4 CBG results once components are confirmed  Assessment/Plan: S/P Procedure(s) (LRB): BENTALL PROCEDURE (N/A) INTRAOPERATIVE TRANSESOPHAGEAL ECHOCARDIOGRAM (N/A)  1. Steady progress, HR better controlled with addition of digoxin 2 cont to push pulm toilet/ wean O2, diurese 3 labs stable   LOS: 9 days    GOLD,WAYNE E 4/6/20149:38 AM  patient examined and medical record reviewed,agree with above note. VAN TRIGT III,Russell Quinney 11/17/2012

## 2012-11-18 ENCOUNTER — Inpatient Hospital Stay (HOSPITAL_COMMUNITY): Payer: Medicare PPO

## 2012-11-18 LAB — CBC
HCT: 35.4 % — ABNORMAL LOW (ref 36.0–46.0)
Hemoglobin: 11.7 g/dL — ABNORMAL LOW (ref 12.0–15.0)
MCH: 29 pg (ref 26.0–34.0)
MCHC: 33.1 g/dL (ref 30.0–36.0)
MCV: 87.6 fL (ref 78.0–100.0)
Platelets: 510 10*3/uL — ABNORMAL HIGH (ref 150–400)
RBC: 4.04 MIL/uL (ref 3.87–5.11)
RDW: 15.1 % (ref 11.5–15.5)
WBC: 9.3 10*3/uL (ref 4.0–10.5)

## 2012-11-18 LAB — GLUCOSE, CAPILLARY
Glucose-Capillary: 100 mg/dL — ABNORMAL HIGH (ref 70–99)
Glucose-Capillary: 130 mg/dL — ABNORMAL HIGH (ref 70–99)
Glucose-Capillary: 121 mg/dL — ABNORMAL HIGH (ref 70–99)
Glucose-Capillary: 104 mg/dL — ABNORMAL HIGH (ref 70–99)

## 2012-11-18 LAB — BASIC METABOLIC PANEL
BUN: 10 mg/dL (ref 6–23)
CO2: 24 mEq/L (ref 19–32)
Calcium: 8.7 mg/dL (ref 8.4–10.5)
Chloride: 102 mEq/L (ref 96–112)
Creatinine, Ser: 0.76 mg/dL (ref 0.50–1.10)
GFR calc Af Amer: 88 mL/min — ABNORMAL LOW (ref >90)
GFR calc non Af Amer: 76 mL/min — ABNORMAL LOW (ref >90)
Glucose, Bld: 103 mg/dL — ABNORMAL HIGH (ref 70–99)
Potassium: 3.8 mEq/L (ref 3.5–5.1)
Sodium: 138 mEq/L (ref 135–145)

## 2012-11-18 MED ORDER — POTASSIUM CHLORIDE CRYS ER 20 MEQ PO TBCR
20.0000 meq | EXTENDED_RELEASE_TABLET | Freq: Once | ORAL | Status: AC
  Administered 2012-11-18: 20 meq via ORAL
  Filled 2012-11-18: qty 1

## 2012-11-18 MED ORDER — ENSURE COMPLETE PO LIQD
237.0000 mL | Freq: Three times a day (TID) | ORAL | Status: DC
  Administered 2012-11-18 – 2012-11-19 (×3): 237 mL via ORAL

## 2012-11-18 NOTE — Progress Notes (Signed)
Occupational Therapy Treatment Patient Details Name: [name] MRN: [id number] DOB: Jun 18, 1928 Today's Date: 11/18/2012 Time: 0347-4259 OT Time Calculation (min): 23 min  OT Assessment / Plan / Recommendation Comments on Treatment Session Pt seen following cardiac rehab and then lunch.  Continues to demonstrate decreased endurance and requires some visual and verbal cues for sternal precautions.  Will need supervision at home.  No family available to determine level of support.    Follow Up Recommendations  Home health OT;SNF;Supervision/Assistance - 24 hour    Barriers to Discharge       Equipment Recommendations       Recommendations for Other Services    Frequency Min 2X/week   Plan Discharge plan remains appropriate    Precautions / Restrictions Precautions Precaution Comments: Instructed pt in sternal precautions.   Pertinent Vitals/Pain On RA, maintaining sats in low 90s, denied pain    ADL  Grooming: Brushing hair;Teeth care;Wash/dry hands;Supervision/safety Where Assessed - Grooming: Unsupported standing Upper Body Dressing: Supervision/safety Where Assessed - Upper Body Dressing: Unsupported sitting Toilet Transfer: Performed;Min guard Toilet Transfer Method: Sit to Barista: Regular height toilet Toileting - Clothing Manipulation and Hygiene: Independent Where Assessed - Toileting Clothing Manipulation and Hygiene: Sit to stand from 3-in-1 or toilet Transfers/Ambulation Related to ADLs: min guard ambulating in hall.   ADL Comments: Pt with diarrhea, RN notified.    OT Diagnosis:    OT Problem List:   OT Treatment Interventions:     OT Goals ADL Goals Pt Will Perform Grooming: with supervision;Standing at sink ADL Goal: Grooming - Progress: Met Pt Will Perform Upper Body Dressing: with set-up;Sitting, chair;Sitting, bed ADL Goal: Upper Body Dressing - Progress: Progressing toward goals Pt Will Perform Lower Body Dressing: with  set-up;Sit to stand from chair;Sit to stand from bed Pt Will Transfer to Toilet: with supervision;Ambulation;Comfort height toilet ADL Goal: Toilet Transfer - Progress: Progressing toward goals Pt Will Perform Toileting - Clothing Manipulation: with supervision;Standing ADL Goal: Toileting - Clothing Manipulation - Progress: Met Pt Will Perform Toileting - Hygiene: with supervision;Sit to stand from 3-in-1/toilet ADL Goal: Toileting - Hygiene - Progress: Met Miscellaneous OT Goals Miscellaneous OT Goal #1: Pt will maintain sternal precautions during all ADLs and functional mobility with 1 visual cue. OT Goal: Miscellaneous Goal #1 - Progress: Progressing toward goals  Visit Information  Last OT Received On: 11/18/12 Assistance Needed: +1    Subjective Data      Prior Functioning       Cognition  Cognition Arousal/Alertness: Awake/alert Behavior During Session: [facility] for tasks performed    Mobility  Bed Mobility Bed Mobility: Sit to Supine Supine to Sit: 5: Supervision Transfers Transfers: Sit to Stand;Stand to Sit Sit to Stand: 4: Min guard;From chair/3-in-1;From toilet Stand to Sit: 4: Min guard;To bed;To toilet Details for Transfer Assistance: visual and verbal cues for sternal precautions.    Exercises      Balance Static Standing Balance Static Standing - Balance Support: No upper extremity supported;During functional activity Static Standing - Level of Assistance: 5: Stand by assistance Static Standing - Comment/# of Minutes: 5   End of Session OT - End of Session Activity Tolerance: Patient limited by fatigue Patient left: in bed;with call bell/phone within reach Nurse Communication:  (diarrhea)  GO     Evern Bio 11/18/2012, 12:39 PM [phone]

## 2012-11-18 NOTE — Progress Notes (Signed)
Pt given metoprolol, amio, and dig at this time; HR 110's; will cont to monitor.

## 2012-11-18 NOTE — Progress Notes (Signed)
EPW d/c from pacer at this time and rolled and taped; pt sitting up in chair; will transfer to regular 2000 bed today; will cont. To monitor.

## 2012-11-18 NOTE — Progress Notes (Signed)
CARDIAC REHAB PHASE I   PRE:  Rate/Rhythm: 101 afib    BP: sitting 96/62    SaO2: 93 RA  MODE:  Ambulation: 150 ft   POST:  Rate/Rhythm: 106 afib    BP: sitting 106/64     SaO2: 93 RA  Pt seems tired. C/o right upper arm hurting. Hard to determine why or how, maybe increased with RW use? Pt used RW for a few feet then decided she didn't need it. X1 LOB with correction. Pt refused to increase distance, stating her arm hurt. Return to recliner to eat. HR stable. Off O2. [id number]   [name] [place] CES, [place] 11/18/2012 12:01 PM

## 2012-11-18 NOTE — Progress Notes (Signed)
Subjective:  Says she feels poor this am. No appetite. "Pulling down" sensation in lower mid chest.   Objective:  Vital Signs in the last 24 hours: Temp:  [97.5 F (36.4 C)-97.6 F (36.4 C)] 97.6 F (36.4 C) (04/07 0500) Pulse Rate:  [103-123] 103 (04/07 0500) Resp:  [16-18] 17 (04/07 0500) BP: (106-120)/(64-93) 120/93 mmHg (04/07 0500) SpO2:  [88 %-96 %] 90 % (04/07 0500) Weight:  [66.8 kg (147 lb 4.3 oz)] 66.8 kg (147 lb 4.3 oz) (04/07 0500)  Intake/Output from previous day: 04/06 0701 - 04/07 0700 In: 120 [P.O.:120] Out: -    Physical Exam: General: [place], in no acute distress. Head:  Normocephalic and atraumatic. Lungs: Clear to auscultation and percussion. Heart: Irreg irreg, tachy (110). Soft S murmur,no rubs or gallops.  Abdomen: soft, non-tender, positive bowel sounds. Extremities: No clubbing or cyanosis. No edema. Neurologic: Alert and oriented x 3.    Lab Results:  Recent Labs  11/17/12 0429 11/18/12 0550  WBC 9.1 9.3  HGB 11.5* 11.7*  PLT 508* 510*    Recent Labs  11/17/12 0429 11/18/12 0550  NA 138 138  K 3.7 3.8  CL 103 102  CO2 25 24  GLUCOSE 106* 103*  BUN 10 10  CREATININE 0.68 0.76  Imaging: Dg Chest 2 View  11/17/2012  *RADIOLOGY REPORT*  Clinical Data: Status post repair of ascending thoracic aortic dissection.  CHEST - 2 VIEW  Comparison: 11/15/2012  Findings: Significant improved aeration of both lower lungs. Residual mild left lower lobe atelectasis remains.  There are small bilateral pleural effusions.  No pulmonary edema or pneumothorax. Stable mediastinal contours.  IMPRESSION: Improved aeration of both lower lungs.  Small bilateral pleural effusions are present.   Original Report Authenticated By: Irish Lack, M.D.    Personally viewed.   Telemetry: AFIB 110's.  Personally viewed.   Assessment/Plan:  Active Problems:   Intramural aortic hematoma  POST OP Bentall (aortic valve sparing)  1. AFIB  - Trying to improve rate  control.   - Dig 0.169mcg.   - Metoprolol 25mg  BID - did not tolerate diltiazem low BP.   - Amiodarone 400 BID (question if nausea from increased dose)   - ASA 81  - No anticoagulation  - Intramural hematoma.    Tanique Matney 11/18/2012, 8:13 AM

## 2012-11-18 NOTE — Progress Notes (Signed)
Physical Therapy Treatment Patient Details Name: [name] MRN: [id number] DOB: [birth year] Today's Date: 11/18/2012 Time:  -     PT Assessment / Plan / Recommendation Comments on Treatment Session  Pt still having poor compliance with sternal precautions requiring VCs to enforce. Pt able to ambulate without assist but Hr began to elevate limiting activity. Spoke with pt regarding continued rehab benefits breifly. Will continue to see and progress acitivty as tolerated.    Follow Up Recommendations  Home health PT;SNF;Supervision/Assistance - 24 hour     Does the patient have the potential to tolerate intense rehabilitation     Barriers to Discharge        Equipment Recommendations  Rolling walker with 5" wheels    Recommendations for Other Services OT consult  Frequency Min 3X/week   Plan Discharge plan remains appropriate;Frequency remains appropriate    Precautions / Restrictions Precautions Precautions: Sternal Precaution Comments: Instructed pt in sternal precautions.   Pertinent Vitals/Pain Some pain over incision reported    Mobility  Bed Mobility Bed Mobility: Sit to Supine Supine to Sit: 5: Supervision Transfers Transfers: Sit to Stand;Stand to Sit Sit to Stand: 4: Min guard;From chair/3-in-1;From toilet Stand to Sit: 4: Min guard;To bed;To toilet Details for Transfer Assistance: visual and verbal cues for sternal precautions. Ambulation/Gait Ambulation/Gait Assistance: 4: Min guard Ambulation Distance (Feet): 200 Feet Assistive device: None Ambulation/Gait Assistance Details: mild unsteadiness with ambulation but no significant LOB identified today Gait Pattern: Decreased stride length;Narrow base of support Gait velocity: decreased General Gait Details: limited due to fatigue and increased HR    Exercises General Exercises - Lower Extremity Ankle Circles/Pumps: AROM;Both;20 reps    PT Goals Acute Rehab PT Goals PT Goal: Supine/Side to Sit - Progress:  Progressing toward goal PT Goal: Sit to Stand - Progress: Progressing toward goal PT Goal: Stand to Sit - Progress: Progressing toward goal PT Goal: Ambulate - Progress: Progressing toward goal  Visit Information  Last PT Received On: 11/18/12 Assistance Needed: +1    Subjective Data      Cognition  Cognition Arousal/Alertness: Awake/alert Behavior During Session: [facility] for tasks performed    Balance  Balance Balance Assessed: Yes Static Standing Balance Static Standing - Balance Support: No upper extremity supported;During functional activity Static Standing - Level of Assistance: 5: Stand by assistance Static Standing - Comment/# of Minutes: 3  End of Session PT - End of Session Equipment Utilized During Treatment: Gait belt;Oxygen Activity Tolerance: Patient limited by fatigue;Treatment limited secondary to medical complications (Comment) Patient left: in chair;with call bell/phone within reach;with restraints reapplied Nurse Communication: Mobility status   GP     Fabio Asa 11/18/2012, 3:58 PM Charlotte Crumb, PT DPT  [phone]

## 2012-11-18 NOTE — Progress Notes (Addendum)
                   [address]            [name] [address]          [phone]      7 Days Post-Op Procedure(s) (LRB): BENTALL PROCEDURE (N/A) INTRAOPERATIVE TRANSESOPHAGEAL ECHOCARDIOGRAM (N/A)  Subjective: Patient states her chest felt like it was "pulling down and heavy" earlier. Does not have much appetite.  Objective: Vital signs in last 24 hours: Temp:  [97.5 F (36.4 C)-97.6 F (36.4 C)] 97.6 F (36.4 C) (04/07 0500) Pulse Rate:  [103-119] 103 (04/07 0500) Cardiac Rhythm:  [-] Atrial fibrillation (04/07 0835) Resp:  [16-18] 17 (04/07 0500) BP: (106-120)/(74-93) 120/93 mmHg (04/07 0500) SpO2:  [88 %-96 %] 90 % (04/07 0500) Weight:  [66.8 kg (147 lb 4.3 oz)] 66.8 kg (147 lb 4.3 oz) (04/07 0500)  Pre op weight  65 kg Current Weight  11/18/12 66.8 kg (147 lb 4.3 oz)      Intake/Output from previous day: 04/06 0701 - 04/07 0700 In: 120 [P.O.:120] Out: -    Physical Exam:  Cardiovascular: [name] Pulmonary: Clear to auscultation bilaterally; no rales, wheezes, or rhonchi. Abdomen: Soft, non tender, bowel sounds present. Extremities: Trace bilateral lower extremity edema. Wounds: Clean and dry.  No erythema or signs of infection.  Lab Results: CBC: Recent Labs  11/17/12 0429 11/18/12 0550  WBC 9.1 9.3  HGB 11.5* 11.7*  HCT 34.0* 35.4*  PLT 508* 510*   BMET:  Recent Labs  11/17/12 0429 11/18/12 0550  NA 138 138  K 3.7 3.8  CL 103 102  CO2 25 24  GLUCOSE 106* 103*  BUN 10 10  CREATININE 0.68 0.76  CALCIUM 8.5 8.7    PT/INR:  Lab Results  Component Value Date   INR 1.64* 11/11/2012   INR 1.25 11/11/2012   INR 1.32 11/10/2012   ABG:  INR: Will add last result for INR, ABG once components are confirmed Will add last 4 CBG results once components are confirmed  Assessment/Plan:  1. CV - A fib with RVR. On Amiodarone 400 bid, Digoxin 0.125 daily, and Lopressor 25 bid. HR 110-120's this am. Give Lopressor now. No  Coumadin as with intramural hematoma. 2.  Pulmonary - CXR this am shows cardiomegaly, bibasilar atelectasis, low lung volumes, no pneumothorax, and small bilateral pleural effusions. 3. Volume Overload - On Lasix 40 daily 4.  Acute blood loss anemia - H and H stable at 11.7 and 35.4 5.Supplement potassium 6.Ensure tid  ZIMMERMAN,[name] MPA-C 11/18/2012,8:40 AM  Chronic atrial fibrillation under better control, may need more beta blocker. Should be ready for skilled nursing facility tomorrow. No Coumadin because of recent type a dissection

## 2012-11-18 NOTE — Progress Notes (Signed)
Referral received today for SNF placement. Met with pt to discuss her going to a rehab to continue her PT and OT.        I asked the pt's permission to speak with her dtr. CSW left a list of SNF's for [place] and surrounding areas.  Assessment to follow.   Sherald Barge, LCSW-A Clinical Social Worker [phone]

## 2012-11-18 NOTE — Progress Notes (Signed)
OT Note:  Splint order received per Dr. Morton Peters on 11/17/12.  Evaluated pt and inquired with RN regarding splint order.  No splint needs identified.  11/18/2012 Martie Round, OTR/L Pager: [phone]

## 2012-11-18 NOTE — Clinical Social Work Note (Signed)
Clinical Social Worker spoke with patient at bedside and patient daughter over the phone.  Patient and patient daughter have agreed to accept bed offer at [facility] - CSW contacted facility and facility agreeable to admission.  CSW left a message with [facility]) in hopes of initiating insurance authorization and answering family questions regarding copays.  Patient may be ready for discharge tomorrow - CSW remains available for support and to facilitate patient discharge needs once medically stable.  Macario Golds, LCSW [phone]  (Covering Frederico Hamman, [place])

## 2012-11-18 NOTE — Progress Notes (Signed)
Pt transferred from 2018 to 2028; pt ambulated 100 feet to new room; pt assisted in chair; call bell w/i reach; no needs voiced at this time; pt denies pain; will cont. To monitor.

## 2012-11-19 LAB — GLUCOSE, CAPILLARY
Glucose-Capillary: 130 mg/dL — ABNORMAL HIGH (ref 70–99)
Glucose-Capillary: 109 mg/dL — ABNORMAL HIGH (ref 70–99)

## 2012-11-19 MED ORDER — ASPIRIN 81 MG PO TBEC: 81.0000 mg | DELAYED_RELEASE_TABLET | Freq: Every day | ORAL | Status: DC

## 2012-11-19 MED ORDER — FUROSEMIDE 40 MG PO TABS: 40.0000 mg | ORAL_TABLET | Freq: Every day | ORAL | Status: DC

## 2012-11-19 MED ORDER — METOPROLOL TARTRATE 12.5 MG HALF TABLET: 37.5000 mg | ORAL_TABLET | Freq: Two times a day (BID) | ORAL | Status: DC

## 2012-11-19 MED ORDER — TRAMADOL HCL 50 MG PO TABS: 50.0000 mg | ORAL_TABLET | Freq: Four times a day (QID) | ORAL | Status: DC | PRN

## 2012-11-19 MED ORDER — DIGOXIN 125 MCG PO TABS: 0.1250 mg | ORAL_TABLET | Freq: Every day | ORAL | Status: DC

## 2012-11-19 MED ORDER — POTASSIUM CHLORIDE CRYS ER 20 MEQ PO TBCR: 40.0000 meq | EXTENDED_RELEASE_TABLET | Freq: Every day | ORAL | Status: DC

## 2012-11-19 MED ORDER — ENSURE COMPLETE PO LIQD: 237.0000 mL | Freq: Three times a day (TID) | ORAL | Status: DC

## 2012-11-19 MED ORDER — METOPROLOL TARTRATE 25 MG PO TABS
37.5000 mg | ORAL_TABLET | Freq: Two times a day (BID) | ORAL | Status: DC
  Administered 2012-11-19: 37.5 mg via ORAL
  Filled 2012-11-19 (×2): qty 1

## 2012-11-19 MED ORDER — AMIODARONE HCL 400 MG PO TABS: 200.0000 mg | ORAL_TABLET | Freq: Two times a day (BID) | ORAL | Status: DC

## 2012-11-19 NOTE — Progress Notes (Signed)
Pt declined walking now. Wants to go with RN in 1 hr. Ed completed through phone interpreter. Gave brochure of [facility] and pt can call in future if interested.  [id number] Ethelda Chick CES, ACSM 11/19/2012 2:37 PM

## 2012-11-19 NOTE — Progress Notes (Signed)
Physical Therapy Treatment Patient Details Name: [name] MRN: [id number] DOB: [birth year] Today's Date: 11/19/2012 Time: 4098-1191 PT Time Calculation (min): 19 min  PT Assessment / Plan / Recommendation Comments on Treatment Session  Pt continues to demonstrate decreased activity tolerance and mild instability with activity. Pt requires multiple VCs for sternal precaution compliance. Feel patient will continue to need 24/7 supervision and intermittant assist PRN. At this time, daughter is unable to provide 24 hour supervision and assist. Rec ST SNF stay to address deficits and safety concerns to maximize functional moblity progression to independence.     Follow Up Recommendations  SNF     Does the patient have the potential to tolerate intense rehabilitation     Barriers to Discharge  No 24 hr supervision or assist; multiple stairs to navigate      Equipment Recommendations  Rolling walker with 5" wheels    Recommendations for Other Services OT consult  Frequency Min 3X/week   Plan Discharge plan remains appropriate;Frequency remains appropriate    Precautions / Restrictions Precautions Precautions: Sternal Precaution Comments: Instructed pt in sternal precautions. Restrictions Weight Bearing Restrictions: No   Pertinent Vitals/Pain Patient reports "some pain" around incision site; unable to communicate VAS value    Mobility  Bed Mobility Bed Mobility: Sit to Supine Supine to Sit: 5: Supervision Sitting - Scoot to Edge of Bed: 5: Supervision Details for Bed Mobility Assistance: VCs for sternal precautions and technique Transfers Transfers: Sit to Stand;Stand to Sit Sit to Stand: 4: Min guard;From chair/3-in-1;From toilet Stand to Sit: 4: Min guard;To bed;To toilet Details for Transfer Assistance: VCs for sternal precaution compliance Ambulation/Gait Ambulation/Gait Assistance: 4: Min guard Ambulation Distance (Feet): 240 Feet Assistive device: None Ambulation/Gait  Assistance Details: Pt continues to demonstrate decreased stability with ambulation but no significant loss of balance noted.  Gait Pattern: Decreased stride length;Narrow base of support Gait velocity: decreased General Gait Details: Pt with limited activity tolerance secondary to fatigue, multiple rest breaks taken with ambulation Stairs: No    Exercises General Exercises - Lower Extremity Ankle Circles/Pumps: AROM;Both;20 reps Long Arc Quad: AROM;Both;10 reps Hip Flexion/Marching: AROM;Both;10 reps     PT Goals Acute Rehab PT Goals PT Goal Formulation: Patient unable to participate in goal setting Time For Goal Achievement: 11/27/12 Potential to Achieve Goals: Good Pt will go Supine/Side to Sit: with modified independence PT Goal: Supine/Side to Sit - Progress: Progressing toward goal Pt will go Sit to Supine/Side: with modified independence PT Goal: Sit to Supine/Side - Progress: Progressing toward goal Pt will go Sit to Stand: with modified independence PT Goal: Sit to Stand - Progress: Progressing toward goal Pt will go Stand to Sit: with modified independence PT Goal: Stand to Sit - Progress: Progressing toward goal Pt will Ambulate: >150 feet;with modified independence PT Goal: Ambulate - Progress: Progressing toward goal Pt will Go Up / Down Stairs: Flight;with min assist PT Goal: Up/Down Stairs - Progress: Not met  Visit Information  Last PT Received On: 11/19/12 Assistance Needed: +1    Subjective Data  Subjective: "I want to sit in chair"   Cognition  Cognition Arousal/Alertness: Awake/alert Behavior During Session: [facility] for tasks performed    Balance  Balance Balance Assessed: Yes Static Standing Balance Static Standing - Balance Support: No upper extremity supported;During functional activity Static Standing - Level of Assistance: 5: Stand by assistance Static Standing - Comment/# of Minutes: some increased instability with static balance today, but  patient was able to self correct  End of  Session PT - End of Session Equipment Utilized During Treatment: Gait belt;Oxygen Activity Tolerance: Patient limited by fatigue;Treatment limited secondary to medical complications (Comment) Patient left: in chair;with call bell/phone within reach;with restraints reapplied Nurse Communication: Mobility status   GP     Fabio Asa 11/19/2012, 11:24 AM Charlotte Crumb, PT DPT  [phone]

## 2012-11-19 NOTE — Progress Notes (Signed)
CT sutures removed per order. Benzoin and steri strips applied. Pt instructed to leave steri strips on until they dissolve. Verbalized understanding. Will continue to monitor pt.

## 2012-11-19 NOTE — Progress Notes (Signed)
Covering Clinical Child psychotherapist (CSW) has submitted clinicals to [employer] and [employer], pt insurance company. CSW awaiting an update from [facility] to determine whether an authorization will be given for SNF placement. CSW has informed pt daughter Ms. Katrinka Blazing of possibility of a denial from pt insurance. CSW to remain following.  Theresia Bough, MSW, Theresia Majors [phone]

## 2012-11-19 NOTE — Discharge Summary (Signed)
[address]       [place] [address]             [phone]     [name] January 20, 1928 79 y.o. [id number]  11/08/2012   Kerin Perna, MD   intramural aortic hematoma   HPI:  79 year old [place] female with hypertension but nonsmoker who had an episode of severe chest pain approximately 2 and half to 3 weeks ago described as a "band of pain" when she was lifting up some furniture. She thought she was going to die but did not seek immediate medical attention but is rested for 24-48 hours of gradual improvement in the pain. She is currently asymptomatic. She was evaluated by Dr. Anne Fu for cardiac evaluation at the request of her primary care physician. Dr. Anne Fu ordered a CTA of the thoracic aorta and performed a echocardiogram. The echocardiogram showed good function without significant valvular disease. The CTA showed an ascending fusiform aneurysm with intramural hematoma extending from the aortic root around to the descending thoracic aorta without active extravasation. There is no pericardial effusion.  The patient has long history of atrial fibrillation and is on chronic Coumadin therapy. Her Coumadin is on hold. She had a DC cardioversion proximally 3 years ago in [place] for A. fib. Her Coumadin was stopped, and she was admitted to the hospital to follow her INR in preparation for surgical repair.  Past Medical History   Diagnosis  Date   .  Afib    .  Aortic root aneurysm    .  Hypertension    No past surgical history on file. no prior surgical procedures  No family history on file. no history of thoracic or bowel aneurysm in the family  Social History  History   Substance Use Topics   .  Smoking status:  Never Smoker   .  Smokeless tobacco:  Not on file   .  Alcohol Use:  No    Current Outpatient Prescriptions   Medication  Sig  Dispense  Refill   .  acetaminophen (TYLENOL) 325 MG tablet  Take 650 mg by mouth every 6 (six) hours as needed for pain.      Marland Kitchen  amiodarone (PACERONE) 100 MG tablet  Take 100 mg by mouth daily.     Marland Kitchen  amLODipine (NORVASC) 10 MG tablet  Take 10 mg by mouth daily.     Marland Kitchen  warfarin (COUMADIN) 5 MG tablet  Take 5 mg by mouth daily.      No current facility-administered medications for this visit.   No Known Allergies  Review of Systems  No history of thoracic trauma  No history of mini stroke DVT claudication TIA  No history of recent pulmonary infection tuberculosis hemoptysis  No history of diabetes  No history DVT  No difficulty swallowing  Lives with her daughter but tries to remain fairly active  BP 140/89  Pulse 96  Resp 20  Ht 5' 0.75" (1.543 m)  Wt 149 lb (67.586 kg)  BMI 28.39 kg/m2  SpO2 98%  Physical Exam  Gen. appearance fairly healthy appearing 79 year old woman no acute distress  HEENT normocephalic pupils equal dentition good  Neck good carotid pulses no breathing no JVD  Thorax no deformity clear breath sounds no tenderness  COR no murmur irregular rhythm with A. Fib  Abdomen soft nontender without pulsatile mass  Extremities warm good pulses  Neuro alert and oriented no focal motor deficit  Diagnostic Tests:  CTA  of thoracic aorta personally reviewed patient discussed with Dr. Anne Fu. 2-D echo report from [facility] cardiology reviewed     Hospital Course: The patient was admitted for observation after her Coumadin was discontinued and she was scheduled for surgery. On 11/11/2012 he was taken the operating room where she underwent the following procedure:   DATE OF PROCEDURE: 11/11/2012  DATE OF DISCHARGE:  OPERATIVE REPORT  OPERATION:  1. Repair of subacute type A ascending aortic dissection with  replacement of the ascending aorta with a 30 mm Hemashield Dacron  graft and resuspension of the aortic valve.  2. Right axillary cannulation for cardiopulmonary bypass.  3. Hypothermic circulatory arrest for proximal arch reconstruction.  PREOPERATIVE DIAGNOSIS: Subacute type A dissection  with intramural  hematoma and recent sudden onset of severe pain.  POSTOPERATIVE DIAGNOSIS: Subacute type A dissection with intramural  hematoma and recent sudden onset of severe pain.  SURGEON: Kerin Perna, M.D.  ASSISTANT: Rowe Clack, P.A.-C., and Benay Spice, RNFA.  ANESTHESIA: General by Judie Petit, M.D. The patient returned to the ICU in critical, but stable  condition.  Post operative hospital course:  The patient remained intubated overnight.she was then extubated without difficulty. She has remained neurologically intact.She has remained hemodynamically stable and inotropic support was weaned without significant difficulty.she has had postoperative atrial fibrillation and over time rate control has improved. Medication regimen has been adjusted over time and of note she is not a candidate for Coumadin due to the recent thoracic hematoma. She has had some moderate volume overload but has responded well to diuretics.all routine lines, monitors and drainage devices have been discontinued in the standard fashion.her blood sugars have been controlled using standard protocols.she has been somewhat slow in her overall physical recovery and is felt to require skilled nursing facility for ongoing rehabilitation.she is felt to be stable for transfer to that facility when a bed becomes available.    Recent Labs  11/17/12 0429 11/18/12 0550  NA 138 138  K 3.7 3.8  CL 103 102  CO2 25 24  GLUCOSE 106* 103*  BUN 10 10  CALCIUM 8.5 8.7    Recent Labs  11/17/12 0429 11/18/12 0550  WBC 9.1 9.3  HGB 11.5* 11.7*  HCT 34.0* 35.4*  PLT 508* 510*   No results found for this basename: INR,  in the last 72 hours   Discharge Instructions:  The patient is discharged to home with extensive instructions on wound care and progressive ambulation.  They are instructed not to drive or perform any heavy lifting until returning to see the physician in his office.  Discharge Diagnosis:    intramural aortic hematoma  Ascending aortic aneurysm  Secondary Diagnosis: Patient Active Problem List  Diagnosis  . Afib  . Aortic root aneurysm  . Hypertension  . Intramural aortic hematoma   Past Medical History  Diagnosis Date  . Afib   . Aortic root aneurysm   . Hypertension         Medication List    STOP taking these medications       acetaminophen 325 MG tablet  Commonly known as:  TYLENOL     amLODipine 10 MG tablet  Commonly known as:  NORVASC     warfarin 5 MG tablet  Commonly known as:  COUMADIN      TAKE these medications       amiodarone 400 MG tablet  Commonly known as:  PACERONE  Take 0.5 tablets (200 mg total) by mouth 2 (  two) times daily.     aspirin 81 MG EC tablet  Take 1 tablet (81 mg total) by mouth daily.     digoxin 0.125 MG tablet  Commonly known as:  LANOXIN  Take 1 tablet (0.125 mg total) by mouth daily.     feeding supplement Liqd  Take 237 mLs by mouth 3 (three) times daily with meals.     furosemide 40 MG tablet  Commonly known as:  LASIX  Take 1 tablet (40 mg total) by mouth daily.     metoprolol tartrate 12.5 mg Tabs  Commonly known as:  LOPRESSOR  Take 1.5 tablets (37.5 mg total) by mouth 2 (two) times daily.     potassium chloride SA 20 MEQ tablet  Commonly known as:  K-DUR,KLOR-CON  Take 2 tablets (40 mEq total) by mouth daily.     traMADol 50 MG tablet  Commonly known as:  ULTRAM  Take 1 tablet (50 mg total) by mouth every 6 (six) hours as needed.       Follow-up Information   Follow up with VAN Dinah Beers, MD. (12/11/2012 at 2 PM to see the surgeon. Please obtain a chest x-ray at [facility] imaging at 1 PM . [facility] imaging is located in the same office complex.)    Contact information:   [address] [place] [address] [phone]       Follow up with Donato Schultz, MD. (2 weeks-please contact his office to arrange this appointment.)    Contact information:   [address]  [address] [place] [address] [phone]      Disposition: for discharge to skilled nursing facility  Patient's condition is Good  Gershon Crane, PA-C 11/19/2012  9:14 AM  The patient has been discharged on:   1.Beta Blocker:  Yes [ y  ]                              No   [   ]                              If No, reason:  2.Ace Inhibitor/ARB: Yes [   ]                                     No  [  n  ]                                     If No, reason: low relative BP post op  3.Statin:   Yes [   ]                  No  [   ]                  If No, reason:no coronary disease in 79 year old female  4.Marlowe KaysValentino Hue  [  y ]                  No   [   ]                  If No, reason:

## 2012-11-19 NOTE — Progress Notes (Signed)
EPW removed per order. No drainage noted.  Pt tolerated procedure well. BP pre removal was 90/45 and after removal was 91/62. VS to be taken every 15 mins x 1hour. Pt instructed to lie flat in bed for one hour. Pt placed on bed alarm for reminder.

## 2012-11-19 NOTE — Progress Notes (Signed)
Pt discharged to [facility] via [facility], with belongings, copy of instructions. Attempted to call report x2, put on long hold. Will retry Egbert Garibaldi A

## 2012-11-19 NOTE — Progress Notes (Signed)
Clinical Social Worker (CSW) has received [employer] authorization ([id number]) for SNF placement. CSW contacted pt daughter who was very appreciative and will sign paperwork for pt at the facility. CSW has prepared and placed dc packet in shadow chart. CSW has requested RN to contact [facility] [phone]) to give report. CSW has contacted [facility] for transport. No additional needs, CSW is signing off.  Theresia Bough, MSW, Theresia Majors  [phone]

## 2012-11-19 NOTE — Progress Notes (Signed)
Subjective:  No major complaints. Weak, did not walk as much as much as she should yesterday.   Objective:  Vital Signs in the last 24 hours: Temp:  [97.2 F (36.2 C)-98 F (36.7 C)] 97.2 F (36.2 C) (04/08 0518) Pulse Rate:  [101-108] 104 (04/08 0518) Resp:  [17-18] 17 (04/08 0518) BP: (106-109)/(73-77) 109/75 mmHg (04/08 0518) SpO2:  [92 %-94 %] 93 % (04/08 0518) Weight:  [67 kg (147 lb 11.3 oz)] 67 kg (147 lb 11.3 oz) (04/08 0518)  Intake/Output from previous day: 04/07 0701 - 04/08 0700 In: 120 [P.O.:120] Out: 125 [Urine:125]   Physical Exam: General:  in no acute distress. Head:  Normocephalic and atraumatic. Lungs: Clear to auscultation and percussion. Heart: Irreg   No murmur, rubs or gallops.  Abdomen: soft, non-tender, positive bowel sounds. Extremities: No clubbing or cyanosis. No edema. Neurologic: Alert and oriented x 3.    Lab Results:  Recent Labs  11/17/12 0429 11/18/12 0550  WBC 9.1 9.3  HGB 11.5* 11.7*  PLT 508* 510*    Recent Labs  11/17/12 0429 11/18/12 0550  NA 138 138  K 3.7 3.8  CL 103 102  CO2 25 24  GLUCOSE 106* 103*  BUN 10 10  CREATININE 0.68 0.76   Imaging: Dg Chest Port 1 View  11/18/2012  *RADIOLOGY REPORT*  Clinical Data: [facility]  PORTABLE CHEST - 1 VIEW  Comparison: Yesterday  Findings: Stable tortuous aorta.  Mild cardiomegaly.  Mild bibasilar atelectasis increased with lower lung volumes.  No pneumothorax.  Small pleural effusions not significantly changed.  IMPRESSION: Lower lung volumes with increased basilar atelectasis.  No sign of edema.   Original Report Authenticated By: Jolaine Click, M.D.    Personally viewed.   Telemetry: AFIB 99 currently Personally viewed.    Assessment/Plan:  Active Problems:   Intramural aortic hematoma  AFIB  - dig, amio, may increase Bb.   - ASA  - rate control improved but still increased with activity.   Hopeful to SNF soon.      [name] 11/19/2012, 8:45 AM

## 2012-11-19 NOTE — Progress Notes (Signed)
[name]       [place] [address]             [phone]    8 Days Post-Op  Procedure(s) (LRB): BENTALL PROCEDURE (N/A) INTRAOPERATIVE TRANSESOPHAGEAL ECHOCARDIOGRAM (N/A) Subjective: Feels ok, chest is sore  Objective  Telemetry afib , still tachy to 120's   Temp:  [97.2 F (36.2 C)-98 F (36.7 C)] 97.2 F (36.2 C) (04/08 0518) Pulse Rate:  [101-108] 104 (04/08 0518) Resp:  [17-18] 17 (04/08 0518) BP: (106-109)/(73-77) 109/75 mmHg (04/08 0518) SpO2:  [92 %-94 %] 93 % (04/08 0518) Weight:  [147 lb 11.3 oz (67 kg)] 147 lb 11.3 oz (67 kg) (04/08 0518)   Intake/Output Summary (Last 24 hours) at 11/19/12 0811 Last data filed at 11/19/12 0156  Gross per 24 hour  Intake    120 ml  Output    125 ml  Net     -5 ml       General appearance: alert, cooperative and no distress Heart: irregularly irregular rhythm Lungs: clear to auscultation bilaterally Abdomen: benign Extremities: no edema Wound: incisions healing well  Lab Results:  Recent Labs  11/17/12 0429 11/18/12 0550  NA 138 138  K 3.7 3.8  CL 103 102  CO2 25 24  GLUCOSE 106* 103*  BUN 10 10  CREATININE 0.68 0.76  CALCIUM 8.5 8.7   No results found for this basename: AST, ALT, ALKPHOS, BILITOT, PROT, ALBUMIN,  in the last 72 hours No results found for this basename: LIPASE, AMYLASE,  in the last 72 hours  Recent Labs  11/17/12 0429 11/18/12 0550  WBC 9.1 9.3  HGB 11.5* 11.7*  HCT 34.0* 35.4*  MCV 86.3 87.6  PLT 508* 510*   No results found for this basename: CKTOTAL, CKMB, TROPONINI,  in the last 72 hours No components found with this basename: POCBNP,  No results found for this basename: DDIMER,  in the last 72 hours No results found for this basename: HGBA1C,  in the last 72 hours No results found for this basename: CHOL, HDL, LDLCALC, TRIG, CHOLHDL,  in the last 72 hours No results found for this basename: TSH, T4TOTAL, FREET3, T3FREE, THYROIDAB,  in the last 72  hours No results found for this basename: VITAMINB12, FOLATE, FERRITIN, TIBC, IRON, RETICCTPCT,  in the last 72 hours  Medications: Scheduled . amiodarone  400 mg Oral BID  . aspirin EC  81 mg Oral Daily  . bisacodyl  10 mg Oral Daily   Or  . bisacodyl  10 mg Rectal Daily  . clonazePAM  0.5 mg Oral QHS  . digoxin  0.125 mg Oral Daily  . docusate sodium  200 mg Oral Daily  . feeding supplement  237 mL Oral TID WC  . furosemide  40 mg Oral Daily  . insulin aspart  0-24 Units Subcutaneous TID AC & HS  . metoprolol tartrate  25 mg Oral BID  . pantoprazole  40 mg Oral Daily  . potassium chloride  40 mEq Oral Daily  . sodium chloride  3 mL Intravenous Q12H     Radiology/Studies:  Dg Chest Port 1 View  11/18/2012  *RADIOLOGY REPORT*  Clinical Data: [facility]  PORTABLE CHEST - 1 VIEW  Comparison: Yesterday  Findings: Stable tortuous aorta.  Mild cardiomegaly.  Mild bibasilar atelectasis increased with lower lung volumes.  No pneumothorax.  Small pleural effusions not significantly changed.  IMPRESSION: Lower lung volumes with increased basilar atelectasis.  No sign of  edema.   Original Report Authenticated By: Jolaine Click, M.D.     INR: Will add last result for INR, ABG once components are confirmed Will add last 4 CBG results once components are confirmed  Assessment/Plan: S/P Procedure(s) (LRB): BENTALL PROCEDURE (N/A) INTRAOPERATIVE TRANSESOPHAGEAL ECHOCARDIOGRAM (N/A)  1 will increase beta blocker 2 cont gentle diuresis 3 d/c epw's, sutures 4 poss tx to snf today  LOS: 11 days    [name] 4/8/20148:11 AM

## 2012-11-20 NOTE — Discharge Summary (Signed)
patient examined and medical record reviewed,agree with above note. VAN TRIGT III,Vergene Marland 11/20/2012    

## 2012-11-20 NOTE — H&P (Signed)
patient examined and medical record reviewed,agree with above note. VAN TRIGT III,Kanon Colunga 11/20/2012

## 2012-12-09 ENCOUNTER — Other Ambulatory Visit: Payer: Self-pay | Admitting: *Deleted

## 2012-12-09 DIAGNOSIS — I719 Aortic aneurysm of unspecified site, without rupture: Secondary | ICD-10-CM

## 2012-12-11 ENCOUNTER — Ambulatory Visit: Payer: Self-pay | Admitting: Cardiothoracic Surgery

## 2012-12-13 ENCOUNTER — Ambulatory Visit (INDEPENDENT_AMBULATORY_CARE_PROVIDER_SITE_OTHER): Payer: Medicare PPO | Admitting: Cardiothoracic Surgery

## 2012-12-13 ENCOUNTER — Encounter: Payer: Self-pay | Admitting: Cardiothoracic Surgery

## 2012-12-13 ENCOUNTER — Ambulatory Visit
Admission: RE | Admit: 2012-12-13 | Discharge: 2012-12-13 | Disposition: A | Discharge status: Home / Self Care | Payer: Medicare PPO | Source: Ambulatory Visit | Attending: Cardiothoracic Surgery | Admitting: Cardiothoracic Surgery

## 2012-12-13 VITALS — BP 131/85 | HR 93 | Temp 97.2°F | Resp 18 | Ht 61.0 in | Wt 147.0 lb

## 2012-12-13 DIAGNOSIS — Z8679 Personal history of other diseases of the circulatory system: Secondary | ICD-10-CM

## 2012-12-13 DIAGNOSIS — I719 Aortic aneurysm of unspecified site, without rupture: Secondary | ICD-10-CM

## 2012-12-13 DIAGNOSIS — Z9889 Other specified postprocedural states: Secondary | ICD-10-CM

## 2012-12-13 NOTE — Progress Notes (Signed)
PCP is Emeterio Reeve, MD Referring Provider is Donato Schultz, MD  Chief Complaint  Patient presents with  . Routine Post Op    F/U from surgey with CXR, S/P Bentall procedure on 11/11/12    HPI: 79 year old [place] female status post urgent resection and grafting of  ascending thoracic aortic dissection, type A, 3 weeks ago. Aortic valve was resuspended. She has chronic atrial fibrillation and was on amiodarone and Coumadin preoperatively. She has hypertension.  Since returning home she is been nauseated. Her appetite has been poor. She is been depressed. She's been having difficulty sleeping. She's had significant problems with constipation.  She denies symptoms of CHF or angina. She denies syncope. Postoperative incisional pain has been minimal. The surgical incisions are healing well-sternotomy and right infraclavicular incision for actually artery cannulation both healing well.  Past Medical History  Diagnosis Date  . Afib   . Aortic root aneurysm   . Hypertension     Past Surgical History  Procedure Laterality Date  . Bentall procedure N/A 11/11/2012    Procedure: BENTALL PROCEDURE;  Surgeon: Kerin Perna, MD;  Location: [facility] OR;  Service: Open Heart Surgery;  Laterality: N/A;  *[facility] ARREST*   . Intraoperative transesophageal echocardiogram N/A 11/11/2012    Procedure: INTRAOPERATIVE TRANSESOPHAGEAL ECHOCARDIOGRAM;  Surgeon: Kerin Perna, MD;  Location: [facility] OR;  Service: Open Heart Surgery;  Laterality: N/A;    No family history on file.  Social History History  Substance Use Topics  . Smoking status: Never Smoker   . Smokeless tobacco: Not on file  . Alcohol Use: No    Current Outpatient Prescriptions  Medication Sig Dispense Refill  . amiodarone (PACERONE) 400 MG tablet Take 0.5 tablets (200 mg total) by mouth 2 (two) times daily.      Marland Kitchen aspirin EC 81 MG EC tablet Take 1 tablet (81 mg total) by mouth daily.      . digoxin (LANOXIN) 0.125 MG tablet Take 1 tablet  (0.125 mg total) by mouth daily.      . furosemide (LASIX) 40 MG tablet Take 1 tablet (40 mg total) by mouth daily.  7 tablet  0  . magnesium hydroxide (MILK OF MAGNESIA) 400 MG/5ML suspension Take 30 mLs by mouth daily as needed for constipation.      . metoprolol tartrate (LOPRESSOR) 12.5 mg TABS Take 1.5 tablets (37.5 mg total) by mouth 2 (two) times daily.      . potassium chloride SA (K-DUR,KLOR-CON) 20 MEQ tablet Take 2 tablets (40 mEq total) by mouth daily.  14 tablet  0  . senna (SENOKOT) 8.6 MG tablet Take 1 tablet by mouth 2 (two) times daily.      . traMADol (ULTRAM) 50 MG tablet Take 1 tablet (50 mg total) by mouth every 6 (six) hours as needed.  50 tablet  0   No current facility-administered medications for this visit.    No Known Allergies  Review of Systems as per present illness  BP 131/85  Pulse 93  Temp(Src) 97.2 F (36.2 C)  Resp 18  Ht 5\' 1"  (1.549 m)  Wt 147 lb (66.679 kg)  BMI 27.79 kg/m2  SpO2 97% Physical Exam Alert comfortable accompanied by daughter Breath sounds clear and equal Sternal stable incision well-healed Heart rate irregular atrial fibrillation, no aortic murmur Abdomen soft Extremities warm with good pulses no edema Neuro intact  Diagnostic Tests: Chest x-ray with no pleural effusion no evidence of CHF sternal wires intact cardiac silhouette stable  Impression:  Slow progress 3 weeks after urgent repair of type A dissection. Nausea and constipation probably medication related and will stop potassium, Lasix, digoxin, and reduce amiodarone to only 100 mg by mouth daily and increase metoprolol to 50 mg twice a day for her persistent elevated heart rate and blood pressure Will try low-dose of Remeron 15 mg by mouth each bedtime for her insomnia, poor appetite, depression   Plan: Return for review of progress in 3 weeks. Patient recommended to take a 10 minute walk daily. Continued until Coumadin since she is early postop aortic  dissection-continue aspirin 81 mg

## 2012-12-30 ENCOUNTER — Other Ambulatory Visit: Payer: Self-pay | Admitting: *Deleted

## 2012-12-30 DIAGNOSIS — I7100 Dissection of unspecified site of aorta: Secondary | ICD-10-CM

## 2012-12-30 DIAGNOSIS — I719 Aortic aneurysm of unspecified site, without rupture: Secondary | ICD-10-CM

## 2013-01-01 ENCOUNTER — Ambulatory Visit (INDEPENDENT_AMBULATORY_CARE_PROVIDER_SITE_OTHER): Payer: Self-pay | Admitting: Cardiothoracic Surgery

## 2013-01-01 ENCOUNTER — Ambulatory Visit
Admission: RE | Admit: 2013-01-01 | Discharge: 2013-01-01 | Disposition: A | Discharge status: Home / Self Care | Payer: Medicare PPO | Source: Ambulatory Visit | Attending: Cardiothoracic Surgery | Admitting: Cardiothoracic Surgery

## 2013-01-01 ENCOUNTER — Encounter: Payer: Self-pay | Admitting: Cardiothoracic Surgery

## 2013-01-01 VITALS — BP 135/93 | HR 104 | Resp 20 | Ht 61.0 in | Wt 147.0 lb

## 2013-01-01 DIAGNOSIS — Z8679 Personal history of other diseases of the circulatory system: Secondary | ICD-10-CM

## 2013-01-01 DIAGNOSIS — I7100 Dissection of unspecified site of aorta: Secondary | ICD-10-CM

## 2013-01-01 DIAGNOSIS — I719 Aortic aneurysm of unspecified site, without rupture: Secondary | ICD-10-CM

## 2013-01-01 DIAGNOSIS — Z9889 Other specified postprocedural states: Secondary | ICD-10-CM

## 2013-01-01 DIAGNOSIS — I4891 Unspecified atrial fibrillation: Secondary | ICD-10-CM

## 2013-01-01 NOTE — Progress Notes (Signed)
PCP is Emeterio Reeve, MD Referring Provider is Donato Schultz, MD  Chief Complaint  Patient presents with  . Routine Post Op    3 week f/u with CXR   79 year old female status post repair of ascending aortic dissection  HPI: Patient returns for 3 month followup after repair of a subacute ascending aortic dissection and aneurysm. The repair was performed with a straight Heaney shield graft and resuspension of the valve using circulatory arrest. Since last visit the patient has improved significantly. Her nausea, insomnia, depression, are resolved. Her appetite is improved and she is walking. The incisions are healing well and she has mild peripheral edema.  The patient has chronic atrial fibrillation, rate controlled not on Coumadin do to bleeding risk from her previous dissection Past Medical History  Diagnosis Date  . Afib   . Aortic root aneurysm   . Hypertension     Past Surgical History  Procedure Laterality Date  . Bentall procedure N/A 11/11/2012    Procedure: BENTALL PROCEDURE;  Surgeon: Kerin Perna, MD;  Location: [facility] OR;  Service: Open Heart Surgery;  Laterality: N/A;  *[facility] ARREST*   . Intraoperative transesophageal echocardiogram N/A 11/11/2012    Procedure: INTRAOPERATIVE TRANSESOPHAGEAL ECHOCARDIOGRAM;  Surgeon: Kerin Perna, MD;  Location: [facility] OR;  Service: Open Heart Surgery;  Laterality: N/A;    No family history on file.  Social History History  Substance Use Topics  . Smoking status: Never Smoker   . Smokeless tobacco: Not on file  . Alcohol Use: No    Current Outpatient Prescriptions  Medication Sig Dispense Refill  . amiodarone (PACERONE) 400 MG tablet Take 100 mg by mouth daily.      Marland Kitchen aspirin EC 81 MG EC tablet Take 1 tablet (81 mg total) by mouth daily.      . magnesium hydroxide (MILK OF MAGNESIA) 400 MG/5ML suspension Take 30 mLs by mouth daily as needed for constipation.      . metoprolol tartrate (LOPRESSOR) 12.5 mg TABS Take 50 mg by mouth 2  (two) times daily.      . mirtazapine (REMERON) 15 MG tablet Take 15 mg by mouth at bedtime.      . senna (SENOKOT) 8.6 MG tablet Take 1 tablet by mouth 2 (two) times daily.      . traMADol (ULTRAM) 50 MG tablet Take 1 tablet (50 mg total) by mouth every 6 (six) hours as needed.  50 tablet  0   No current facility-administered medications for this visit.    No Known Allergies  Review of Systems improved appetite and strength Less depression BP 135/93  Pulse 104  Resp 20  Ht 5\' 1"  (1.549 m)  Wt 147 lb (66.679 kg)  BMI 27.79 kg/m2  SpO2 97% Physical Exam Alert and comfortable Lungs clear Heart rhythm irregular-atrial fibrillation, no murmur Incision is well-healed no focal motor deficit Mild pedal edema  Diagnostic Tests: Chest x-ray with clear lung fields no pleural effusion Stable uncoiling of descending thoracic aorta  Impression: Doing well 3 months postop repair of a subacute type a dissection. Continue current medications. Patient will return in 3 months for a CTA of the thoracic aorta  Plan: Return in 3 months with CTA of the thoracic aorta

## 2013-03-04 ENCOUNTER — Inpatient Hospital Stay (HOSPITAL_COMMUNITY)
Admission: EM | Admit: 2013-03-04 | Discharge: 2013-03-10 | DRG: 292 | Disposition: A | Discharge status: Home Health | Payer: Medicare PPO | Attending: Internal Medicine | Admitting: Internal Medicine

## 2013-03-04 ENCOUNTER — Encounter (HOSPITAL_COMMUNITY): Payer: Self-pay | Admitting: *Deleted

## 2013-03-04 ENCOUNTER — Emergency Department (HOSPITAL_COMMUNITY): Payer: Medicare PPO

## 2013-03-04 DIAGNOSIS — I5043 Acute on chronic combined systolic (congestive) and diastolic (congestive) heart failure: Principal | ICD-10-CM | POA: Diagnosis present

## 2013-03-04 DIAGNOSIS — I5021 Acute systolic (congestive) heart failure: Secondary | ICD-10-CM

## 2013-03-04 DIAGNOSIS — R609 Edema, unspecified: Secondary | ICD-10-CM

## 2013-03-04 DIAGNOSIS — I7101 Dissection of thoracic aorta: Secondary | ICD-10-CM

## 2013-03-04 DIAGNOSIS — I509 Heart failure, unspecified: Secondary | ICD-10-CM | POA: Diagnosis present

## 2013-03-04 DIAGNOSIS — Z7982 Long term (current) use of aspirin: Secondary | ICD-10-CM

## 2013-03-04 DIAGNOSIS — N179 Acute kidney failure, unspecified: Secondary | ICD-10-CM | POA: Diagnosis present

## 2013-03-04 DIAGNOSIS — I4891 Unspecified atrial fibrillation: Secondary | ICD-10-CM | POA: Diagnosis present

## 2013-03-04 DIAGNOSIS — I10 Essential (primary) hypertension: Secondary | ICD-10-CM | POA: Diagnosis present

## 2013-03-04 DIAGNOSIS — I719 Aortic aneurysm of unspecified site, without rupture: Secondary | ICD-10-CM

## 2013-03-04 DIAGNOSIS — Z79899 Other long term (current) drug therapy: Secondary | ICD-10-CM

## 2013-03-04 DIAGNOSIS — E876 Hypokalemia: Secondary | ICD-10-CM | POA: Diagnosis not present

## 2013-03-04 DIAGNOSIS — I82409 Acute embolism and thrombosis of unspecified deep veins of unspecified lower extremity: Secondary | ICD-10-CM | POA: Diagnosis present

## 2013-03-04 DIAGNOSIS — I428 Other cardiomyopathies: Secondary | ICD-10-CM | POA: Diagnosis present

## 2013-03-04 DIAGNOSIS — N289 Disorder of kidney and ureter, unspecified: Secondary | ICD-10-CM

## 2013-03-04 DIAGNOSIS — I7100 Dissection of unspecified site of aorta: Secondary | ICD-10-CM

## 2013-03-04 LAB — CBC WITH DIFFERENTIAL/PLATELET
Basophils Absolute: 0 10*3/uL (ref 0.0–0.1)
Basophils Absolute: 0 10*3/uL (ref 0.0–0.1)
Basophils Relative: 0 % (ref 0–1)
Basophils Relative: 1 % (ref 0–1)
Eosinophils Absolute: 0.1 10*3/uL (ref 0.0–0.7)
Eosinophils Absolute: 0.1 10*3/uL (ref 0.0–0.7)
Eosinophils Relative: 1 % (ref 0–5)
Eosinophils Relative: 1 % (ref 0–5)
HCT: 38.6 % (ref 36.0–46.0)
HCT: 39.9 % (ref 36.0–46.0)
Hemoglobin: 12.8 g/dL (ref 12.0–15.0)
Hemoglobin: 12.9 g/dL (ref 12.0–15.0)
Lymphocytes Relative: 19 % (ref 12–46)
Lymphocytes Relative: 27 % (ref 12–46)
Lymphs Abs: 1.3 10*3/uL (ref 0.7–4.0)
Lymphs Abs: 1.9 10*3/uL (ref 0.7–4.0)
MCH: 29.3 pg (ref 26.0–34.0)
MCH: 28.7 pg (ref 26.0–34.0)
MCHC: 33.2 g/dL (ref 30.0–36.0)
MCHC: 32.3 g/dL (ref 30.0–36.0)
MCV: 88.3 fL (ref 78.0–100.0)
MCV: 88.7 fL (ref 78.0–100.0)
Monocytes Absolute: 0.8 10*3/uL (ref 0.1–1.0)
Monocytes Absolute: 0.9 10*3/uL (ref 0.1–1.0)
Monocytes Relative: 11 % (ref 3–12)
Monocytes Relative: 12 % (ref 3–12)
Neutro Abs: 5 10*3/uL (ref 1.7–7.7)
Neutro Abs: 4.1 10*3/uL (ref 1.7–7.7)
Neutrophils Relative %: 69 % (ref 43–77)
Neutrophils Relative %: 59 % (ref 43–77)
Platelets: 253 10*3/uL (ref 150–400)
Platelets: 239 10*3/uL (ref 150–400)
RBC: 4.37 MIL/uL (ref 3.87–5.11)
RBC: 4.5 MIL/uL (ref 3.87–5.11)
RDW: 15.9 % — ABNORMAL HIGH (ref 11.5–15.5)
RDW: 16 % — ABNORMAL HIGH (ref 11.5–15.5)
WBC: 7.2 10*3/uL (ref 4.0–10.5)
WBC: 7 10*3/uL (ref 4.0–10.5)

## 2013-03-04 LAB — COMPREHENSIVE METABOLIC PANEL
ALT: 40 U/L — ABNORMAL HIGH (ref 0–35)
AST: 34 U/L (ref 0–37)
Albumin: 3.1 g/dL — ABNORMAL LOW (ref 3.5–5.2)
Alkaline Phosphatase: 113 U/L (ref 39–117)
BUN: 32 mg/dL — ABNORMAL HIGH (ref 6–23)
CO2: 26 mEq/L (ref 19–32)
Calcium: 8.9 mg/dL (ref 8.4–10.5)
Chloride: 105 mEq/L (ref 96–112)
Creatinine, Ser: 1.23 mg/dL — ABNORMAL HIGH (ref 0.50–1.10)
GFR calc Af Amer: 46 mL/min — ABNORMAL LOW (ref >90)
GFR calc non Af Amer: 39 mL/min — ABNORMAL LOW (ref >90)
Glucose, Bld: 122 mg/dL — ABNORMAL HIGH (ref 70–99)
Potassium: 3.7 mEq/L (ref 3.5–5.1)
Sodium: 143 mEq/L (ref 135–145)
Total Bilirubin: 0.6 mg/dL (ref 0.3–1.2)
Total Protein: 6.4 g/dL (ref 6.0–8.3)

## 2013-03-04 LAB — URINALYSIS, ROUTINE W REFLEX MICROSCOPIC
Bilirubin Urine: NEGATIVE
Glucose, UA: NEGATIVE mg/dL
Hgb urine dipstick: NEGATIVE
Ketones, ur: NEGATIVE mg/dL
Leukocytes, UA: NEGATIVE
Nitrite: NEGATIVE
Protein, ur: NEGATIVE mg/dL
Specific Gravity, Urine: 1.011 (ref 1.005–1.030)
Urobilinogen, UA: 0.2 mg/dL (ref 0.0–1.0)
pH: 5.5 (ref 5.0–8.0)

## 2013-03-04 LAB — TROPONIN I
Troponin I: <0.3 ng/mL
Troponin I: <0.3 ng/mL (ref <0.30)
Troponin I: <0.3 ng/mL (ref <0.30)
Troponin I: <0.3 ng/mL (ref <0.30)

## 2013-03-04 LAB — BASIC METABOLIC PANEL
BUN: 34 mg/dL — ABNORMAL HIGH (ref 6–23)
CO2: 25 mEq/L (ref 19–32)
Calcium: 8.6 mg/dL (ref 8.4–10.5)
Chloride: 107 mEq/L (ref 96–112)
Creatinine, Ser: 1.2 mg/dL — ABNORMAL HIGH (ref 0.50–1.10)
GFR calc Af Amer: 47 mL/min — ABNORMAL LOW (ref >90)
GFR calc non Af Amer: 41 mL/min — ABNORMAL LOW (ref >90)
Glucose, Bld: 157 mg/dL — ABNORMAL HIGH (ref 70–99)
Potassium: 3.9 mEq/L (ref 3.5–5.1)
Sodium: 141 mEq/L (ref 135–145)

## 2013-03-04 LAB — PRO B NATRIURETIC PEPTIDE: Pro B Natriuretic peptide (BNP): 6122 pg/mL — ABNORMAL HIGH (ref 0–450)

## 2013-03-04 MED ORDER — WARFARIN SODIUM 4 MG PO TABS
4.0000 mg | ORAL_TABLET | Freq: Once | ORAL | Status: AC
  Administered 2013-03-04: 4 mg via ORAL
  Filled 2013-03-04: qty 1

## 2013-03-04 MED ORDER — DILTIAZEM HCL ER 120 MG PO CP24
120.0000 mg | ORAL_CAPSULE | Freq: Every day | ORAL | Status: DC
  Administered 2013-03-04: 120 mg via ORAL
  Filled 2013-03-04 (×2): qty 1

## 2013-03-04 MED ORDER — SODIUM CHLORIDE 0.9 % IJ SOLN
3.0000 mL | Freq: Two times a day (BID) | INTRAMUSCULAR | Status: DC
  Administered 2013-03-04 – 2013-03-08 (×4): 3 mL via INTRAVENOUS

## 2013-03-04 MED ORDER — METOPROLOL TARTRATE 12.5 MG HALF TABLET
12.5000 mg | ORAL_TABLET | Freq: Two times a day (BID) | ORAL | Status: DC
  Administered 2013-03-04 – 2013-03-05 (×2): 12.5 mg via ORAL
  Filled 2013-03-04 (×3): qty 1

## 2013-03-04 MED ORDER — FUROSEMIDE 10 MG/ML IJ SOLN
40.0000 mg | Freq: Two times a day (BID) | INTRAMUSCULAR | Status: DC
  Administered 2013-03-04 – 2013-03-06 (×4): 40 mg via INTRAVENOUS
  Filled 2013-03-04 (×8): qty 4

## 2013-03-04 MED ORDER — FUROSEMIDE 10 MG/ML IJ SOLN
40.0000 mg | Freq: Once | INTRAMUSCULAR | Status: AC
  Administered 2013-03-04: 40 mg via INTRAVENOUS
  Filled 2013-03-04: qty 4

## 2013-03-04 MED ORDER — AMIODARONE HCL 200 MG PO TABS
200.0000 mg | ORAL_TABLET | Freq: Every day | ORAL | Status: DC
Start: 2013-03-04 — End: 2013-03-10
  Administered 2013-03-04 – 2013-03-10 (×7): 200 mg via ORAL
  Filled 2013-03-04 (×7): qty 1

## 2013-03-04 MED ORDER — MIRTAZAPINE 15 MG PO TABS
15.0000 mg | ORAL_TABLET | Freq: Every day | ORAL | Status: DC
  Administered 2013-03-04 – 2013-03-09 (×6): 15 mg via ORAL
  Filled 2013-03-04 (×7): qty 1

## 2013-03-04 MED ORDER — ACETAMINOPHEN 650 MG RE SUPP: 650.0000 mg | Freq: Four times a day (QID) | RECTAL | Status: DC | PRN

## 2013-03-04 MED ORDER — ACETAMINOPHEN 325 MG PO TABS: 650.0000 mg | ORAL_TABLET | Freq: Four times a day (QID) | ORAL | Status: DC | PRN

## 2013-03-04 MED ORDER — METOPROLOL TARTRATE 100 MG PO TABS
100.0000 mg | ORAL_TABLET | Freq: Two times a day (BID) | ORAL | Status: DC
  Administered 2013-03-04: 100 mg via ORAL
  Filled 2013-03-04 (×2): qty 1

## 2013-03-04 MED ORDER — ENOXAPARIN SODIUM 40 MG/0.4ML ~~LOC~~ SOLN
40.0000 mg | SUBCUTANEOUS | Status: DC
  Administered 2013-03-04: 40 mg via SUBCUTANEOUS
  Filled 2013-03-04: qty 0.4

## 2013-03-04 MED ORDER — WARFARIN VIDEO: Freq: Once | Status: DC

## 2013-03-04 MED ORDER — ASPIRIN 81 MG PO CHEW
324.0000 mg | CHEWABLE_TABLET | Freq: Once | ORAL | Status: AC
  Administered 2013-03-04: 324 mg via ORAL
  Filled 2013-03-04: qty 4

## 2013-03-04 MED ORDER — DOCUSATE SODIUM 100 MG PO CAPS
100.0000 mg | ORAL_CAPSULE | Freq: Every day | ORAL | Status: DC
  Administered 2013-03-04 – 2013-03-10 (×6): 100 mg via ORAL
  Filled 2013-03-04 (×8): qty 1

## 2013-03-04 MED ORDER — WARFARIN - PHARMACIST DOSING INPATIENT: Freq: Every day | Status: DC

## 2013-03-04 MED ORDER — SODIUM CHLORIDE 0.9 % IJ SOLN
3.0000 mL | Freq: Two times a day (BID) | INTRAMUSCULAR | Status: DC
  Administered 2013-03-07: 22:00:00 via INTRAVENOUS
  Administered 2013-03-08: 3 mL via INTRAVENOUS

## 2013-03-04 MED ORDER — COUMADIN BOOK
Freq: Once | Status: AC
  Administered 2013-03-04: 21:00:00
  Filled 2013-03-04: qty 1

## 2013-03-04 MED ORDER — ONDANSETRON HCL 4 MG/2ML IJ SOLN: 4.0000 mg | Freq: Four times a day (QID) | INTRAMUSCULAR | Status: DC | PRN

## 2013-03-04 MED ORDER — HEPARIN (PORCINE) IN NACL 100-0.45 UNIT/ML-% IJ SOLN
1200.0000 [IU]/h | INTRAMUSCULAR | Status: DC
  Administered 2013-03-04 – 2013-03-06 (×3): 1000 [IU]/h via INTRAVENOUS
  Administered 2013-03-09: 1200 [IU]/h via INTRAVENOUS
  Filled 2013-03-04 (×11): qty 250

## 2013-03-04 MED ORDER — ASPIRIN EC 325 MG PO TBEC
325.0000 mg | DELAYED_RELEASE_TABLET | Freq: Every day | ORAL | Status: DC
  Administered 2013-03-04 – 2013-03-10 (×7): 325 mg via ORAL
  Filled 2013-03-04 (×7): qty 1

## 2013-03-04 MED ORDER — ONDANSETRON HCL 4 MG PO TABS: 4.0000 mg | ORAL_TABLET | Freq: Four times a day (QID) | ORAL | Status: DC | PRN

## 2013-03-04 NOTE — Progress Notes (Addendum)
ANTICOAGULATION CONSULT NOTE - Initial Consult  Pharmacy Consult for Heparin / Coumadin  Indication:  LLE DVT/ Afib  No Known Allergies  Patient Measurements: Height: 5\' 3"  (160 cm) Weight: 142 lb 6.7 oz (64.6 kg) IBW/kg (Calculated) : 52.4 Heparin Dosing Weight: 64.6 kg  Vital Signs:    Labs:  Recent Labs  03/04/13 0109 03/04/13 0735 03/04/13 1250 03/04/13 1626  HGB 12.8 12.9  --   --   HCT 38.6 39.9  --   --   PLT 253 239  --   --   CREATININE 1.20* 1.23*  --   --   TROPONINI <0.30 <0.30 <0.30 <0.30    Estimated Creatinine Clearance: 31.3 ml/min (by C-G formula based on Cr of 1.23).   Medical History: Past Medical History  Diagnosis Date  . Afib   . Aortic root aneurysm   . Hypertension   . CHF (congestive heart failure)   . Shortness of breath     Medications:  Prescriptions prior to admission  Medication Sig Dispense Refill  . acetaminophen (TYLENOL) 500 MG tablet Take 500 mg by mouth every 6 (six) hours as needed for pain.      Marland Kitchen amiodarone (PACERONE) 200 MG tablet Take 200 mg by mouth daily.      Marland Kitchen aspirin 81 MG chewable tablet Chew 81 mg by mouth daily.      Marland Kitchen diltiazem (DILACOR XR) 120 MG 24 hr capsule Take 120 mg by mouth daily.      . metoprolol (LOPRESSOR) 100 MG tablet Take 100 mg by mouth 2 (two) times daily.      . mirtazapine (REMERON) 15 MG tablet Take 15 mg by mouth at bedtime.       Scheduled:  . amiodarone  200 mg Oral Daily  . aspirin EC  325 mg Oral Daily  . diltiazem  120 mg Oral Daily  . docusate sodium  100 mg Oral Daily  . furosemide  40 mg Intravenous Q12H  . metoprolol  12.5 mg Oral BID  . mirtazapine  15 mg Oral QHS  . sodium chloride  3 mL Intravenous Q12H  . sodium chloride  3 mL Intravenous Q12H    Assessment: 79 y.o female with chronic afib who had a doppler study done today that is positive for LLE DVT.  The patient was not on coumadin prior to this admission but she was on coumadin in the past but it  was held due to  intramural hematoma in the aorta. S/p repair of subacute type A aortic dissection in March 2014.    CBC within normal range. Low Albumin 3.1,  LFTs within normal except ALT slightly high at 40. Scr 1.23,  CrCl ~ 31 ml/min  On Amiodarone-which will increase effect of coumadin.  Lovenox 40mg  SQ just given tonight at 18:45.  Will discontinue the lovenox.   Goal of Therapy:  INR 2-3 Heparin level 0.3-0.7 units/ml Monitor platelets by anticoagulation protocol: Yes   Plan:  Discontinue SQ lovenox Coumadin 4 mg po x1 tonight Start IV Heparin infusion at 1000 units/hr  (no bolus due to SQ Lovenox 40mg  given tonight). Check heparin level in ~8 hours then daily heparin level, CBC and INR.    Noah Delaine, RPh Clinical Pharmacist Pager: [phone] 03/04/2013,6:56 PM

## 2013-03-04 NOTE — ED Notes (Signed)
Pt speaks English as a second language. Pt speaks german predominantly

## 2013-03-04 NOTE — Progress Notes (Signed)
Doppler LE showed left soleal DVT. Will start coumadin bridged with IV heparin. Myocardial perfusion scan ordered by thoracic sx.

## 2013-03-04 NOTE — Consult Note (Signed)
Admit date: 03/04/2013 Referring Physician  Dr. Gonzella Lex Primary Physician Emeterio Reeve, MD Primary Cardiologist  Dr. Anne Fu Reason for Consultation  Acute diastolic heart failure  HPI: 79 year old with recent repair of type A aortic dissection 3/14 with intramural hematoma, persistent atrial fibrillation now adequately rate controlled who presented with increasing shortness of breath and lower extremity edema. I had recently been seeing her in the office setting for similar complaints however her atrial fibrillation at that time was not controlled. I have slowly been uptitrating/reinitiating her medications including metoprolol 100 mg twice a day and diltiazem 120 mg once a day. Admittedly, she was not taking the medications as prescribed previously. She was supposed to see me in clinic today however yesterday evening came into the emergency department with worsening shortness of breath. This morning, she does feel better after initiation of IV Lasix 40 mg. She was sleepy when I talk to her and not very talkative. She is not complaining any chest pain currently. She may have had some chest discomfort previously. Somewhat difficult to communicate with due to to language barrier.      PMH:   Past Medical History  Diagnosis Date  . Afib   . Aortic root aneurysm   . Hypertension     PSH:   Past Surgical History  Procedure Laterality Date  . Bentall procedure N/A 11/11/2012    Procedure: BENTALL PROCEDURE;  Surgeon: Kerin Perna, MD;  Location: [facility] OR;  Service: Open Heart Surgery;  Laterality: N/A;  *[facility] ARREST*   . Intraoperative transesophageal echocardiogram N/A 11/11/2012    Procedure: INTRAOPERATIVE TRANSESOPHAGEAL ECHOCARDIOGRAM;  Surgeon: Kerin Perna, MD;  Location: [facility] OR;  Service: Open Heart Surgery;  Laterality: N/A;   Allergies:  Review of patient's allergies indicates no known allergies. Prior to Admit Meds:   Prescriptions prior to admission  Medication Sig Dispense  Refill  . acetaminophen (TYLENOL) 500 MG tablet Take 500 mg by mouth every 6 (six) hours as needed for pain.      Marland Kitchen amiodarone (PACERONE) 200 MG tablet Take 200 mg by mouth daily.      Marland Kitchen aspirin 81 MG chewable tablet Chew 81 mg by mouth daily.      Marland Kitchen diltiazem (DILACOR XR) 120 MG 24 hr capsule Take 120 mg by mouth daily.      . metoprolol (LOPRESSOR) 100 MG tablet Take 100 mg by mouth 2 (two) times daily.      . mirtazapine (REMERON) 15 MG tablet Take 15 mg by mouth at bedtime.       Fam HX:   History reviewed. No pertinent family history. Social HX:    History   Social History  . Marital Status: Unknown    Spouse Name: N/A    Number of Children: 2  . Years of Education: N/A   Occupational History  . Not on file.   Social History Main Topics  . Smoking status: Never Smoker   . Smokeless tobacco: Not on file  . Alcohol Use: No  . Drug Use: No  . Sexually Active: No   Other Topics Concern  . Not on file   Social History Narrative   From [place], [place]     ROS: Denies any strokelike symptoms, fevers, cough, orthopnea, bleeding, syncope All 11 ROS were addressed and are negative except what is stated in the HPI  Physical Exam: Blood pressure 124/88, pulse 90, temperature 98 F (36.7 C), temperature source Oral, resp. rate 21, height 5\' 3"  (1.6 m),  weight 64.6 kg (142 lb 6.7 oz), SpO2 91.00%.    General: Well developed, well nourished, in no acute distress Head: Eyes PERRLA, No xanthomas.   Normal cephalic and atramatic  Lungs:   Clear bilaterally to auscultation and percussion. Normal respiratory effort. No wheezes, no rales. Heart:   HRRR S1 S2 Pulses are 2+ & equal.1/6 systolic murmur right upper sternal border            No carotid bruit. No JVD.  No abdominal bruits. No femoral bruits. Abdomen: Bowel sounds are positive, abdomen soft and non-tender without masses. No hepatosplenomegaly. Msk:  Back normal. Normal strength and tone for age. Extremities:   No clubbing,  cyanosis, 1-2+ LE edema.  DP +1 Neuro: Alert and oriented X 3, non-focal, MAE x 4 GU: Deferred Rectal: Deferred Psych:  Good affect, responds appropriately, somewhat sleepy    Labs:   Lab Results  Component Value Date   WBC 7.0 03/04/2013   HGB 12.9 03/04/2013   HCT 39.9 03/04/2013   MCV 88.7 03/04/2013   PLT 239 03/04/2013    Recent Labs Lab 03/04/13 0735  NA 143  K 3.7  CL 105  CO2 26  BUN 32*  CREATININE 1.23*  CALCIUM 8.9  PROT 6.4  BILITOT 0.6  ALKPHOS 113  ALT 40*  AST 34  GLUCOSE 122*   No results found for this basename: PTT   Lab Results  Component Value Date   INR 1.64* 11/11/2012   INR 1.25 11/11/2012   INR 1.32 11/10/2012   Lab Results  Component Value Date   TROPONINI <0.30 03/04/2013        Radiology:  Dg Chest 2 View  03/04/2013   *RADIOLOGY REPORT*  Clinical Data: Shortness of breath, dizziness, chest pain.  CHEST - 2 VIEW  Comparison: 01/01/2013  Findings: Stable appearance of postoperative change in the mediastinum.  Shallow inspiration.  Cardiac enlargement with dilated and tortuous thoracic aorta.  Pulmonary vascularity is mildly increased.  No significant edema.  Atelectasis in the lung bases is new since previous study.  Small bilateral pleural effusions.  No pneumothorax.  Probable esophageal hiatal hernia behind the heart.  IMPRESSION: Cardiac enlargement with thoracic aortic dilatation.  Increasing pulmonary vascular congestion with small bilateral pleural effusions and basilar atelectasis new since previous study. Probable esophageal hiatal hernia behind the heart.   Original Report Authenticated By: Burman Nieves, M.D.   Personally viewed.  EKG: atrial fibrillation, normal rate, telemetry demonstrates similar findings  Personally viewed.   ASSESSMENT/PLAN:   79 year old with persistent atrial fibrillation, recent aortic type a dissection repair in March of 2014, here with acute diastolic heart failure.  1. Acute diastolic heart failure-in  March of 2014 her ejection fraction was low normal at 50-55%. Repeat echocardiogram. I agree with current IV Lasix administration 40 mg IV every 12 hours. I would like to see a net negative of 1-2 L per 24 hours. She currently is laying flat, appears quite comfortable.  2. Atrial fibrillation-persistent. Because of her previous intramural hematoma in her aorta, she is not currently on anticoagulation. We have discussed this in clinic about her stroke risk. She is on aspirin. Continue with current rate controlling medications, most recent reinitiation was diltiazem last week in clinic.  3. Aortic dissection repair-currently stable.  4. Hypertension-currently controlled on current medications.  We'll follow along.  Donato Schultz, MD  03/04/2013  9:09 AM

## 2013-03-04 NOTE — ED Notes (Addendum)
Pt has open heart surgery 4 months ago for they anyersm on her aorta. Pt states SOB, dizziness and CP that have started today. Pt was SOB and dizzy yesterday but she was wheezing and has a feeling of pressure in her center chest today. Pt also concerned about swelling to bilateral legs for the past couple of days.

## 2013-03-04 NOTE — ED Notes (Signed)
2-3+ pitting edema BLE. BLE pulses are very faint

## 2013-03-04 NOTE — Progress Notes (Signed)
*  PRELIMINARY RESULTS* Vascular Ultrasound Lower extremity venous duplex has been completed.  Preliminary findings:  Right = negative for DVT. Left = Appears to be DVT in a deep vein of the calf. Most likely the soleal veins.  Farrel Demark, RDMS, RVT  03/04/2013, 2:41 PM

## 2013-03-04 NOTE — ED Provider Notes (Signed)
History    CSN: [id number] Arrival date & time 03/04/13  0005  First MD Initiated Contact with Patient 03/04/13 [id number]     Chief Complaint  Patient presents with  . Chest Pain  . Shortness of Breath   (Consider location/radiation/quality/duration/timing/severity/associated sxs/prior Treatment) Patient is a 79 y.o. female presenting with chest pain and shortness of breath. The history is provided by the patient and a relative.  Chest Pain Associated symptoms: shortness of breath   Shortness of Breath Associated symptoms: chest pain   She has been having progressive dyspnea over the last 3 days. Dyspnea is worse when supine and worse with any exertion. There's been associated tightness in her chest and nausea. There's been no diaphoresis. She is feeling generally weak. Nothing makes her symptoms better. She is also having swelling in her feet which is new. Of note, she was started on metoprolol one week ago. Past Medical History  Diagnosis Date  . Afib   . Aortic root aneurysm   . Hypertension    Past Surgical History  Procedure Laterality Date  . Bentall procedure N/A 11/11/2012    Procedure: BENTALL PROCEDURE;  Surgeon: Kerin Perna, MD;  Location: [facility] OR;  Service: Open Heart Surgery;  Laterality: N/A;  *CIRC ARREST*   . Intraoperative transesophageal echocardiogram N/A 11/11/2012    Procedure: INTRAOPERATIVE TRANSESOPHAGEAL ECHOCARDIOGRAM;  Surgeon: Kerin Perna, MD;  Location: [facility] OR;  Service: Open Heart Surgery;  Laterality: N/A;   History reviewed. No pertinent family history. History  Substance Use Topics  . Smoking status: Never Smoker   . Smokeless tobacco: Not on file  . Alcohol Use: No   OB History   Grav Para Term Preterm Abortions TAB SAB Ect Mult Living                 Review of Systems  Respiratory: Positive for shortness of breath.   Cardiovascular: Positive for chest pain.  All other systems reviewed and are negative.    Allergies  Review of  patient's allergies indicates no known allergies.  Home Medications   Current Outpatient Rx  Name  Route  Sig  Dispense  Refill  . amiodarone (PACERONE) 400 MG tablet   Oral   Take 100 mg by mouth daily.         Marland Kitchen aspirin EC 81 MG EC tablet   Oral   Take 1 tablet (81 mg total) by mouth daily.         . magnesium hydroxide (MILK OF MAGNESIA) 400 MG/5ML suspension   Oral   Take 30 mLs by mouth daily as needed for constipation.         . metoprolol tartrate (LOPRESSOR) 12.5 mg TABS   Oral   Take 50 mg by mouth 2 (two) times daily.         . mirtazapine (REMERON) 15 MG tablet   Oral   Take 15 mg by mouth at bedtime.         . senna (SENOKOT) 8.6 MG tablet   Oral   Take 1 tablet by mouth 2 (two) times daily.         . traMADol (ULTRAM) 50 MG tablet   Oral   Take 1 tablet (50 mg total) by mouth every 6 (six) hours as needed.   50 tablet   0    BP 122/82  Pulse 90  Temp(Src) 98.4 F (36.9 C) (Oral)  Resp 20  Ht 5\' 3"  (1.6 m)  Wt  142 lb 4 oz (64.524 kg)  BMI 25.2 kg/m2  SpO2 96% Physical Exam  Nursing note and vitals reviewed.  78 year old female, who appears to be mildly tachypnea at rest, but is in no acute distress. Vital signs are normal. Oxygen saturation is 96%, which is normal. Head is normocephalic and atraumatic. PERRLA, EOMI. Oropharynx is clear. Neck is nontender and supple without adenopathy or JVD. Back is nontender and there is no CVA tenderness. Lungs have decreased breath sounds at the bases with bibasilar rales which are more prominent on the left. There are no wheezes or rhonchi. Chest is nontender. Heart is irregularly irregular without murmur. Abdomen is soft, flat, nontender without masses or hepatosplenomegaly and peristalsis is normoactive. Extremities have 2+ edema and there is 1+ presacral edema, full range of motion is present. Skin is warm and dry without rash. Neurologic: Mental status is normal, cranial nerves are intact,  there are no motor or sensory deficits.  ED Course  Procedures (including critical care time) Results for orders placed during the hospital encounter of 03/04/13  CBC WITH DIFFERENTIAL      Result Value Range   WBC 7.2  4.0 - 10.5 K/uL   RBC 4.37  3.87 - 5.11 MIL/uL   Hemoglobin 12.8  12.0 - 15.0 g/dL   HCT 19.1  47.8 - 29.5 %   MCV 88.3  78.0 - 100.0 fL   MCH 29.3  26.0 - 34.0 pg   MCHC 33.2  30.0 - 36.0 g/dL   RDW 62.1 (*) 30.8 - 65.7 %   Platelets 253  150 - 400 K/uL   Neutrophils Relative % 69  43 - 77 %   Neutro Abs 5.0  1.7 - 7.7 K/uL   Lymphocytes Relative 19  12 - 46 %   Lymphs Abs 1.3  0.7 - 4.0 K/uL   Monocytes Relative 11  3 - 12 %   Monocytes Absolute 0.8  0.1 - 1.0 K/uL   Eosinophils Relative 1  0 - 5 %   Eosinophils Absolute 0.1  0.0 - 0.7 K/uL   Basophils Relative 0  0 - 1 %   Basophils Absolute 0.0  0.0 - 0.1 K/uL  BASIC METABOLIC PANEL      Result Value Range   Sodium 141  135 - 145 mEq/L   Potassium 3.9  3.5 - 5.1 mEq/L   Chloride 107  96 - 112 mEq/L   CO2 25  19 - 32 mEq/L   Glucose, Bld 157 (*) 70 - 99 mg/dL   BUN 34 (*) 6 - 23 mg/dL   Creatinine, Ser 8.46 (*) 0.50 - 1.10 mg/dL   Calcium 8.6  8.4 - 96.2 mg/dL   GFR calc non Af Amer 41 (*) >90 mL/min   GFR calc Af Amer 47 (*) >90 mL/min  PRO B NATRIURETIC PEPTIDE      Result Value Range   Pro B Natriuretic peptide (BNP) 6122.0 (*) 0 - 450 pg/mL  TROPONIN I      Result Value Range   Troponin I <0.30  <0.30 ng/mL   Dg Chest 2 View  03/04/2013   *RADIOLOGY REPORT*  Clinical Data: Shortness of breath, dizziness, chest pain.  CHEST - 2 VIEW  Comparison: 01/01/2013  Findings: Stable appearance of postoperative change in the mediastinum.  Shallow inspiration.  Cardiac enlargement with dilated and tortuous thoracic aorta.  Pulmonary vascularity is mildly increased.  No significant edema.  Atelectasis in the lung bases is new since  previous study.  Small bilateral pleural effusions.  No pneumothorax.  Probable  esophageal hiatal hernia behind the heart.  IMPRESSION: Cardiac enlargement with thoracic aortic dilatation.  Increasing pulmonary vascular congestion with small bilateral pleural effusions and basilar atelectasis new since previous study. Probable esophageal hiatal hernia behind the heart.   Original Report Authenticated By: Burman Nieves, M.D.    Images viewed by me.   Date: 03/04/2013  Rate: 89  Rhythm: atrial fibrillation and premature ventricular contractions (PVC)  QRS Axis: left  Intervals: normal  ST/T Wave abnormalities: nonspecific T wave changes  Conduction Disutrbances:left anterior fascicular block  Narrative Interpretation: Atrial fibrillation with occasional PVC, left anterior fascicular block, old inferior wall and old anteroseptal wall myocardial infarctions, low voltage, nonspecific T wave changes. Compared with ECG of 11/12/2012, no significant changes are seen.  Old EKG Reviewed: unchanged   1. CHF, acute   2. Renal insufficiency   3. Atrial fibrillation     MDM  CHF which is apparently new. Old records are reviewed and she had repair of an aneurysm of the ascending aorta. Office visit 2 months ago did state a small amount of pedal edema but it seems that the current amount of edema is significantly more. With new onset CHF, she will likely need to be admitted. Exam is worrisome for pleural effusions. Chest x-ray is pending.  Workup shows elevated BNP consistent with CHF. Chest x-ray is also consistent with CHF. Renal insufficiency is new but also consistent with CHF. She's given a dose of furosemide in the emergency department. Case is discussed with Dr. Toniann Fail of [facility] who agrees to admit the patient.    Dione Booze, MD 03/04/13 [phone]

## 2013-03-04 NOTE — Progress Notes (Addendum)
  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 Intraoperative echo showed EF approximately 45 percent. Coronary arteriogram was not performed prior to surgery. Patient is done well clinically following surgery until her beta blocker was increased to 100 mg twice a day. She's discharged following surgery at 12.5 twice a day for chronic atrial fibrillation.  She returns now with increased cardiac size, mild pedal edema, orthopnea. Atrial fibrillation has been rate controlled. She has not been on Coumadin because of her recent thoracic aneurysm repair.  2-D echocardiogram performed today is reviewed. This shows decreased global LV of 25-30%, mild AI, mild MR, no significant pericardial effusion  Objective: Vital signs in last 24 hours: Temp:  [98 F (36.7 C)-98.4 F (36.9 C)] 98 F (36.7 C) (07/22 0558) Pulse Rate:  [80-104] 90 (07/22 0558) Cardiac Rhythm:  [-] Atrial fibrillation (07/22 0430) Resp:  [16-34] 21 (07/22 0558) BP: (107-124)/(71-88) 124/88 mmHg (07/22 0558) SpO2:  [91 %-96 %] 91 % (07/22 0558) Weight:  [142 lb 4 oz (64.524 kg)-142 lb 6.7 oz (64.6 kg)] 142 lb 6.7 oz (64.6 kg) (07/22 0558)  Hemodynamic parameters for last 24 hours:    Intake/Output from previous day: 07/21 0701 - 07/22 0700 In: -  Out: 1000 [Urine:1000] Intake/Output this shift: Total I/O In: -  Out: 200 [Urine:200]  Atrial fibrillation Mild peripheral edema Lungs clear No murmur or gallop  Lab Results:  Recent Labs  03/04/13 0109 03/04/13 0735  WBC 7.2 7.0  HGB 12.8 12.9  HCT 38.6 39.9  PLT 253 239   BMET:  Recent Labs  03/04/13 0109 03/04/13 0735  NA 141 143  K 3.9 3.7  CL 107 105  CO2 25 26  GLUCOSE 157* 122*  BUN 34* 32*  CREATININE 1.20* 1.23*  CALCIUM 8.6 8.9    PT/INR: No results found for this basename: LABPROT, INR,  in the last 72  hours ABG    Component Value Date/Time   PHART 7.434 11/13/2012 0554   HCO3 26.4* 11/13/2012 0554   TCO2 28 11/13/2012 0554   ACIDBASEDEF 2.0 11/12/2012 0743   O2SAT 94.0 11/13/2012 0554   CBG (last 3)  No results found for this basename: GLUCAP,  in the last 72 hours  Assessment/Plan: S/P   Decrease in LV EF following thoracic aneurysm resection Would decrease beta blocker back to 12.5 twice a day Would recommend myocardial perfusion scan with EF since patient did not have coronary arteriograms prior to surgery . Consider starting Coumadin at this time for atrial fibrillation. Since her creatinine is mildly elevated her BUN is higher we'll wait on getting a CTA of the thoracic aorta. Will follow   LOS: 0 days    VAN TRIGT III,Ingvald Theisen 03/04/2013

## 2013-03-04 NOTE — Progress Notes (Addendum)
[facility] PROGRESS NOTE  [name] WUJ:[id number] DOB: 08-10-1928 DOA: 03/04/2013 PCP: Emeterio Reeve, MD   Patient admitted early this am for acute CHF exacerbation.    Assessment/Plan: CHF exacerbation Monitor on tele. IV lasix q 12hr. Repeat 2D echo. Last echo from march with normal EF.  discussed plan  with Dr Anne Fu and appreciate recommendations Monitor strict I/O and daily weights   Afib  rate controlled. Continue metorpolol. Cardizem recently started by Dr Anne Fu. continue amiodarone. 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 Continue ASA.    Recent ascending aortic dissection repair Sx in march. Follows with Dr Zenaida Niece tright as outpt   AKI  mild. Could worsen further on IV lasix. Will monitor    Code Status: full Family Communication: none Disposition Plan: home once stable   Consultants:  Dr Anne Fu [facility] cards)  Procedures:  none  Antibiotics:  none  HPI/Subjective: Admission H&P reviewed  Objective: Filed Vitals:   03/04/13 0100 03/04/13 0115 03/04/13 0207 03/04/13 0558  BP: 117/81 107/72 118/71 124/88  Pulse: 80 84 80 90  Temp:    98 F (36.7 C)  TempSrc:    Oral  Resp: 28 34 23 21  Height:      Weight:    64.6 kg (142 lb 6.7 oz)  SpO2: 93% 94% 93% 91%    Intake/Output Summary (Last 24 hours) at 03/04/13 1111 Last data filed at 03/04/13 0729  Gross per 24 hour  Intake      0 ml  Output   1200 ml  Net  -1200 ml   Filed Weights   03/04/13 0014 03/04/13 0558  Weight: 64.524 kg (142 lb 4 oz) 64.6 kg (142 lb 6.7 oz)    Exam:   General:  Elderly female appears fatigued  HEENT: no pallor, no JVD  Chest: bibasilar crackles present   CVS: S1&S2 irregular  Abd: soft, NT, ND, BS+  Ext: 1 + edema B/L  CNS: AAOX3, poor historian    Data Reviewed: Basic Metabolic Panel:  Recent Labs Lab 03/04/13 0109 03/04/13 0735  NA 141 143  K 3.9 3.7  CL 107 105  CO2 25 26  GLUCOSE  157* 122*  BUN 34* 32*  CREATININE 1.20* 1.23*  CALCIUM 8.6 8.9   Liver Function Tests:  Recent Labs Lab 03/04/13 0735  AST 34  ALT 40*  ALKPHOS 113  BILITOT 0.6  PROT 6.4  ALBUMIN 3.1*   No results found for this basename: LIPASE, AMYLASE,  in the last 168 hours No results found for this basename: AMMONIA,  in the last 168 hours CBC:  Recent Labs Lab 03/04/13 0109 03/04/13 0735  WBC 7.2 7.0  NEUTROABS 5.0 4.1  HGB 12.8 12.9  HCT 38.6 39.9  MCV 88.3 88.7  PLT 253 239   Cardiac Enzymes:  Recent Labs Lab 03/04/13 0109 03/04/13 0735  TROPONINI <0.30 <0.30   BNP (last 3 results)  Recent Labs  03/04/13 0109  PROBNP 6122.0*   CBG: No results found for this basename: GLUCAP,  in the last 168 hours  No results found for this or any previous visit (from the past 240 hour(s)).   Studies: Dg Chest 2 View  03/04/2013   *RADIOLOGY REPORT*  Clinical Data: Shortness of breath, dizziness, chest pain.  CHEST - 2 VIEW  Comparison: 01/01/2013  Findings: Stable appearance of postoperative change in the mediastinum.  Shallow inspiration.  Cardiac enlargement with dilated and tortuous thoracic  aorta.  Pulmonary vascularity is mildly increased.  No significant edema.  Atelectasis in the lung bases is new since previous study.  Small bilateral pleural effusions.  No pneumothorax.  Probable esophageal hiatal hernia behind the heart.  IMPRESSION: Cardiac enlargement with thoracic aortic dilatation.  Increasing pulmonary vascular congestion with small bilateral pleural effusions and basilar atelectasis new since previous study. Probable esophageal hiatal hernia behind the heart.   Original Report Authenticated By: Burman Nieves, M.D.    Scheduled Meds: . amiodarone  200 mg Oral Daily  . aspirin EC  325 mg Oral Daily  . diltiazem  120 mg Oral Daily  . enoxaparin (LOVENOX) injection  40 mg Subcutaneous Q24H  . furosemide  40 mg Intravenous Q12H  . metoprolol  100 mg Oral BID  .  mirtazapine  15 mg Oral QHS  . sodium chloride  3 mL Intravenous Q12H  . sodium chloride  3 mL Intravenous Q12H   Continuous Infusions:     Time spent: 20 minutes    [name]  [facility] Pager [phone]. If 7PM-7AM, please contact night-coverage at [email or web address], password [facility] 03/04/2013, 11:11 AM  LOS: 0 days

## 2013-03-04 NOTE — H&P (Signed)
[facility] History and Physical  [name] ZOX:[id number] DOB: July 20, 1928 DOA: 03/04/2013  Referring physician: [facility]. PCP: [name] Reeve, MD  Specialists: Dr.Skains . Cardiologist.                      Dr.Gerhardt. Cardiothoracic Surgeon.  Chief Complaint: Shortness of breath.  HPI: [name] is a 79 y.o. female with history of repair of subacute type A aortic dissection in March of this year, history of atrial fibrillation and hypertension presented to the ER because of shortness of breath. Patient had followed up with her surgeon yesterday. She states she was doing fine then but later in the evening she became short of breath. Over the last 2 days she's been also noticing increasing swelling in the lower extremities. Denies any productive cough fever chills or chest pain but she did have some chest discomfort. In the ER chest x-ray review features concerning for CHF and was given one dose of 40 mg IV Lasix and has been admitted for further management of CHF. Patient denies any nausea vomiting abdominal pain diarrhea. Patient presently is not in acute distress.   Review of Systems: As presented in the history of presenting illness, rest negative.  Past Medical History  Diagnosis Date  . Afib   . Aortic root aneurysm   . Hypertension    Past Surgical History  Procedure Laterality Date  . Bentall procedure N/A 11/11/2012    Procedure: BENTALL PROCEDURE;  Surgeon: Kerin Perna, MD;  Location: [facility] OR;  Service: Open Heart Surgery;  Laterality: N/A;  *CIRC ARREST*   . Intraoperative transesophageal echocardiogram N/A 11/11/2012    Procedure: INTRAOPERATIVE TRANSESOPHAGEAL ECHOCARDIOGRAM;  Surgeon: Kerin Perna, MD;  Location: [facility] OR;  Service: Open Heart Surgery;  Laterality: N/A;   Social History:  reports that she has never smoked. She does not have any smokeless tobacco history on file. She reports that she does not drink alcohol or use illicit drugs. Home where  does patient live-- Can do ADLs. Can patient participate in ADLs?  No Known Allergies  History reviewed. No pertinent family history.    Prior to Admission medications   Medication Sig Start Date End Date Taking? Authorizing Provider  acetaminophen (TYLENOL) 500 MG tablet Take 500 mg by mouth every 6 (six) hours as needed for pain.   Yes Historical Provider, MD  amiodarone (PACERONE) 200 MG tablet Take 200 mg by mouth daily.   Yes Historical Provider, MD  aspirin 81 MG chewable tablet Chew 81 mg by mouth daily.   Yes Historical Provider, MD  diltiazem (DILACOR XR) 120 MG 24 hr capsule Take 120 mg by mouth daily.   Yes Historical Provider, MD  metoprolol (LOPRESSOR) 100 MG tablet Take 100 mg by mouth 2 (two) times daily.   Yes Historical Provider, MD  mirtazapine (REMERON) 15 MG tablet Take 15 mg by mouth at bedtime.   Yes Historical Provider, MD   Physical Exam: Filed Vitals:   03/04/13 0040 03/04/13 0100 03/04/13 0115 03/04/13 0207  BP:  117/81 107/72 118/71  Pulse:  80 84 80  Temp:      TempSrc:      Resp:  28 34 23  Height:      Weight:      SpO2: 93% 93% 94% 93%     General:  Well-developed and nourished.  Eyes: Anicteric no pallor.  ENT: No discharge from ears eyes nose mouth.  Neck: No mass felt.  Cardiovascular: S1-S2  heard.  Respiratory: No rhonchi or crepitations.  Abdomen: Soft nontender. Bowel sounds present.  Skin: No rash.  Musculoskeletal: Bilateral lower extremity edema.  Psychiatric: Appears normal.  Neurologic: Alert awake oriented times place and person. Moves all extremities.  Labs on Admission:  Basic Metabolic Panel:  Recent Labs Lab 03/04/13 0109  NA 141  K 3.9  CL 107  CO2 25  GLUCOSE 157*  BUN 34*  CREATININE 1.20*  CALCIUM 8.6   Liver Function Tests: No results found for this basename: AST, ALT, ALKPHOS, BILITOT, PROT, ALBUMIN,  in the last 168 hours No results found for this basename: LIPASE, AMYLASE,  in the last 168  hours No results found for this basename: AMMONIA,  in the last 168 hours CBC:  Recent Labs Lab 03/04/13 0109  WBC 7.2  NEUTROABS 5.0  HGB 12.8  HCT 38.6  MCV 88.3  PLT 253   Cardiac Enzymes:  Recent Labs Lab 03/04/13 0109  TROPONINI <0.30    BNP (last 3 results)  Recent Labs  03/04/13 0109  PROBNP 6122.0*   CBG: No results found for this basename: GLUCAP,  in the last 168 hours  Radiological Exams on Admission: Dg Chest 2 View  03/04/2013   *RADIOLOGY REPORT*  Clinical Data: Shortness of breath, dizziness, chest pain.  CHEST - 2 VIEW  Comparison: 01/01/2013  Findings: Stable appearance of postoperative change in the mediastinum.  Shallow inspiration.  Cardiac enlargement with dilated and tortuous thoracic aorta.  Pulmonary vascularity is mildly increased.  No significant edema.  Atelectasis in the lung bases is new since previous study.  Small bilateral pleural effusions.  No pneumothorax.  Probable esophageal hiatal hernia behind the heart.  IMPRESSION: Cardiac enlargement with thoracic aortic dilatation.  Increasing pulmonary vascular congestion with small bilateral pleural effusions and basilar atelectasis new since previous study. Probable esophageal hiatal hernia behind the heart.   Original Report Authenticated By: Burman Nieves, M.D.    EKG: Independently reviewed. A. fib with rate control.  Assessment/Plan Principal Problem:   CHF, acute Active Problems:   Hypertension   Aortic aneurysm with dissection   Renal insufficiency   Atrial fibrillation   1. CHF last EF measured was on March 2014 is 50-55% - patient was given lasix 40 mg iv and is placed on every 12 hourly. Cycle cardiac markers. Closely follow intake output and daily weight and metabolic panel. Consult patients cardiologist Dr.Skains in AM. Check 2D ECHO. Check Doppler of the lower extremities. 2. Acute renal failure - check UA. Follow intake output and metabolic panel. Patient is presently  placed on IV Lasix for CHF. 3. Recent surgery for Type A subacute aneurysm repair - denies any chest pain presently. Consult cardiology in a.m.  4. Atrial fibrillation  - rate controlled. Not on Coumadin presented per cardiology and surgery. Continue rate limiting medications.  5. Hypertension - continue present medications.     Code Status: Full code.  Family Communication: None.   Disposition Plan: Admit to inpatient.     Dyami Umbach N. [facility] Pager [phone].   If 7PM-7AM, please contact night-coverage [email or web address] Password [facility] 03/04/2013, 4:24 AM

## 2013-03-04 NOTE — Progress Notes (Signed)
  Echocardiogram 2D Echocardiogram has been performed.  [name] 03/04/2013, 2:22 PM

## 2013-03-05 ENCOUNTER — Inpatient Hospital Stay (HOSPITAL_COMMUNITY): Payer: Medicare PPO

## 2013-03-05 DIAGNOSIS — I428 Other cardiomyopathies: Secondary | ICD-10-CM

## 2013-03-05 DIAGNOSIS — I10 Essential (primary) hypertension: Secondary | ICD-10-CM

## 2013-03-05 LAB — BASIC METABOLIC PANEL
BUN: 25 mg/dL — ABNORMAL HIGH (ref 6–23)
CO2: 28 mEq/L (ref 19–32)
Calcium: 8.8 mg/dL (ref 8.4–10.5)
Chloride: 104 mEq/L (ref 96–112)
Creatinine, Ser: 1.18 mg/dL — ABNORMAL HIGH (ref 0.50–1.10)
GFR calc Af Amer: 48 mL/min — ABNORMAL LOW (ref >90)
GFR calc non Af Amer: 41 mL/min — ABNORMAL LOW (ref >90)
Glucose, Bld: 100 mg/dL — ABNORMAL HIGH (ref 70–99)
Potassium: 2.9 mEq/L — ABNORMAL LOW (ref 3.5–5.1)
Sodium: 145 mEq/L (ref 135–145)

## 2013-03-05 LAB — PROTIME-INR
INR: 1.25 (ref 0.00–1.49)
Prothrombin Time: 15.4 seconds — ABNORMAL HIGH (ref 11.6–15.2)

## 2013-03-05 LAB — CBC
HCT: 38.7 % (ref 36.0–46.0)
Hemoglobin: 12.7 g/dL (ref 12.0–15.0)
MCH: 28.8 pg (ref 26.0–34.0)
MCHC: 32.8 g/dL (ref 30.0–36.0)
MCV: 87.8 fL (ref 78.0–100.0)
Platelets: 261 10*3/uL (ref 150–400)
RBC: 4.41 MIL/uL (ref 3.87–5.11)
RDW: 15.9 % — ABNORMAL HIGH (ref 11.5–15.5)
WBC: 8 10*3/uL (ref 4.0–10.5)

## 2013-03-05 LAB — POTASSIUM: Potassium: 3.6 mEq/L (ref 3.5–5.1)

## 2013-03-05 LAB — HEPARIN LEVEL (UNFRACTIONATED)
Heparin Unfractionated: 0.57 IU/mL (ref 0.30–0.70)
Heparin Unfractionated: 0.36 IU/mL (ref 0.30–0.70)

## 2013-03-05 LAB — MAGNESIUM: Magnesium: 1.6 mg/dL (ref 1.5–2.5)

## 2013-03-05 MED ORDER — LISINOPRIL 2.5 MG PO TABS
2.5000 mg | ORAL_TABLET | Freq: Every day | ORAL | Status: DC
  Administered 2013-03-05 – 2013-03-10 (×6): 2.5 mg via ORAL
  Filled 2013-03-05 (×6): qty 1

## 2013-03-05 MED ORDER — METOPROLOL TARTRATE 25 MG PO TABS
25.0000 mg | ORAL_TABLET | Freq: Two times a day (BID) | ORAL | Status: DC
  Administered 2013-03-05 – 2013-03-06 (×3): 25 mg via ORAL
  Filled 2013-03-05 (×5): qty 1

## 2013-03-05 MED ORDER — TECHNETIUM TC 99M SESTAMIBI GENERIC - CARDIOLITE
10.0000 | Freq: Once | INTRAVENOUS | Status: AC | PRN
  Administered 2013-03-05: 10 via INTRAVENOUS

## 2013-03-05 MED ORDER — DIGOXIN 125 MCG PO TABS
0.1250 mg | ORAL_TABLET | Freq: Every day | ORAL | Status: DC
  Administered 2013-03-05 – 2013-03-10 (×6): 0.125 mg via ORAL
  Filled 2013-03-05 (×6): qty 1

## 2013-03-05 MED ORDER — REGADENOSON 0.4 MG/5ML IV SOLN: 0.4000 mg | Freq: Once | INTRAVENOUS | Status: AC

## 2013-03-05 MED ORDER — WARFARIN SODIUM 2.5 MG PO TABS
2.5000 mg | ORAL_TABLET | Freq: Once | ORAL | Status: AC
  Administered 2013-03-05: 2.5 mg via ORAL
  Filled 2013-03-05: qty 1

## 2013-03-05 MED ORDER — POTASSIUM CHLORIDE 10 MEQ/100ML IV SOLN
10.0000 meq | INTRAVENOUS | Status: AC
  Administered 2013-03-05 (×3): 10 meq via INTRAVENOUS
  Filled 2013-03-05 (×3): qty 100

## 2013-03-05 MED ORDER — POTASSIUM CHLORIDE 10 MEQ/100ML IV SOLN
10.0000 meq | INTRAVENOUS | Status: AC
  Administered 2013-03-05: 10 meq via INTRAVENOUS
  Filled 2013-03-05 (×4): qty 100

## 2013-03-05 MED ORDER — REGADENOSON 0.4 MG/5ML IV SOLN
INTRAVENOUS | Status: AC
  Administered 2013-03-05: 0.4 mg via INTRAVENOUS
  Filled 2013-03-05: qty 5

## 2013-03-05 MED ORDER — TECHNETIUM TC 99M SESTAMIBI GENERIC - CARDIOLITE
30.0000 | Freq: Once | INTRAVENOUS | Status: AC | PRN
  Administered 2013-03-05: 30 via INTRAVENOUS

## 2013-03-05 NOTE — Progress Notes (Signed)
ANTICOAGULATION CONSULT NOTE - Follow Up Consult  Pharmacy Consult for heparin Indication: atrial fibrillation and DVT  Labs:  Recent Labs  03/04/13 0109 03/04/13 0735 03/04/13 1250 03/04/13 1626 03/05/13 0340  HGB 12.8 12.9  --   --  12.7  HCT 38.6 39.9  --   --  38.7  PLT 253 239  --   --  261  LABPROT  --   --   --   --  15.4*  INR  --   --   --   --  1.25  HEPARINUNFRC  --   --   --   --  0.57  CREATININE 1.20* 1.23*  --   --   --   TROPONINI <0.30 <0.30 <0.30 <0.30  --     Assessment/Plan:  79yo female therapeutic on heparin with initial dosing for DVT and Afib.  Will continue gtt at current rate and confirm stable with additional level.  Vernard Gambles, PharmD, BCPS  03/05/2013,5:28 AM

## 2013-03-05 NOTE — Progress Notes (Addendum)
Rechecked BP 120/88.  Gave lisinopril and asked that pt bp be rechecked in one hour due to pt being out of bed and toileting.  RN aware and will reassess need for IV lasix Dereck Ligas [facility] Dr. Blake Divine about BP medication and IV lasix due at 18:00 bp 96/69.  MD would like bp rechecked in one hour and if pressure better 120 sbp then give.  Otherwise, may give 20mg  IV lasix if pressure better and patient is not symptomatic.  Will continue to monitor. Currently patient is confused and getting out of bed with bed alarm on.  She is at risk for pulling out her IV as she tries to walk away from it. Pt in bed and will continue to monitor on bed alarm.  Pt is in a camera room Azucena Cecil, [employer]

## 2013-03-05 NOTE — Progress Notes (Addendum)
Subjective:  Currently down in Nuclear awaiting myocardial perfusion scan.  Objective:  Vital Signs in the last 24 hours: Temp:  [97.7 F (36.5 C)-97.8 F (36.6 C)] 97.8 F (36.6 C) (07/23 0450) Pulse Rate:  [87-94] 87 (07/23 0450) Resp:  [20-36] 24 (07/23 0450) BP: (109-113)/(72-77) 109/77 mmHg (07/23 0450) SpO2:  [93 %-94 %] 94 % (07/23 0450) Weight:  [61.462 kg (135 lb 8 oz)] 61.462 kg (135 lb 8 oz) (07/23 0450)  Intake/Output from previous day: 07/22 0701 - 07/23 0700 In: 578.8 [P.O.:480; I.V.:98.8] Out: 3625 [Urine:3625]   Physical Exam: Currently a different location.    Lab Results:  Recent Labs  03/04/13 0735 03/05/13 0340  WBC 7.0 8.0  HGB 12.9 12.7  PLT 239 261    Recent Labs  03/04/13 0735 03/05/13 0340  NA 143 145  K 3.7 2.9*  CL 105 104  CO2 26 28  GLUCOSE 122* 100*  BUN 32* 25*  CREATININE 1.23* 1.18*    Recent Labs  03/04/13 1250 03/04/13 1626  TROPONINI <0.30 <0.30   Hepatic Function Panel  Recent Labs  03/04/13 0735  PROT 6.4  ALBUMIN 3.1*  AST 34  ALT 40*  ALKPHOS 113  BILITOT 0.6     Imaging: Dg Chest 2 View  03/04/2013   *RADIOLOGY REPORT*  Clinical Data: Shortness of breath, dizziness, chest pain.  CHEST - 2 VIEW  Comparison: 01/01/2013  Findings: Stable appearance of postoperative change in the mediastinum.  Shallow inspiration.  Cardiac enlargement with dilated and tortuous thoracic aorta.  Pulmonary vascularity is mildly increased.  No significant edema.  Atelectasis in the lung bases is new since previous study.  Small bilateral pleural effusions.  No pneumothorax.  Probable esophageal hiatal hernia behind the heart.  IMPRESSION: Cardiac enlargement with thoracic aortic dilatation.  Increasing pulmonary vascular congestion with small bilateral pleural effusions and basilar atelectasis new since previous study. Probable esophageal hiatal hernia behind the heart.   Original Report Authenticated By: Burman Nieves, M.D.    Personally viewed.   Telemetry: Atrial fibrillation currently rate controlled Personally viewed.   Cardiac Studies:  Ejection fraction severely reduced in the 20% range, mild AI, mild MR  Assessment/Plan:  Principal Problem:   CHF, acute Active Problems:   Hypertension   Aortic aneurysm with dissection   Renal insufficiency   Atrial fibrillation  79 year old with newly discovered acute systolic heart failure, cardiomyopathy of unknown etiology, recent type A dissection status post repair in March of 2014.  1. Acute systolic heart failure-echocardiogram clearly demonstrates severely reduced ejection fraction which is a change from her previous echocardiogram. Possible etiologies include a tachycardia-induced cardiomyopathy. In review of my medical notes from office setting, on May 30 rate was 123 beats per minute. It is possible that her atrial fibrillation, rapid ventricular response, may be contributing to the newly discovered cardiomyopathy. It is also possible that this is been present postoperatively however chest x-ray comparison definitely shows change and increase cardiac diameter. I wonder if this is more of an acute process. I discussed with Dr. Maren Beach  Nonetheless, I agree with pulling back on the metoprolol from 100 mg twice a day (uptitrated previously to help with atrial fibrillation with rapid ventricular response). I will also discontinue her diltiazem given the possible negative inotropic qualities. I will add digoxin by mouth. We will continue with amiodarone. Like to add ACE inhibitor, uptitrate.  She is going for myocardial perfusion scan. She may very well need right and left heart catheterization. She was  not able to have this performed prior to her aortic root repair because of dissection.  2. Atrial fibrillation-Dr. Maren Beach feels comfortable at this point resuming Coumadin. Previously she had intramural hematoma, aortic. We will see how her rate control progresses  now that her diltiazem as well as beta blocker have been discontinued or reduced. Digoxin has been added.  3. Hypokalemia-replete.   Barbarann Kelly 03/05/2013, 77:43 AM    Addendum-4:58 PM  Nuclear stress test showed global hypokinesis, ejection fraction of 34% with no areas of significant ischemia identified. Heart volume end-diastolic volume is 84 mL.  Since discontinuation of diltiazem 120 mg as well as backing down from metoprolol 100 mg twice a day down to 12.5 g twice a day, her heart rate now is demonstrating atrial fibrillation with rapid ventricular response with heart rates in the 120s. I will gently increase her beta blocker. I will avoid diltiazem given negative inotropic properties.  I will also add low-dose lisinopril 2.5 mg once a day to initiate ACE inhibitor as part of her heart failure regimen.  Subjectively, she feels better today. She is out approximately 3.5 L.  Once again, let us hope that her cardiomyopathy is being driven by tachycardia, for rate control will assist with resolution.  Physical exam  General: Alert and oriented x3 no acute distress Cardiovascular: Irregularly irregular rhythm, tachycardic, soft systolic murmur heard best at apex Lungs: Clear to auscultation bilaterally Abdomen: Soft, nontender Extremities: Improved edema.

## 2013-03-05 NOTE — Care Management Note (Signed)
    Page 1 of 2   03/10/2013     5:28:28 PM   CARE MANAGEMENT NOTE 03/10/2013  Patient:  [name],[name]   Account Number:  [id number]  Date Initiated:  03/05/2013  Documentation initiated by:  Ellias Mcelreath  Subjective/Objective Assessment:   PT ADM ON 03/04/13 WITH CHEST PAIN, SOB, ACUTE SYSTOLIC HEART FAILURE.  PTA, PT LIVES WITH DAUGHTERS.     Action/Plan:   WILL FOLLOW FOR HOME NEEDS AS PT PROGRESSES.   Anticipated DC Date:  03/08/2013   Anticipated DC Plan:  HOME W HOME HEALTH SERVICES      DC Planning Services  CM consult      [facility] Choice  HOME HEALTH   Choice offered to / List presented to:  C-1 Patient        HH arranged  HH-1 RN      [facility] agency  [facility]   Status of service:  Completed, signed off Medicare Important Message given?   (If response is "NO", the following Medicare IM given date fields will be blank) Date Medicare IM given:   Date Additional Medicare IM given:    Discharge Disposition:  HOME W HOME HEALTH SERVICES  Per UR Regulation:  Reviewed for med. necessity/level of care/duration of stay  If discussed at Long Length of Stay Meetings, dates discussed:    Comments:  03/10/13 [name] Fowers,RN,BSN [phone] PT FOR DC HOME TODAY; NEEDS HH FOLLOW UP.  REFERRAL TO [facility], PER DAUGHTER, [name]'S CHOICE.(SPOKE TO HER BY PHONE [phone]).  START OF CARE 03/11/13 FOR PT/INR DRAW.  03/07/13 [name] Kincannon,RN,BSN [phone] P.T. EVAL TODAY; RECOMMEND NO P.T. FOLLOW UP AT DISCHARGE.

## 2013-03-05 NOTE — Progress Notes (Signed)
Pt. BP at 2100 109/82 with HR of 96.  Per MD request pt. Systolic BP less than 120 so Lasix held at this time. Will continue to monitor.   Thane Edu, RN

## 2013-03-05 NOTE — Progress Notes (Signed)
ANTICOAGULATION CONSULT NOTE - Follow Up Consult  Pharmacy Consult for Heparin/Warfarin Indication: atrial fibrillation and new LLE DVT  No Known Allergies  Patient Measurements: Height: 5\' 3"  (160 cm) Weight: 135 lb 8 oz (61.462 kg) IBW/kg (Calculated) : 52.4  Vital Signs: Temp: 98 F (36.7 C) (07/23 1350) Temp src: Oral (07/23 1350) BP: 123/80 mmHg (07/23 1350) Pulse Rate: 85 (07/23 1350)  Labs:  Recent Labs  03/04/13 0109 03/04/13 0735 03/04/13 1250 03/04/13 1626 03/05/13 0340 03/05/13 1354  HGB 12.8 12.9  --   --  12.7  --   HCT 38.6 39.9  --   --  38.7  --   PLT 253 239  --   --  261  --   LABPROT  --   --   --   --  15.4*  --   INR  --   --   --   --  1.25  --   HEPARINUNFRC  --   --   --   --  0.57 0.36  CREATININE 1.20* 1.23*  --   --  1.18*  --   TROPONINI <0.30 <0.30 <0.30 <0.30  --   --     Estimated Creatinine Clearance: 29.9 ml/min (by C-G formula based on Cr of 1.18).   Medications:  Scheduled:  . amiodarone  200 mg Oral Daily  . aspirin EC  325 mg Oral Daily  . digoxin  0.125 mg Oral Daily  . docusate sodium  100 mg Oral Daily  . furosemide  40 mg Intravenous Q12H  . metoprolol  12.5 mg Oral BID  . mirtazapine  15 mg Oral QHS  . potassium chloride  10 mEq Intravenous Q1 Hr x 4  . sodium chloride  3 mL Intravenous Q12H  . sodium chloride  3 mL Intravenous Q12H  . warfarin  2.5 mg Oral ONCE-1800  . warfarin   Does not apply Once  . Warfarin - Pharmacist Dosing Inpatient   Does not apply q1800    Assessment: 79 y/o F with chronic afib with new LLE DVT. Had been off warfarin due to intraumural hematoma in the aorta but is now s/p surgery from march 2014. To restart with heparin/warfarin bridge. CBC stable. Scr 1.18 with CrCl ~ 30. HL 0.36 this evening on 1000 units/hr of heparin, but apparently the IV has infiltrated and heparin is OFF. Will f/u this evening to re-start. INR 1.25, noted on increased dose of amio than when she was previously on  warfarin so will dose more cautiously.   Goal of Therapy:  INR 2-3 Heparin level 0.3-0.7 units/ml Monitor platelets by anticoagulation protocol: Yes   Plan:  -Warfarin 2.5 mg x 1 tonight at 1800 -Daily PT/INR -Monitor for bleeding  -F/U re-start heparin after new access is placed, likely restart at 1000 units/hr -Daily CBC/HL  Thank you for allowing me to take part in this patient's care,  Abran Duke, PharmD Clinical Pharmacist Phone: [phone] Pager: [phone] 03/05/2013 3:55 PM

## 2013-03-05 NOTE — Progress Notes (Signed)
[facility] PROGRESS NOTE  [name] ZOX:[id number] DOB: 07/12/1928 DOA: 03/04/2013 PCP: Emeterio Reeve, MD  HPI:  Patient admitted  for acute CHF exacerbation.    Assessment/Plan:   CHF exacerbation Monitor on tele. IV lasix q 12hr. Repeat 2D echo showed global decreased LV function, . Last echo from march with normal EF. Cardiology consult from Dr Anne Fu requested and appreciate recommendations Monitor strict I/O and daily weights. I/O last 3 completed shifts: In: 578.8 [P.O.:480; I.V.:98.8] Out: 4625 [Urine:4625]   Myo view this am. Appropriate diuresis.     Afib  rate controlled. Continue metorpolol. Cardizem recently started by Dr Anne Fu. continue amiodarone. 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 Continue ASA.    Recent ascending aortic dissection repair Sx in march. Follows with Dr Zenaida Niece tright as outpt. Appreciate Dr Donata Clay recommendations.    AKI  mild.improving. . Will monitor  New left soleal DVT: on IV heparin and po coumadin.   Hypokalemia: replete as needed. Magnesium  Level added on.     Code Status: full Family Communication: none Disposition Plan: home once stable   Consultants:  Dr Anne Fu [facility] cards)  Cardio thoracic from DR Donata Clay  Procedures:  none  Antibiotics:  none  HPI/Subjective: Comfortable.  Objective: Filed Vitals:   03/04/13 2353 03/05/13 0241 03/05/13 0300 03/05/13 0450  BP:    109/77  Pulse:    87  Temp:    97.8 F (36.6 C)  TempSrc:    Oral  Resp: 24 20 24 24   Height:      Weight:    61.462 kg (135 lb 8 oz)  SpO2:    94%    Intake/Output Summary (Last 24 hours) at 03/05/13 0739 Last data filed at 03/05/13 8119  Gross per 24 hour  Intake 578.82 ml  Output   3425 ml  Net -2846.18 ml   Filed Weights   03/04/13 0014 03/04/13 0558 03/05/13 0450  Weight: 64.524 kg (142 lb 4 oz) 64.6 kg (142 lb 6.7 oz) 61.462 kg (135 lb 8 oz)    Exam:   General:   Elderly female appears fatigued  HEENT: no pallor, no JVD  Chest: bibasilar crackles present   CVS: S1&S2 irregular  Abd: soft, NT, ND, BS+  Ext: 1 + edema B/L  CNS: AAOX3, poor historian    Data Reviewed: Basic Metabolic Panel:  Recent Labs Lab 03/04/13 0109 03/04/13 0735 03/05/13 0340  NA 141 143 145  K 3.9 3.7 2.9*  CL 107 105 104  CO2 25 26 28   GLUCOSE 157* 122* 100*  BUN 34* 32* 25*  CREATININE 1.20* 1.23* 1.18*  CALCIUM 8.6 8.9 8.8   Liver Function Tests:  Recent Labs Lab 03/04/13 0735  AST 34  ALT 40*  ALKPHOS 113  BILITOT 0.6  PROT 6.4  ALBUMIN 3.1*   No results found for this basename: LIPASE, AMYLASE,  in the last 168 hours No results found for this basename: AMMONIA,  in the last 168 hours CBC:  Recent Labs Lab 03/04/13 0109 03/04/13 0735 03/05/13 0340  WBC 7.2 7.0 8.0  NEUTROABS 5.0 4.1  --   HGB 12.8 12.9 12.7  HCT 38.6 39.9 38.7  MCV 88.3 88.7 87.8  PLT 253 239 261   Cardiac Enzymes:  Recent Labs Lab 03/04/13 0109 03/04/13 0735 03/04/13 1250 03/04/13 1626  TROPONINI <0.30 <0.30 <0.30 <0.30   BNP (last 3 results)  Recent Labs  03/04/13 0109  PROBNP 6122.0*   CBG: No results found for this basename: GLUCAP,  in the last 168 hours  No results found for this or any previous visit (from the past 240 hour(s)).   Studies: Dg Chest 2 View  03/04/2013   *RADIOLOGY REPORT*  Clinical Data: Shortness of breath, dizziness, chest pain.  CHEST - 2 VIEW  Comparison: 01/01/2013  Findings: Stable appearance of postoperative change in the mediastinum.  Shallow inspiration.  Cardiac enlargement with dilated and tortuous thoracic aorta.  Pulmonary vascularity is mildly increased.  No significant edema.  Atelectasis in the lung bases is new since previous study.  Small bilateral pleural effusions.  No pneumothorax.  Probable esophageal hiatal hernia behind the heart.  IMPRESSION: Cardiac enlargement with thoracic aortic dilatation.   Increasing pulmonary vascular congestion with small bilateral pleural effusions and basilar atelectasis new since previous study. Probable esophageal hiatal hernia behind the heart.   Original Report Authenticated By: Burman Nieves, M.D.    Scheduled Meds: . amiodarone  200 mg Oral Daily  . aspirin EC  325 mg Oral Daily  . diltiazem  120 mg Oral Daily  . docusate sodium  100 mg Oral Daily  . furosemide  40 mg Intravenous Q12H  . metoprolol  12.5 mg Oral BID  . mirtazapine  15 mg Oral QHS  . sodium chloride  3 mL Intravenous Q12H  . sodium chloride  3 mL Intravenous Q12H  . warfarin   Does not apply Once  . Warfarin - Pharmacist Dosing Inpatient   Does not apply q1800   Continuous Infusions: . heparin 1,000 Units/hr (03/04/13 2034)      Time spent: 25 minutes    [facility]  [facility] Pager [phone]. If 7PM-7AM, please contact night-coverage at [email or web address], password [facility] 03/05/2013, 7:39 AM  LOS: 1 day

## 2013-03-05 NOTE — Plan of Care (Signed)
Problem: Phase I Progression Outcomes Goal: EF % per last Echo/documented,Core Reminder form on chart Outcome: Completed/Met Date Met:  03/05/13 45%

## 2013-03-06 DIAGNOSIS — I428 Other cardiomyopathies: Secondary | ICD-10-CM

## 2013-03-06 DIAGNOSIS — I509 Heart failure, unspecified: Secondary | ICD-10-CM

## 2013-03-06 LAB — CBC
HCT: 36.4 % (ref 36.0–46.0)
Hemoglobin: 12 g/dL (ref 12.0–15.0)
MCH: 28.6 pg (ref 26.0–34.0)
MCHC: 33 g/dL (ref 30.0–36.0)
MCV: 86.9 fL (ref 78.0–100.0)
Platelets: 258 10*3/uL (ref 150–400)
RBC: 4.19 MIL/uL (ref 3.87–5.11)
RDW: 16.1 % — ABNORMAL HIGH (ref 11.5–15.5)
WBC: 7.2 10*3/uL (ref 4.0–10.5)

## 2013-03-06 LAB — PROTIME-INR
INR: 1.3 (ref 0.00–1.49)
Prothrombin Time: 15.9 seconds — ABNORMAL HIGH (ref 11.6–15.2)

## 2013-03-06 LAB — HEPARIN LEVEL (UNFRACTIONATED): Heparin Unfractionated: 0.51 IU/mL (ref 0.30–0.70)

## 2013-03-06 LAB — BASIC METABOLIC PANEL
BUN: 20 mg/dL (ref 6–23)
CO2: 25 mEq/L (ref 19–32)
Calcium: 8.4 mg/dL (ref 8.4–10.5)
Chloride: 105 mEq/L (ref 96–112)
Creatinine, Ser: 0.99 mg/dL (ref 0.50–1.10)
GFR calc Af Amer: 59 mL/min — ABNORMAL LOW (ref >90)
GFR calc non Af Amer: 51 mL/min — ABNORMAL LOW (ref >90)
Glucose, Bld: 97 mg/dL (ref 70–99)
Potassium: 3 mEq/L — ABNORMAL LOW (ref 3.5–5.1)
Sodium: 142 mEq/L (ref 135–145)

## 2013-03-06 LAB — POTASSIUM: Potassium: 4.2 mEq/L (ref 3.5–5.1)

## 2013-03-06 LAB — MAGNESIUM: Magnesium: 1.6 mg/dL (ref 1.5–2.5)

## 2013-03-06 LAB — PRO B NATRIURETIC PEPTIDE: Pro B Natriuretic peptide (BNP): 5114 pg/mL — ABNORMAL HIGH (ref 0–450)

## 2013-03-06 MED ORDER — FUROSEMIDE 40 MG PO TABS
40.0000 mg | ORAL_TABLET | Freq: Every day | ORAL | Status: DC
  Administered 2013-03-06 – 2013-03-10 (×5): 40 mg via ORAL
  Filled 2013-03-06 (×5): qty 1

## 2013-03-06 MED ORDER — MAGNESIUM SULFATE IN D5W 10-5 MG/ML-% IV SOLN
1.0000 g | Freq: Once | INTRAVENOUS | Status: AC
  Administered 2013-03-06: 1 g via INTRAVENOUS
  Filled 2013-03-06: qty 100

## 2013-03-06 MED ORDER — WARFARIN SODIUM 5 MG PO TABS
5.0000 mg | ORAL_TABLET | Freq: Once | ORAL | Status: DC
  Filled 2013-03-06: qty 1

## 2013-03-06 MED ORDER — POTASSIUM CHLORIDE CRYS ER 20 MEQ PO TBCR
40.0000 meq | EXTENDED_RELEASE_TABLET | Freq: Once | ORAL | Status: AC
  Administered 2013-03-06: 40 meq via ORAL
  Filled 2013-03-06: qty 2

## 2013-03-06 MED ORDER — POTASSIUM CHLORIDE 10 MEQ/100ML IV SOLN
10.0000 meq | INTRAVENOUS | Status: AC
  Administered 2013-03-06 (×2): 10 meq via INTRAVENOUS
  Filled 2013-03-06 (×3): qty 100

## 2013-03-06 NOTE — Progress Notes (Signed)
ANTICOAGULATION CONSULT NOTE - Follow Up Consult   Pharmacy Consult :  Coumadin with Heparin bridging  Indication: DVT  Dosing Weight: 61 kg  Labs:  Recent Labs  03/04/13 0735 03/05/13 0340 03/05/13 1354 03/06/13 0415  HGB 12.9 12.7  --  12.0  HCT 39.9 38.7  --  36.4  PLT 239 261  --  258  INR  --  1.25  --  1.30  HEPARINUNFRC  --  0.57 0.36 0.51  CREATININE 1.23* 1.18*  --  0.99   Lab Results  Component Value Date   INR 1.30 03/06/2013   INR 1.25 03/05/2013   INR 1.64* 11/11/2012   Estimated Creatinine Clearance: 35.6 ml/min (by C-G formula based on Cr of 0.99).  Medications:  Scheduled:  . amiodarone  200 mg Oral Daily  . aspirin EC  325 mg Oral Daily  . digoxin  0.125 mg Oral Daily  . docusate sodium  100 mg Oral Daily  . furosemide  40 mg Oral Daily  . lisinopril  2.5 mg Oral Daily  . magnesium sulfate 1 - 4 g bolus IVPB  1 g Intravenous Once  . metoprolol  25 mg Oral BID  . mirtazapine  15 mg Oral QHS  . potassium chloride  40 mEq Oral Once  . sodium chloride  3 mL Intravenous Q12H  . sodium chloride  3 mL Intravenous Q12H  . warfarin   Does not apply Once  . Warfarin - Pharmacist Dosing Inpatient   Does not apply q1800   Infusions:  . heparin 1,000 Units/hr (03/06/13 0700)    Assessment:  Day # 3 of concurrent Coumadin with Heparin bridging for treatment of a DVT.  Patient is also on Amiodarone, a potent potentiator of Coumadin's INR effect.  INR 1.3,  H/H/P stable.  No bleeding complications noted   Goal of Therapy:  INR 2-3  Heparin level 0.3-0.7 units/ml   Plan:   Continue Coumadin with Heparin bridging .  Heparin to continue at 1000 units/hr.  Coumadin 5 mg po today. Daily Heparin level, INR, CBC. Monitor for bleeding complications   Laurena Bering, Pharm.D.  03/06/2013,1:09 PM

## 2013-03-06 NOTE — Progress Notes (Signed)
Subjective:  Easily arousable, states that she ate all of her breakfast today. She is smiling. She's feeling better. Atrial fibrillation still with movement is demonstrating heart rates in the 120 range.  Objective:  Vital Signs in the last 24 hours: Temp:  [98 F (36.7 C)-98.5 F (36.9 C)] 98.5 F (36.9 C) (07/24 0414) Pulse Rate:  [72-112] 72 (07/24 0414) Resp:  [18-20] 18 (07/24 0414) BP: (96-125)/(60-96) 109/60 mmHg (07/24 0414) SpO2:  [90 %-95 %] 90 % (07/24 0414) Weight:  [61.417 kg (135 lb 6.4 oz)] 61.417 kg (135 lb 6.4 oz) (07/24 0414)  Intake/Output from previous day:     Physical Exam: General: Well developed, well nourished, in no acute distress, was sleeping comfortably, easily arousable, smiling. Head:  Normocephalic and atraumatic. Lungs: Clear to auscultation and percussion. Heart: Irregularly irregular, at times tachycardic.  Soft systolic murmur right upper sternal border, rubs or gallops. No significant JVD appreciated Abdomen: soft, non-tender, positive bowel sounds. Extremities: No clubbing or cyanosis. Decreased edema. Neurologic: Alert and oriented x 3.    Lab Results:  Recent Labs  03/05/13 0340 03/06/13 0415  WBC 8.0 7.2  HGB 12.7 12.0  PLT 261 258    Recent Labs  03/05/13 0340 03/05/13 2202 03/06/13 0415  NA 145  --  142  K 2.9* 3.6 3.0*  CL 104  --  105  CO2 28  --  25  GLUCOSE 100*  --  97  BUN 25*  --  20  CREATININE 1.18*  --  0.99    Recent Labs  03/04/13 1250 03/04/13 1626  TROPONINI <0.30 <0.30   Hepatic Function Panel  Recent Labs  03/04/13 0735  PROT 6.4  ALBUMIN 3.1*  AST 34  ALT 40*  ALKPHOS 113  BILITOT 0.6   No results found for this basename: CHOL,  in the last 72 hours No results found for this basename: PROTIME,  in the last 72 hours  Imaging: Dg Chest 2 View  03/05/2013   *RADIOLOGY REPORT*  Clinical Data: Congestive heart failure.  CHEST - 2 VIEW  Comparison: 03/04/2013 and 01/01/2013  Findings:  Cardiomegaly and tortuosity and dilatation of the thoracic aorta is unchanged.  Pulmonary vascularity is within normal limits.  There are tiny bilateral effusions, unchanged. There is focal atelectasis at the left lung base posteriorly and in the lingula, slightly increased.  Aeration at the right base has improved.  IMPRESSION: Interval improvement in pulmonary vascularity and aeration at the right lung base.  Slight increased focal atelectasis at the left base.   Original Report Authenticated By: Francene Boyers, M.D.   Nm Myocar Multi W/spect W/wall Motion / Ef  03/05/2013   *RADIOLOGY REPORT*  Clinical Data:  History of atrial fibrillation, hypertension, congestive heart failure and aortic root aneurysm.  MYOCARDIAL IMAGING WITH SPECT (REST AND PHARMACOLOGIC-STRESS) GATED LEFT VENTRICULAR WALL MOTION STUDY LEFT VENTRICULAR EJECTION FRACTION  Technique:  Resting myocardial SPECT imaging was initially performed after intravenous administration of radiopharmaceutical. Myocardial SPECT was subsequently performed after additional radiopharmaceutical injection during pharmacologic-stress (Lexiscan)supervised by the Cardiology staff.  Quantitative gated imaging was also performed to evaluate left ventricular wall motion, and estimate left ventricular ejection fraction.  Radiopharmaceutical:  10 mCi Tc-22m sestamibi at rest and 30 mCi during stress.  Comparison: Chest radiographs 03/05/2013.  Chest CT 11/06/2012.  Findings:  SPECT images demonstrate normal left ventricular activity aside from a small fixed perfusion defect involving the inferolateral apex.  There are no reversible perfusion defects.  Gated cine images  were reviewed on the workstation and demonstrate global hypokinesis.  No focal wall motion abnormalities identified at the apex.   The QGS ejection fraction measured at rest is 34% with an end diastolic volume of 84 ml and an end-systolic volume of 55 ml.  IMPRESSION:  1.  Global hypokinesis with  calculated left ventricular ejection fraction of 34%. 2.  Small fixed perfusion defects at the inferolateral apex is without focal wall motion abnormality and may reflect apical thinning as opposed to focal scar. 3.  No evidence of pharmacologically induced myocardial ischemia.   Original Report Authenticated By: Carey Bullocks, M.D.   Personally viewed.   Telemetry: AFIB 90-125 (when up and eating) Personally viewed.   Cardiac Studies:  NUC - no ischemia, EF 34% (global) ECHO: EF 25% mild AI, mild MR  Assessment/Plan:   79 year old with acute systolic heart failure, worsening ejection fraction, atrial fibrillation persisted with tachycardia, recent type A dissection repair, DVT, chronic anticoagulation.  1. Acute systolic heart failure   - Lasix was held last night because of blood pressure 109/82  - She is clearly more comfortable from a breathing perspective, states that she ate all of her breakfast, likely less congested with decreased overall intravascular volume.  - I will transition to Lasix 40 mg by mouth daily. She will need maintenance dosing.  - Digoxin was started yesterday.  - Low-dose ACE inhibitor lisinopril 2.5 mg was started yesterday. We will continue to try to utilize as pressure allows.  - Once again, I wonder if her worsening cardiomyopathy may be a result of tachycardia. Continuing to try to rate control her atrial fibrillation.  - Overall clinical improvement. BNP is also slightly down.  - Net out 4 liters, weight down from 64.6 down to 61.4kg  2. Atrial fibrillation  - Continuing with amiodarone given her reduced ejection fraction  - Digoxin started yesterday ( suboptimal rate control)  - Metoprolol slightly increased from 12.5 up to 25 mg twice a day yesterday. Let us see how she performs today especially with activity.  I would like to see her heart rates on average less than 110, optimally less than 100. When able, we will convert to metoprolol succinate  (indicated in cardiomyopathy).  3. Chronic anticoagulation  - Currently on Coumadin. Heparin IV. DVT noted.  - Awaiting therapeutic INR  4. DVT  - Left soleal vein.  5. Hypokalemia  - Replete    [name] 03/06/2013, 8:12 AM

## 2013-03-06 NOTE — Progress Notes (Signed)
  Subjective: CHF after urgent repair of type A dissection 6 months ago No evidence of AI but with significant global LV dysfunction Doubt coronary disease with normal myocardial perfusion study showing no evidence of ischemia Symptoms improve with diuresis and now titrating meds for heart failure We'll hold on CTA of thoracic aorta and performed later to my office as outpatient. Symptoms may improve with anticoagulation for DVT--patient may have sustained recent or emboli Agree with heparin bridge to Coumadin therapeutic anticoagulation  Objective: Vital signs in last 24 hours: Temp:  [98 F (36.7 C)-98.5 F (36.9 C)] 98.5 F (36.9 C) (07/24 0414) Pulse Rate:  [72-112] 72 (07/24 0414) Cardiac Rhythm:  [-] Atrial fibrillation (07/24 0759) Resp:  [18-20] 18 (07/24 0414) BP: (96-125)/(60-96) 109/60 mmHg (07/24 0414) SpO2:  [90 %-95 %] 90 % (07/24 0414) Weight:  [135 lb 6.4 oz (61.417 kg)] 135 lb 6.4 oz (61.417 kg) (07/24 0414)  Hemodynamic parameters for last 24 hours:   atrial fibrillation heart rate 100 125  Intake/Output from previous day:   Intake/Output this shift: Total I/O In: 360 [P.O.:360] Out: -     Lab Results:  Recent Labs  03/05/13 0340 03/06/13 0415  WBC 8.0 7.2  HGB 12.7 12.0  HCT 38.7 36.4  PLT 261 258   BMET:  Recent Labs  03/05/13 0340 03/05/13 2202 03/06/13 0415  NA 145  --  142  K 2.9* 3.6 3.0*  CL 104  --  105  CO2 28  --  25  GLUCOSE 100*  --  97  BUN 25*  --  20  CREATININE 1.18*  --  0.99  CALCIUM 8.8  --  8.4    PT/INR:  Recent Labs  03/06/13 0415  LABPROT 15.9*  INR 1.30   ABG    Component Value Date/Time   PHART 7.434 11/13/2012 0554   HCO3 26.4* 11/13/2012 0554   TCO2 28 11/13/2012 0554   ACIDBASEDEF 2.0 11/12/2012 0743   O2SAT 94.0 11/13/2012 0554   CBG (last 3)  No results found for this basename: GLUCAP,  in the last 72 hours  Assessment/Plan: S/P   repair of type A dissection 6 months ago Continue current care  with anticoagulation and titration of heart failure meds.   LOS: 2 days    VAN TRIGT III,Amarra Sawyer 03/06/2013

## 2013-03-06 NOTE — Progress Notes (Signed)
[facility] PROGRESS NOTE  [name] ZOX:[id number] DOB: [birth year] DOA: 03/04/2013 PCP: Emeterio Reeve, MD  HPI:  Patient admitted  for acute CHF exacerbation.    Assessment/Plan:   CHF exacerbation Monitor on tele.  She was started on IV lasix q 12hrt ransitioned to oral lasix.  Repeat 2D echo showed global decreased LV function, . Last echo from march with normal EF. Cardiology consult from Dr Anne Fu requested and appreciate recommendations Monitor strict I/O and daily weights. I/O last 3 completed shifts: In: 730 [P.O.:360; Other:370] Out: -    Myo view showed 1. Global hypokinesis with calculated left ventricular ejection fraction of 34%. 2. Small fixed perfusion defects at the inferolateral apex is without focal wall motion abnormality and may reflect apical thinning as opposed to focal scar. 3. No evidence of pharmacologically induced myocardial ischemia      Afib  rate controlled. Continue metorpolol. Cardizem recently started by Dr Anne Fu. continue amiodarone. 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 Continue ASA.    Recent ascending aortic dissection repair Sx in march. Follows with Dr Zenaida Niece tright as outpt. Appreciate Dr Donata Clay recommendations.    AKI  mild.improving. . Will monitor  New left soleal DVT: on IV heparin and po coumadin. INR SUBtherapeutic.   Hypokalemia: replete as needed.   DVT prophylaxis.     Code Status: full Family Communication: none Disposition Plan: home once stable   Consultants:  Dr Anne Fu [facility] cards)  Cardio thoracic from DR Donata Clay  Procedures:  none  Antibiotics:  none  HPI/Subjective: Comfortable. Reported that she is getting bored and nobody comes to talk to her. Denies any chest pain or sob.   Objective: Filed Vitals:   03/05/13 2207 03/05/13 2209 03/06/13 0414 03/06/13 1451  BP: 109/75  109/60 89/65  Pulse:  112 72 108  Temp:   98.5 F (36.9 C)  97.6 F (36.4 C)  TempSrc:   Oral Oral  Resp:   18 18  Height:      Weight:   61.417 kg (135 lb 6.4 oz)   SpO2:   90% 94%    Intake/Output Summary (Last 24 hours) at 03/06/13 1908 Last data filed at 03/06/13 1800  Gross per 24 hour  Intake    730 ml  Output      0 ml  Net    730 ml   Filed Weights   03/04/13 0558 03/05/13 0450 03/06/13 0414  Weight: 64.6 kg (142 lb 6.7 oz) 61.462 kg (135 lb 8 oz) 61.417 kg (135 lb 6.4 oz)    Exam:   General:  Elderly female appears fatigued  HEENT: no pallor, no JVD  Chest: bibasilar crackles present   CVS: S1&S2 irregular  Abd: soft, NT, ND, BS+  Ext: 1 + edema B/L      Data Reviewed: Basic Metabolic Panel:  Recent Labs Lab 03/04/13 0109 03/04/13 0735 03/05/13 0340 03/05/13 2202 03/06/13 0415  NA 141 143 145  --  142  K 3.9 3.7 2.9* 3.6 3.0*  CL 107 105 104  --  105  CO2 25 26 28   --  25  GLUCOSE 157* 122* 100*  --  97  BUN 34* 32* 25*  --  20  CREATININE 1.20* 1.23* 1.18*  --  0.99  CALCIUM 8.6 8.9 8.8  --  8.4  MG  --   --  1.6  --  1.6   Liver Function Tests:  Recent Labs Lab 03/04/13 0735  AST 34  ALT 40*  ALKPHOS 113  BILITOT 0.6  PROT 6.4  ALBUMIN 3.1*   No results found for this basename: LIPASE, AMYLASE,  in the last 168 hours No results found for this basename: AMMONIA,  in the last 168 hours CBC:  Recent Labs Lab 03/04/13 0109 03/04/13 0735 03/05/13 0340 03/06/13 0415  WBC 7.2 7.0 8.0 7.2  NEUTROABS 5.0 4.1  --   --   HGB 12.8 12.9 12.7 12.0  HCT 38.6 39.9 38.7 36.4  MCV 88.3 88.7 87.8 86.9  PLT 253 239 261 258   Cardiac Enzymes:  Recent Labs Lab 03/04/13 0109 03/04/13 0735 03/04/13 1250 03/04/13 1626  TROPONINI <0.30 <0.30 <0.30 <0.30   BNP (last 3 results)  Recent Labs  03/04/13 0109 03/06/13 0415  PROBNP 6122.0* 5114.0*   CBG: No results found for this basename: GLUCAP,  in the last 168 hours  No results found for this or any previous visit (from the past 240  hour(s)).   Studies: Dg Chest 2 View  03/05/2013   *RADIOLOGY REPORT*  Clinical Data: Congestive heart failure.  CHEST - 2 VIEW  Comparison: 03/04/2013 and 01/01/2013  Findings: Cardiomegaly and tortuosity and dilatation of the thoracic aorta is unchanged.  Pulmonary vascularity is within normal limits.  There are tiny bilateral effusions, unchanged. There is focal atelectasis at the left lung base posteriorly and in the lingula, slightly increased.  Aeration at the right base has improved.  IMPRESSION: Interval improvement in pulmonary vascularity and aeration at the right lung base.  Slight increased focal atelectasis at the left base.   Original Report Authenticated By: Francene Boyers, M.D.   Nm Myocar Multi W/spect W/wall Motion / Ef  03/05/2013   *RADIOLOGY REPORT*  Clinical Data:  History of atrial fibrillation, hypertension, congestive heart failure and aortic root aneurysm.  MYOCARDIAL IMAGING WITH SPECT (REST AND PHARMACOLOGIC-STRESS) GATED LEFT VENTRICULAR WALL MOTION STUDY LEFT VENTRICULAR EJECTION FRACTION  Technique:  Resting myocardial SPECT imaging was initially performed after intravenous administration of radiopharmaceutical. Myocardial SPECT was subsequently performed after additional radiopharmaceutical injection during pharmacologic-stress (Lexiscan)supervised by the Cardiology staff.  Quantitative gated imaging was also performed to evaluate left ventricular wall motion, and estimate left ventricular ejection fraction.  Radiopharmaceutical:  10 mCi Tc-20m sestamibi at rest and 30 mCi during stress.  Comparison: Chest radiographs 03/05/2013.  Chest CT 11/06/2012.  Findings:  SPECT images demonstrate normal left ventricular activity aside from a small fixed perfusion defect involving the inferolateral apex.  There are no reversible perfusion defects.  Gated cine images were reviewed on the workstation and demonstrate global hypokinesis.  No focal wall motion abnormalities identified at the  apex.   The QGS ejection fraction measured at rest is 34% with an end diastolic volume of 84 ml and an end-systolic volume of 55 ml.  IMPRESSION:  1.  Global hypokinesis with calculated left ventricular ejection fraction of 34%. 2.  Small fixed perfusion defects at the inferolateral apex is without focal wall motion abnormality and may reflect apical thinning as opposed to focal scar. 3.  No evidence of pharmacologically induced myocardial ischemia.   Original Report Authenticated By: Carey Bullocks, M.D.    Scheduled Meds: . amiodarone  200 mg Oral Daily  . aspirin EC  325 mg Oral Daily  . digoxin  0.125 mg Oral Daily  . docusate sodium  100 mg Oral Daily  . furosemide  40 mg Oral Daily  . lisinopril  2.5  mg Oral Daily  . metoprolol  25 mg Oral BID  . mirtazapine  15 mg Oral QHS  . sodium chloride  3 mL Intravenous Q12H  . sodium chloride  3 mL Intravenous Q12H  . warfarin  5 mg Oral ONCE-1800  . warfarin   Does not apply Once  . Warfarin - Pharmacist Dosing Inpatient   Does not apply q1800   Continuous Infusions: . heparin 1,000 Units/hr (03/06/13 1800)      Time spent: 25 minutes    [name]  [facility] Pager [phone]. If 7PM-7AM, please contact night-coverage at [email or web address], password [facility] 03/06/2013, 7:08 PM  LOS: 2 days

## 2013-03-07 LAB — CBC
HCT: 40.7 % (ref 36.0–46.0)
Hemoglobin: 13.4 g/dL (ref 12.0–15.0)
MCH: 28.8 pg (ref 26.0–34.0)
MCHC: 32.9 g/dL (ref 30.0–36.0)
MCV: 87.3 fL (ref 78.0–100.0)
Platelets: 308 10*3/uL (ref 150–400)
RBC: 4.66 MIL/uL (ref 3.87–5.11)
RDW: 16.1 % — ABNORMAL HIGH (ref 11.5–15.5)
WBC: 8.1 10*3/uL (ref 4.0–10.5)

## 2013-03-07 LAB — HEPARIN LEVEL (UNFRACTIONATED)
Heparin Unfractionated: 0.35 IU/mL (ref 0.30–0.70)
Heparin Unfractionated: 0.53 IU/mL (ref 0.30–0.70)

## 2013-03-07 LAB — PROTIME-INR
INR: 1.2 (ref 0.00–1.49)
Prothrombin Time: 14.9 seconds (ref 11.6–15.2)

## 2013-03-07 MED ORDER — METOPROLOL TARTRATE 1 MG/ML IV SOLN: 5.0000 mg | Freq: Four times a day (QID) | INTRAVENOUS | Status: DC | PRN

## 2013-03-07 MED ORDER — METOPROLOL TARTRATE 50 MG PO TABS
50.0000 mg | ORAL_TABLET | Freq: Two times a day (BID) | ORAL | Status: DC
  Administered 2013-03-07 – 2013-03-10 (×7): 50 mg via ORAL
  Filled 2013-03-07 (×8): qty 1

## 2013-03-07 MED ORDER — WARFARIN SODIUM 6 MG PO TABS
6.0000 mg | ORAL_TABLET | Freq: Once | ORAL | Status: AC
  Administered 2013-03-07: 6 mg via ORAL
  Filled 2013-03-07: qty 1

## 2013-03-07 NOTE — Progress Notes (Signed)
Subjective:  Had some trouble sleeping. No CP, no SOB now. Feels better. Ate well. AFIB heart rate increased.   Objective:  Vital Signs in the last 24 hours: Temp:  [97.6 F (36.4 C)-98 F (36.7 C)] 98 F (36.7 C) (07/25 0459) Pulse Rate:  [108-125] 109 (07/25 0551) Resp:  [18] 18 (07/24 2028) BP: (89-131)/(49-94) 121/90 mmHg (07/25 0551) SpO2:  [94 %-99 %] 99 % (07/25 0459)  Intake/Output from previous day: 07/24 0701 - 07/25 0700 In: 730 [P.O.:360] Out: 2 [Urine:2]   Physical Exam: General: Well developed, well nourished, in no acute distress, was sleeping comfortably, easily arousable, smiling.  Head: Normocephalic and atraumatic.  Lungs: Clear to auscultation and percussion.  Heart: Irregularly irregular, at times tachycardic. Soft systolic murmur right upper sternal border, rubs or gallops. No significant JVD appreciated  Abdomen: soft, non-tender, positive bowel sounds.  Extremities: No clubbing or cyanosis. Decreased edema.  Neurologic: Alert and oriented x 3.     Lab Results:  Recent Labs  03/06/13 0415 03/07/13 0415  WBC 7.2 8.1  HGB 12.0 13.4  PLT 258 308    Recent Labs  03/05/13 0340  03/06/13 0415 03/06/13 2110  NA 145  --  142  --   K 2.9*  < > 3.0* 4.2  CL 104  --  105  --   CO2 28  --  25  --   GLUCOSE 100*  --  97  --   BUN 25*  --  20  --   CREATININE 1.18*  --  0.99  --   < > = values in this interval not displayed.  Recent Labs  03/04/13 1250 03/04/13 1626  TROPONINI <0.30 <0.30   Hepatic Function Panel No results found for this basename: PROT, ALBUMIN, AST, ALT, ALKPHOS, BILITOT, BILIDIR, IBILI,  in the last 72 hours No results found for this basename: CHOL,  in the last 72 hours No results found for this basename: PROTIME,  in the last 72 hours  Imaging: Dg Chest 2 View  03/05/2013   *RADIOLOGY REPORT*  Clinical Data: Congestive heart failure.  CHEST - 2 VIEW  Comparison: 03/04/2013 and 01/01/2013  Findings: Cardiomegaly and  tortuosity and dilatation of the thoracic aorta is unchanged.  Pulmonary vascularity is within normal limits.  There are tiny bilateral effusions, unchanged. There is focal atelectasis at the left lung base posteriorly and in the lingula, slightly increased.  Aeration at the right base has improved.  IMPRESSION: Interval improvement in pulmonary vascularity and aeration at the right lung base.  Slight increased focal atelectasis at the left base.   Original Report Authenticated By: Francene Boyers, M.D.   Nm Myocar Multi W/spect W/wall Motion / Ef  03/05/2013   *RADIOLOGY REPORT*  Clinical Data:  History of atrial fibrillation, hypertension, congestive heart failure and aortic root aneurysm.  MYOCARDIAL IMAGING WITH SPECT (REST AND PHARMACOLOGIC-STRESS) GATED LEFT VENTRICULAR WALL MOTION STUDY LEFT VENTRICULAR EJECTION FRACTION  Technique:  Resting myocardial SPECT imaging was initially performed after intravenous administration of radiopharmaceutical. Myocardial SPECT was subsequently performed after additional radiopharmaceutical injection during pharmacologic-stress (Lexiscan)supervised by the Cardiology staff.  Quantitative gated imaging was also performed to evaluate left ventricular wall motion, and estimate left ventricular ejection fraction.  Radiopharmaceutical:  10 mCi Tc-56m sestamibi at rest and 30 mCi during stress.  Comparison: Chest radiographs 03/05/2013.  Chest CT 11/06/2012.  Findings:  SPECT images demonstrate normal left ventricular activity aside from a small fixed perfusion defect involving the inferolateral apex.  There are no reversible perfusion defects.  Gated cine images were reviewed on the workstation and demonstrate global hypokinesis.  No focal wall motion abnormalities identified at the apex.   The QGS ejection fraction measured at rest is 34% with an end diastolic volume of 84 ml and an end-systolic volume of 55 ml.  IMPRESSION:  1.  Global hypokinesis with calculated left  ventricular ejection fraction of 34%. 2.  Small fixed perfusion defects at the inferolateral apex is without focal wall motion abnormality and may reflect apical thinning as opposed to focal scar. 3.  No evidence of pharmacologically induced myocardial ischemia.   Original Report Authenticated By: Carey Bullocks, M.D.   Personally viewed.   Telemetry: 140 AFIB noted, now 90-100 AFIB Personally viewed.   Cardiac Studies:   NUC - no ischemia, EF 34% (global)  ECHO: EF 25% mild AI, mild MR   Assessment/Plan:   79 year old with acute systolic heart failure, worsening ejection fraction, atrial fibrillation persisted with tachycardia, recent type A dissection repair, DVT, chronic anticoagulation.   1. Acute systolic heart failure  - Lasix was changed to PO 40mg  QD.  - She is clearly more comfortable from a breathing perspective, states that she ate all of her breakfast again, likely less congested with decreased overall intravascular volume. Just with trouble sleeping.  - Digoxin  - Low-dose ACE inhibitor lisinopril 2.5 mg. We will continue to try to utilize as pressure allows.  - Once again, I wonder if her worsening cardiomyopathy may be a result of tachycardia. Continuing to try to rate control her atrial fibrillation.  - Overall clinical improvement. BNP is also slightly down.  - Net out 3.3 liters, weight down from 64.6 down to 61.4kg   2. Atrial fibrillation  - Difficult to rate control.  - Continuing with amiodarone given her reduced ejection fraction  - Digoxin started yesterday ( suboptimal rate control)  - Metoprolol slightly increased from 12.5 up to 25 mg now to 50mg  twice a day yesterday. Let us see how she performs today especially with activity. I would like to see her heart rates on average less than 110, optimally less than 100. When able, we will convert to metoprolol succinate (indicated in cardiomyopathy).   3. Chronic anticoagulation  - Currently on Coumadin. Heparin IV.  DVT noted.  - Awaiting therapeutic INR   4. DVT  - Left soleal vein.   5. Hypokalemia  - Replete    [name] 03/07/2013, 8:51 AM

## 2013-03-07 NOTE — Evaluation (Signed)
Physical Therapy Evaluation Patient Details Name: [name] MRN: [id number] DOB: 08/02/1928 Today's Date: 03/07/2013 Time: 4401-0272 PT Time Calculation (min): 10 min  PT Assessment / Plan / Recommendation History of Present Illness  Pt adm with acute CHF exacerbation.  Clinical Impression  Pt doing well with mobility and no further PT needed.    PT Assessment  Patent does not need any further PT services    Follow Up Recommendations  No PT follow up    Does the patient have the potential to tolerate intense rehabilitation      Barriers to Discharge        Equipment Recommendations  None recommended by PT    Recommendations for Other Services     Frequency      Precautions / Restrictions Precautions Precautions: None   Pertinent Vitals/Pain Stable      Mobility  Transfers Transfers: Sit to Stand;Stand to Sit Sit to Stand: 6: Modified independent (Device/Increase time);With upper extremity assist;With armrests;From chair/3-in-1 Stand to Sit: 6: Modified independent (Device/Increase time);With upper extremity assist;With armrests;To chair/3-in-1 Ambulation/Gait Ambulation/Gait Assistance: 6: Modified independent (Device/Increase time) Ambulation Distance (Feet): 250 Feet Assistive device: None Gait Pattern: Within Functional Limits    Exercises     PT Diagnosis:    PT Problem List:   PT Treatment Interventions:       PT Goals(Current goals can be found in the care plan section) Acute Rehab PT Goals PT Goal Formulation: No goals set, d/c therapy  Visit Information  Last PT Received On: 03/07/13 Assistance Needed: +1 History of Present Illness: Pt adm with acute CHF exacerbation.       Prior Functioning  Home Living Family/patient expects to be discharged to:: Private residence Living Arrangements: Children Available Help at Discharge: Family Type of Home: Apartment Home Layout: Multi-level Prior Function Level of Independence:  Independent Communication Communication: Prefers language other than English    Cognition  Cognition Arousal/Alertness: Awake/alert Behavior During Therapy: WFL for tasks assessed/performed Overall Cognitive Status: Within Functional Limits for tasks assessed    Extremity/Trunk Assessment Upper Extremity Assessment Upper Extremity Assessment: Overall WFL for tasks assessed Lower Extremity Assessment Lower Extremity Assessment: Overall WFL for tasks assessed   Balance Balance Balance Assessed: Yes Static Standing Balance Static Standing - Balance Support: No upper extremity supported Static Standing - Level of Assistance: 7: Independent  End of Session PT - End of Session Activity Tolerance: Patient tolerated treatment well Patient left: in chair;with call bell/phone within reach  GP     [facility] 03/07/2013, 3:44 PM  [facility] PT [phone]

## 2013-03-07 NOTE — Progress Notes (Signed)
[facility] PROGRESS NOTE  [name] ZOX:[id number] DOB: [birth year] DOA: 03/04/2013 PCP: Emeterio Reeve, MD  HPI:  Patient admitted  for acute CHF exacerbation.    Assessment/Plan:   CHF exacerbation Monitor on tele.  She was started on IV lasix q 12hrt ransitioned to oral lasix.  Repeat 2D echo showed global decreased LV function, . Last echo from march with normal EF. Cardiology consult from Dr Anne Fu requested and appreciate recommendations Monitor strict I/O and daily weights. I/O last 3 completed shifts: In: 730 [P.O.:360; Other:370] Out: 5 [Urine:5]   Myo view showed 1. Global hypokinesis with calculated left ventricular ejection fraction of 34%. 2. Small fixed perfusion defects at the inferolateral apex is without focal wall motion abnormality and may reflect apical thinning as opposed to focal scar. 3. No evidence of pharmacologically induced myocardial ischemia      Afib  rate not well controlled. Metorpolol increased to 50 mg bid. continue amiodarone. 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 Continue ASA and coumadin. IV heparin for bridge. INR still sub therapeutic.    Recent ascending aortic dissection repair Sx in march. Follows with Dr Zenaida Niece tright as outpt. Appreciate Dr Donata Clay recommendations.    AKI  mild.improving. . Will monitor  New left soleal DVT: on IV heparin and po coumadin. INR SUBtherapeutic.   Hypokalemia: repleted as needed.   DVT prophylaxis.     Code Status: full Family Communication: none Disposition Plan:pending PT eval.    Consultants:  Dr Anne Fu [facility] cards)  Cardio thoracic from DR Donata Clay  Procedures:  none  Antibiotics:  none  HPI/Subjective: Comfortable. Reported that she is getting bored and nobody comes to talk to her. Denies any chest pain or sob.  Overnight her HR went to 140's, symptomatic.   Objective: Filed Vitals:   03/06/13 2354 03/07/13 0459  03/07/13 0551 03/07/13 0937  BP: 130/94 131/49 121/90   Pulse: 125 119 109 124  Temp:  98 F (36.7 C)    TempSrc:  Oral    Resp:      Height:      Weight:      SpO2:  99%      Intake/Output Summary (Last 24 hours) at 03/07/13 1013 Last data filed at 03/06/13 2300  Gross per 24 hour  Intake    370 ml  Output      2 ml  Net    368 ml   Filed Weights   03/04/13 0558 03/05/13 0450 03/06/13 0414  Weight: 64.6 kg (142 lb 6.7 oz) 61.462 kg (135 lb 8 oz) 61.417 kg (135 lb 6.4 oz)    Exam:   General:  Elderly female comfortable.   HEENT: no pallor, no JVD  Chest:CTAB   CVS: S1&S2 irregular TACHYCARDIC.  Abd: soft, NT, ND, BS+  Ext: 1 + edema B/L      Data Reviewed: Basic Metabolic Panel:  Recent Labs Lab 03/04/13 0109 03/04/13 0735 03/05/13 0340 03/05/13 2202 03/06/13 0415 03/06/13 2110  NA 141 143 145  --  142  --   K 3.9 3.7 2.9* 3.6 3.0* 4.2  CL 107 105 104  --  105  --   CO2 25 26 28   --  25  --   GLUCOSE 157* 122* 100*  --  97  --   BUN 34* 32* 25*  --  20  --   CREATININE 1.20* 1.23* 1.18*  --  0.99  --  CALCIUM 8.6 8.9 8.8  --  8.4  --   MG  --   --  1.6  --  1.6  --    Liver Function Tests:  Recent Labs Lab 03/04/13 0735  AST 34  ALT 40*  ALKPHOS 113  BILITOT 0.6  PROT 6.4  ALBUMIN 3.1*   No results found for this basename: LIPASE, AMYLASE,  in the last 168 hours No results found for this basename: AMMONIA,  in the last 168 hours CBC:  Recent Labs Lab 03/04/13 0109 03/04/13 0735 03/05/13 0340 03/06/13 0415 03/07/13 0415  WBC 7.2 7.0 8.0 7.2 8.1  NEUTROABS 5.0 4.1  --   --   --   HGB 12.8 12.9 12.7 12.0 13.4  HCT 38.6 39.9 38.7 36.4 40.7  MCV 88.3 88.7 87.8 86.9 87.3  PLT 253 239 261 258 308   Cardiac Enzymes:  Recent Labs Lab 03/04/13 0109 03/04/13 0735 03/04/13 1250 03/04/13 1626  TROPONINI <0.30 <0.30 <0.30 <0.30   BNP (last 3 results)  Recent Labs  03/04/13 0109 03/06/13 0415  PROBNP 6122.0* 5114.0*    CBG: No results found for this basename: GLUCAP,  in the last 168 hours  No results found for this or any previous visit (from the past 240 hour(s)).   Studies: Nm Myocar Multi W/spect W/wall Motion / Ef  03/05/2013   *RADIOLOGY REPORT*  Clinical Data:  History of atrial fibrillation, hypertension, congestive heart failure and aortic root aneurysm.  MYOCARDIAL IMAGING WITH SPECT (REST AND PHARMACOLOGIC-STRESS) GATED LEFT VENTRICULAR WALL MOTION STUDY LEFT VENTRICULAR EJECTION FRACTION  Technique:  Resting myocardial SPECT imaging was initially performed after intravenous administration of radiopharmaceutical. Myocardial SPECT was subsequently performed after additional radiopharmaceutical injection during pharmacologic-stress (Lexiscan)supervised by the Cardiology staff.  Quantitative gated imaging was also performed to evaluate left ventricular wall motion, and estimate left ventricular ejection fraction.  Radiopharmaceutical:  10 mCi Tc-15m sestamibi at rest and 30 mCi during stress.  Comparison: Chest radiographs 03/05/2013.  Chest CT 11/06/2012.  Findings:  SPECT images demonstrate normal left ventricular activity aside from a small fixed perfusion defect involving the inferolateral apex.  There are no reversible perfusion defects.  Gated cine images were reviewed on the workstation and demonstrate global hypokinesis.  No focal wall motion abnormalities identified at the apex.   The QGS ejection fraction measured at rest is 34% with an end diastolic volume of 84 ml and an end-systolic volume of 55 ml.  IMPRESSION:  1.  Global hypokinesis with calculated left ventricular ejection fraction of 34%. 2.  Small fixed perfusion defects at the inferolateral apex is without focal wall motion abnormality and may reflect apical thinning as opposed to focal scar. 3.  No evidence of pharmacologically induced myocardial ischemia.   Original Report Authenticated By: Carey Bullocks, M.D.    Scheduled Meds: .  amiodarone  200 mg Oral Daily  . aspirin EC  325 mg Oral Daily  . digoxin  0.125 mg Oral Daily  . docusate sodium  100 mg Oral Daily  . furosemide  40 mg Oral Daily  . lisinopril  2.5 mg Oral Daily  . metoprolol  50 mg Oral BID  . mirtazapine  15 mg Oral QHS  . sodium chloride  3 mL Intravenous Q12H  . sodium chloride  3 mL Intravenous Q12H  . warfarin  5 mg Oral ONCE-1800  . warfarin   Does not apply Once  . Warfarin - Pharmacist Dosing Inpatient   Does not apply [phone]  Continuous Infusions: . heparin 1,000 Units/hr (03/06/13 1955)      Time spent: 25 minutes    [facility]  [facility] Pager [phone]. If 7PM-7AM, please contact night-coverage at [email or web address], password [facility] 03/07/2013, 10:13 AM  LOS: 3 days

## 2013-03-07 NOTE — Progress Notes (Signed)
Shift event: RN paged this NP secondary to pt's HR being accelerated into the 140s at times associated more with activity and anxiety. Pt known to have Afib with RVR. Pt c/o some chest pain when HR that high. This NP reviewed notes from today, including cardiology. Her Metoprolol was increased some today with goal HR less than 110, preferably, less than 100. Digoxin was started 2 days ago and pt is on Amiodorone po for chronic Afib. Cardizem not being used per cardio (related to EF?). Re: chest pain, her myocardial perfusion study was negative for ischemia and there have been no changes on recent repeated EKGs. Also, [facility]VS saw and performed study and said "doubt" ACS given results of that study.  I will add metoprolol IV prn tonight and if BP holds, may be able to titrate Metoprolol po tomorrow and/or increase Digoxin. Will cont to follow.  Jimmye Norman, NP [facility]

## 2013-03-07 NOTE — Progress Notes (Signed)
Pt. Experienced increasing heart rate and is symptomatic with chest pain.  EKG performed, VS obtained, and MD paged.  Will continue to monitor.   Thane Edu, RN

## 2013-03-07 NOTE — Progress Notes (Signed)
PHARMACY FOLLOW UP NOTE  Pharmacy Consult for : Coumadin with Heparin bridging  Indication:  DVT  Dosing Weight: 61 kg  Labs:  Recent Labs  03/05/13 0340 03/05/13 1354 03/06/13 0415 03/07/13 0415  HGB 12.7  --  12.0 13.4  HCT 38.7  --  36.4 40.7  PLT 261  --  258 308  LABPROT 15.4*  --  15.9* 14.9  INR 1.25  --  1.30 1.20  HEPARINUNFRC 0.57 0.36 0.51 0.35  CREATININE 1.18*  --  0.99  --    Lab Results  Component Value Date   INR 1.20 03/07/2013   INR 1.30 03/06/2013   INR 1.25 03/05/2013    Medications:  Scheduled:  . amiodarone  200 mg Oral Daily  . aspirin EC  325 mg Oral Daily  . digoxin  0.125 mg Oral Daily  . docusate sodium  100 mg Oral Daily  . furosemide  40 mg Oral Daily  . lisinopril  2.5 mg Oral Daily  . metoprolol  50 mg Oral BID  . mirtazapine  15 mg Oral QHS  . sodium chloride  3 mL Intravenous Q12H  . sodium chloride  3 mL Intravenous Q12H  . warfarin  5 mg Oral ONCE-1800  . warfarin   Does not apply Once  . Warfarin - Pharmacist Dosing Inpatient   Does not apply q1800   Infusions:  . heparin 1,000 Units/hr (03/06/13 1955)   Assessment:  79 y/o female on Coumadin with Heparin bridging for newly diagnosed LLE DVT.  She has been on Coumadin in the past but was not taking prior to this admission due to an intramural hematoma in the aorta.  Per [facility] records, the Coumadin 5 mg dose ordered 7/24 was not charted as being given.  Today's INR is down, reflecting this.  Heparin rate is 1000 units/hr.  Heparin level is at lower end of the therapeutic range. No bleeding complications noted   Goal of Therapy:  INR 2-3  Heparin level 0.3-0.7 units/ml   Plan:  Increase Heparin to 1200 units/hour.  Will check Heparin Level in 8 hours   Coumadin 6 mg po today.  Daily Heparin level, INR, CBC.    Truman Aceituno, Deetta Perla.D 03/07/2013, 1:12 PM

## 2013-03-08 DIAGNOSIS — I7101 Dissection of thoracic aorta: Secondary | ICD-10-CM

## 2013-03-08 DIAGNOSIS — I428 Other cardiomyopathies: Secondary | ICD-10-CM

## 2013-03-08 LAB — BASIC METABOLIC PANEL
BUN: 19 mg/dL (ref 6–23)
CO2: 26 mEq/L (ref 19–32)
Calcium: 8.7 mg/dL (ref 8.4–10.5)
Chloride: 104 mEq/L (ref 96–112)
Creatinine, Ser: 1.07 mg/dL (ref 0.50–1.10)
GFR calc Af Amer: 54 mL/min — ABNORMAL LOW (ref >90)
GFR calc non Af Amer: 47 mL/min — ABNORMAL LOW (ref >90)
Glucose, Bld: 119 mg/dL — ABNORMAL HIGH (ref 70–99)
Potassium: 3.4 mEq/L — ABNORMAL LOW (ref 3.5–5.1)
Sodium: 140 mEq/L (ref 135–145)

## 2013-03-08 LAB — PROTIME-INR
INR: 1.38 (ref 0.00–1.49)
Prothrombin Time: 16.6 seconds — ABNORMAL HIGH (ref 11.6–15.2)

## 2013-03-08 LAB — HEPARIN LEVEL (UNFRACTIONATED): Heparin Unfractionated: 0.5 IU/mL (ref 0.30–0.70)

## 2013-03-08 MED ORDER — POTASSIUM CHLORIDE CRYS ER 20 MEQ PO TBCR
40.0000 meq | EXTENDED_RELEASE_TABLET | Freq: Once | ORAL | Status: AC
  Administered 2013-03-08: 40 meq via ORAL
  Filled 2013-03-08: qty 2

## 2013-03-08 MED ORDER — POTASSIUM CHLORIDE CRYS ER 20 MEQ PO TBCR
20.0000 meq | EXTENDED_RELEASE_TABLET | Freq: Every day | ORAL | Status: DC
  Administered 2013-03-08 – 2013-03-10 (×3): 20 meq via ORAL
  Filled 2013-03-08 (×3): qty 1

## 2013-03-08 MED ORDER — WARFARIN SODIUM 5 MG PO TABS
5.0000 mg | ORAL_TABLET | Freq: Once | ORAL | Status: AC
  Administered 2013-03-08: 5 mg via ORAL
  Filled 2013-03-08: qty 1

## 2013-03-08 NOTE — Progress Notes (Signed)
  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 and medications are being adjusted BUN creatinine back to normal--will get CTA of thoracic aorta to evaluate type A dissection repair Patient being anticoagulated for new diagnosis of DVT  Objective: Vital signs in last 24 hours: Temp:  [97.5 F (36.4 C)-98.9 F (37.2 C)] 97.5 F (36.4 C) (07/26 1352) Pulse Rate:  [101-115] 101 (07/26 1352) Cardiac Rhythm:  [-] Atrial fibrillation (07/26 0900) Resp:  [16-18] 16 (07/26 1352) BP: (106-119)/(76-81) 106/76 mmHg (07/26 1352) SpO2:  [95 %-97 %] 95 % (07/26 1352) Weight:  [132 lb 7.9 oz (60.1 kg)] 132 lb 7.9 oz (60.1 kg) (07/26 0421)  Hemodynamic parameters for last 24 hours:   rate controlled atrial fibrillation on 50 mg twice a day of metoprolol  Intake/Output from previous day: 07/25 0701 - 07/26 0700 In: 969.5 [P.O.:240; I.V.:729.5] Out: 200 [Urine:200] Intake/Output this shift: Total I/O In: 123 [P.O.:120; I.V.:3] Out: -   No murmur lungs clear Mild edema  Lab Results:  Recent Labs  03/06/13 0415 03/07/13 0415  WBC 7.2 8.1  HGB 12.0 13.4  HCT 36.4 40.7  PLT 258 308   BMET:  Recent Labs  03/06/13 0415 03/06/13 2110 03/08/13 0425  NA 142  --  140  K 3.0* 4.2 3.4*  CL 105  --  104  CO2 25  --  26  GLUCOSE 97  --  119*  BUN 20  --  19  CREATININE 0.99  --  1.07  CALCIUM 8.4  --  8.7    PT/INR:  Recent Labs  03/08/13 0425  LABPROT 16.6*  INR 1.38   ABG    Component Value Date/Time   PHART 7.434 11/13/2012 0554   HCO3 26.4* 11/13/2012 0554   TCO2 28 11/13/2012 0554   ACIDBASEDEF 2.0 11/12/2012 0743   O2SAT 94.0 11/13/2012 0554   CBG (last 3)  No results found for this basename: GLUCAP,  in the last 72  hours  Assessment/Plan: S/P   Assess thoracic aortic repair with CTA now creatinine is normalized   LOS: 4 days    VAN TRIGT III,Nader Boys 03/08/2013

## 2013-03-08 NOTE — Progress Notes (Signed)
[facility] PROGRESS NOTE  [name] GEX:[id number] DOB: 01-Dec-1927 DOA: 03/04/2013 PCP: Emeterio Reeve, MD  HPI:  Patient admitted  for acute CHF exacerbation.    Assessment/Plan:   CHF exacerbation Monitor on tele.  She was started on IV lasix q 12hrt ransitioned to oral lasix.  Repeat 2D echo showed global decreased LV function, . Last echo from march with normal EF. Cardiology consult from Dr Anne Fu requested and appreciate recommendations Monitor strict I/O and daily weights. I/O last 3 completed shifts: In: 1089.5 [P.O.:360; I.V.:729.5] Out: 202 [Urine:202] Total I/O In: 3 [I.V.:3] Out: -  Myo view showed 1. Global hypokinesis with calculated left ventricular ejection fraction of 34%. 2. Small fixed perfusion defects at the inferolateral apex is without focal wall motion abnormality and may reflect apical thinning as opposed to focal scar. 3. No evidence of pharmacologically induced myocardial ischemia      Afib  rate not well controlled. Metorpolol increased to 50 mg bid. continue amiodarone. 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 Continue ASA and coumadin. IV heparin for bridge. INR still sub therapeutic.    Recent ascending aortic dissection repair Sx in march. Follows with Dr Zenaida Niece tright as outpt. Appreciate Dr Donata Clay recommendations.    AKI  mild.improving. . Will monitor  New left soleal DVT: on IV heparin and po coumadin. INR SUBtherapeutic.   Hypokalemia: repleted as needed.   DVT prophylaxis.     Code Status: full Family Communication: none Disposition Plan:pending PT eval.    Consultants:  Dr Anne Fu [facility] cards)  Cardio thoracic from DR Donata Clay  Procedures:  none  Antibiotics:  none  HPI/Subjective: Comfortable. Reported that she is getting bored and nobody comes to talk to her. Denies any chest pain or sob.  She worked well with PT.   Objective: Filed Vitals:   03/07/13  0102 03/07/13 1349 03/07/13 2038 03/08/13 0421  BP:  96/64 108/81 119/80  Pulse: 124 93 103 115  Temp:  98.2 F (36.8 C) 98.9 F (37.2 C) 98.5 F (36.9 C)  TempSrc:  Oral Oral Oral  Resp:  16 18 18   Height:      Weight:    60.1 kg (132 lb 7.9 oz)  SpO2:  94% 96% 97%    Intake/Output Summary (Last 24 hours) at 03/08/13 1153 Last data filed at 03/08/13 1014  Gross per 24 hour  Intake 972.47 ml  Output    200 ml  Net 772.47 ml   Filed Weights   03/05/13 0450 03/06/13 0414 03/08/13 0421  Weight: 61.462 kg (135 lb 8 oz) 61.417 kg (135 lb 6.4 oz) 60.1 kg (132 lb 7.9 oz)    Exam:   General:  Elderly female comfortable.   HEENT: no pallor, no JVD  Chest:CTAB   CVS: S1&S2 irregular TACHYCARDIC.  Abd: soft, NT, ND, BS+  Ext: 1 + edema B/L      Data Reviewed: Basic Metabolic Panel:  Recent Labs Lab 03/04/13 0109 03/04/13 0735 03/05/13 0340 03/05/13 2202 03/06/13 0415 03/06/13 2110 03/08/13 0425  NA 141 143 145  --  142  --  140  K 3.9 3.7 2.9* 3.6 3.0* 4.2 3.4*  CL 107 105 104  --  105  --  104  CO2 25 26 28   --  25  --  26  GLUCOSE 157* 122* 100*  --  97  --  119*  BUN 34* 32* 25*  --  20  --  19  CREATININE 1.20* 1.23* 1.18*  --  0.99  --  1.07  CALCIUM 8.6 8.9 8.8  --  8.4  --  8.7  MG  --   --  1.6  --  1.6  --   --    Liver Function Tests:  Recent Labs Lab 03/04/13 0735  AST 34  ALT 40*  ALKPHOS 113  BILITOT 0.6  PROT 6.4  ALBUMIN 3.1*   No results found for this basename: LIPASE, AMYLASE,  in the last 168 hours No results found for this basename: AMMONIA,  in the last 168 hours CBC:  Recent Labs Lab 03/04/13 0109 03/04/13 0735 03/05/13 0340 03/06/13 0415 03/07/13 0415  WBC 7.2 7.0 8.0 7.2 8.1  NEUTROABS 5.0 4.1  --   --   --   HGB 12.8 12.9 12.7 12.0 13.4  HCT 38.6 39.9 38.7 36.4 40.7  MCV 88.3 88.7 87.8 86.9 87.3  PLT 253 239 261 258 308   Cardiac Enzymes:  Recent Labs Lab 03/04/13 0109 03/04/13 0735 03/04/13 1250  03/04/13 1626  TROPONINI <0.30 <0.30 <0.30 <0.30   BNP (last 3 results)  Recent Labs  03/04/13 0109 03/06/13 0415  PROBNP 6122.0* 5114.0*   CBG: No results found for this basename: GLUCAP,  in the last 168 hours  No results found for this or any previous visit (from the past 240 hour(s)).   Studies: No results found.  Scheduled Meds: . amiodarone  200 mg Oral Daily  . aspirin EC  325 mg Oral Daily  . digoxin  0.125 mg Oral Daily  . docusate sodium  100 mg Oral Daily  . furosemide  40 mg Oral Daily  . lisinopril  2.5 mg Oral Daily  . metoprolol  50 mg Oral BID  . mirtazapine  15 mg Oral QHS  . potassium chloride  20 mEq Oral Daily  . sodium chloride  3 mL Intravenous Q12H  . sodium chloride  3 mL Intravenous Q12H  . warfarin   Does not apply Once  . Warfarin - Pharmacist Dosing Inpatient   Does not apply q1800   Continuous Infusions: . heparin 1,200 Units/hr (03/07/13 1401)      Time spent: 25 minutes    [name]  [facility] Pager [phone]. If 7PM-7AM, please contact night-coverage at [email or web address], password [facility] 03/08/2013, 11:53 AM  LOS: 4 days

## 2013-03-08 NOTE — Progress Notes (Signed)
Rn spoke with patients daughter Arme concerning patient expressing concerns of inability to understand medications. RN expressed concerns to MD Akula. Per Dr.Akula RM to contact physician when adult daughter present at bedside. Rn recommended daughter speak to Case Manager prior to discharge for possible [facility] for medication management.   Niobe Dick,RN,BSN

## 2013-03-08 NOTE — Progress Notes (Signed)
PHARMACY FOLLOW UP NOTE  Pharmacy Consult for : Coumadin with Heparin bridging  Indication:  DVT  Dosing Weight: 61 kg  Labs:  Recent Labs  03/06/13 0415 03/07/13 0415 03/07/13 2245 03/08/13 0425  HGB 12.0 13.4  --   --   HCT 36.4 40.7  --   --   PLT 258 308  --   --   LABPROT 15.9* 14.9  --  16.6*  INR 1.30 1.20  --  1.38  HEPARINUNFRC 0.51 0.35 0.53 0.50  CREATININE 0.99  --   --  1.07   Lab Results  Component Value Date   INR 1.38 03/08/2013   INR 1.20 03/07/2013   INR 1.30 03/06/2013   Medications:  Scheduled:  . amiodarone  200 mg Oral Daily  . aspirin EC  325 mg Oral Daily  . digoxin  0.125 mg Oral Daily  . docusate sodium  100 mg Oral Daily  . furosemide  40 mg Oral Daily  . lisinopril  2.5 mg Oral Daily  . metoprolol  50 mg Oral BID  . mirtazapine  15 mg Oral QHS  . potassium chloride  20 mEq Oral Daily  . sodium chloride  3 mL Intravenous Q12H  . sodium chloride  3 mL Intravenous Q12H  . warfarin   Does not apply Once  . Warfarin - Pharmacist Dosing Inpatient   Does not apply q1800   Infusions:  . heparin 1,200 Units/hr (03/07/13 1401)   Assessment:  79 y/o female on Coumadin with Heparin bridging for newly diagnosed LLE DVT.  She has been on Coumadin in the past but was not taking prior to this admission due to an intramural hematoma in the aorta.  Heparin rate is 1200 units/hr.  Heparin level is now within desired goal range at 0.50.  She has no bleeding complications noted.  INR = 1.38  Goal of Therapy:  INR 2-3  Heparin level 0.3-0.7 units/ml   Plan:  Will continue IV Heparin at 1200 units/hour.   Daily Heparin level, INR, CBC.    Warfarin 5mg  po x 1 dose  Celedonio Miyamoto, PharmD, BCPS Clinical Pharmacist Pager [phone]  Thank you for allowing pharmacy to be part of this patients care team. 03/08/2013, 2:08 PM

## 2013-03-08 NOTE — Progress Notes (Signed)
PHARMACY FOLLOW UP NOTE  Pharmacy Consult for : Coumadin with Heparin bridging  Indication:  DVT  Dosing Weight: 61 kg  Labs:  Recent Labs  03/05/13 0340  03/06/13 0415 03/07/13 0415 03/07/13 2245  HGB 12.7  --  12.0 13.4  --   HCT 38.7  --  36.4 40.7  --   PLT 261  --  258 308  --   LABPROT 15.4*  --  15.9* 14.9  --   INR 1.25  --  1.30 1.20  --   HEPARINUNFRC 0.57  < > 0.51 0.35 0.53  CREATININE 1.18*  --  0.99  --   --   < > = values in this interval not displayed. Lab Results  Component Value Date   INR 1.20 03/07/2013   INR 1.30 03/06/2013   INR 1.25 03/05/2013   Medications:  Scheduled:  . amiodarone  200 mg Oral Daily  . aspirin EC  325 mg Oral Daily  . digoxin  0.125 mg Oral Daily  . docusate sodium  100 mg Oral Daily  . furosemide  40 mg Oral Daily  . lisinopril  2.5 mg Oral Daily  . metoprolol  50 mg Oral BID  . mirtazapine  15 mg Oral QHS  . sodium chloride  3 mL Intravenous Q12H  . sodium chloride  3 mL Intravenous Q12H  . warfarin   Does not apply Once  . Warfarin - Pharmacist Dosing Inpatient   Does not apply q1800   Infusions:  . heparin 1,200 Units/hr (03/07/13 1401)   Assessment:  79 y/o female on Coumadin with Heparin bridging for newly diagnosed LLE DVT.  She has been on Coumadin in the past but was not taking prior to this admission due to an intramural hematoma in the aorta.  Heparin rate is 1200 units/hr.  Heparin level is now within desired goal range at 0.53.  She has no bleeding complications noted   Goal of Therapy:  INR 2-3  Heparin level 0.3-0.7 units/ml   Plan:  Will continue IV Heparin at 1200 units/hour.   Daily Heparin level, INR, CBC.    Nadara Mustard, PharmD., MS Clinical Pharmacist Pager:  [phone] Thank you for allowing pharmacy to be part of this patients care team. 03/08/2013, 12:02 AM

## 2013-03-08 NOTE — Progress Notes (Signed)
Patient ID: [name], female   DOB: 03-07-1928, 79 y.o.   MRN: [id number]   Patient Name: [name] Date of Encounter: 03/08/2013    SUBJECTIVE  Sitting up in chair. Less short of breath. Anxious to go home. Misses  Family.  CURRENT MEDS . amiodarone  200 mg Oral Daily  . aspirin EC  325 mg Oral Daily  . digoxin  0.125 mg Oral Daily  . docusate sodium  100 mg Oral Daily  . furosemide  40 mg Oral Daily  . lisinopril  2.5 mg Oral Daily  . metoprolol  50 mg Oral BID  . mirtazapine  15 mg Oral QHS  . sodium chloride  3 mL Intravenous Q12H  . sodium chloride  3 mL Intravenous Q12H  . warfarin   Does not apply Once  . Warfarin - Pharmacist Dosing Inpatient   Does not apply q1800    OBJECTIVE  Filed Vitals:   03/07/13 0937 03/07/13 1349 03/07/13 2038 03/08/13 0421  BP:  96/64 108/81 119/80  Pulse: 124 93 103 115  Temp:  98.2 F (36.8 C) 98.9 F (37.2 C) 98.5 F (36.9 C)  TempSrc:  Oral Oral Oral  Resp:  16 18 18   Height:      Weight:    132 lb 7.9 oz (60.1 kg)  SpO2:  94% 96% 97%    Intake/Output Summary (Last 24 hours) at 03/08/13 0901 Last data filed at 03/08/13 0700  Gross per 24 hour  Intake 969.47 ml  Output    200 ml  Net 769.47 ml   Filed Weights   03/05/13 0450 03/06/13 0414 03/08/13 0421  Weight: 135 lb 8 oz (61.462 kg) 135 lb 6.4 oz (61.417 kg) 132 lb 7.9 oz (60.1 kg)    PHYSICAL EXAM  General: Pleasant, NAD. Chronically ill-appearing Neuro: Alert and oriented X 3. Moves all extremities spontaneously. Psych: Normal affect. HEENT:  Normal  Neck: Supple without bruits or JVD. Lungs:  Resp regular and unlabored, CTA. Heart: Regular rate and rhythm no s3, s4, or murmurs. Abdomen: Soft, non-tender, non-distended, BS + x 4.  Extremities: No clubbing, cyanosis or edema. DP/PT/Radials 2+ and equal bilaterally.  Accessory Clinical Findings  CBC  Recent Labs  03/06/13 0415 03/07/13 0415  WBC 7.2 8.1  HGB 12.0 13.4  HCT 36.4 40.7  MCV 86.9 87.3    PLT 258 308   Basic Metabolic Panel  Recent Labs  03/06/13 0415 03/06/13 2110 03/08/13 0425  NA 142  --  140  K 3.0* 4.2 3.4*  CL 105  --  104  CO2 25  --  26  GLUCOSE 97  --  119*  BUN 20  --  19  CREATININE 0.99  --  1.07  CALCIUM 8.4  --  8.7  MG 1.6  --   --    Liver Function Tests No results found for this basename: AST, ALT, ALKPHOS, BILITOT, PROT, ALBUMIN,  in the last 72 hours No results found for this basename: LIPASE, AMYLASE,  in the last 72 hours Cardiac Enzymes No results found for this basename: CKTOTAL, CKMB, CKMBINDEX, TROPONINI,  in the last 72 hours BNP No components found with this basename: POCBNP,  D-Dimer No results found for this basename: DDIMER,  in the last 72 hours Hemoglobin A1C No results found for this basename: HGBA1C,  in the last 72 hours Fasting Lipid Panel No results found for this basename: CHOL, HDL, LDLCALC, TRIG, CHOLHDL, LDLDIRECT,  in the last 72 hours  Thyroid Function Tests No results found for this basename: TSH, T4TOTAL, FREET3, T3FREE, THYROIDAB,  in the last 72 hours  TELE  A. fib  ECG    Radiology/Studies  Dg Chest 2 View  03/05/2013   *RADIOLOGY REPORT*  Clinical Data: Congestive heart failure.  CHEST - 2 VIEW  Comparison: 03/04/2013 and 01/01/2013  Findings: Cardiomegaly and tortuosity and dilatation of the thoracic aorta is unchanged.  Pulmonary vascularity is within normal limits.  There are tiny bilateral effusions, unchanged. There is focal atelectasis at the left lung base posteriorly and in the lingula, slightly increased.  Aeration at the right base has improved.  IMPRESSION: Interval improvement in pulmonary vascularity and aeration at the right lung base.  Slight increased focal atelectasis at the left base.   Original Report Authenticated By: Francene Boyers, M.D.   Dg Chest 2 View  03/04/2013   *RADIOLOGY REPORT*  Clinical Data: Shortness of breath, dizziness, chest pain.  CHEST - 2 VIEW  Comparison:  01/01/2013  Findings: Stable appearance of postoperative change in the mediastinum.  Shallow inspiration.  Cardiac enlargement with dilated and tortuous thoracic aorta.  Pulmonary vascularity is mildly increased.  No significant edema.  Atelectasis in the lung bases is new since previous study.  Small bilateral pleural effusions.  No pneumothorax.  Probable esophageal hiatal hernia behind the heart.  IMPRESSION: Cardiac enlargement with thoracic aortic dilatation.  Increasing pulmonary vascular congestion with small bilateral pleural effusions and basilar atelectasis new since previous study. Probable esophageal hiatal hernia behind the heart.   Original Report Authenticated By: Burman Nieves, M.D.   Nm Myocar Multi W/spect W/Armenta Erskin Motion / Ef  03/05/2013   *RADIOLOGY REPORT*  Clinical Data:  History of atrial fibrillation, hypertension, congestive heart failure and aortic root aneurysm.  MYOCARDIAL IMAGING WITH SPECT (REST AND PHARMACOLOGIC-STRESS) GATED LEFT VENTRICULAR [place] MOTION STUDY LEFT VENTRICULAR EJECTION FRACTION  Technique:  Resting myocardial SPECT imaging was initially performed after intravenous administration of radiopharmaceutical. Myocardial SPECT was subsequently performed after additional radiopharmaceutical injection during pharmacologic-stress (Lexiscan)supervised by the Cardiology staff.  Quantitative gated imaging was also performed to evaluate left ventricular Keriana Sarsfield motion, and estimate left ventricular ejection fraction.  Radiopharmaceutical:  10 mCi Tc-83m sestamibi at rest and 30 mCi during stress.  Comparison: Chest radiographs 03/05/2013.  Chest CT 11/06/2012.  Findings:  SPECT images demonstrate normal left ventricular activity aside from a small fixed perfusion defect involving the inferolateral apex.  There are no reversible perfusion defects.  Gated cine images were reviewed on the workstation and demonstrate global hypokinesis.  No focal Zakariyya Helfman motion abnormalities identified at  the apex.   The QGS ejection fraction measured at rest is 34% with an end diastolic volume of 84 ml and an end-systolic volume of 55 ml.  IMPRESSION:  1.  Global hypokinesis with calculated left ventricular ejection fraction of 34%. 2.  Small fixed perfusion defects at the inferolateral apex is without focal Aishi Courts motion abnormality and may reflect apical thinning as opposed to focal scar. 3.  No evidence of pharmacologically induced myocardial ischemia.   Original Report Authenticated By: Carey Bullocks, M.D.    ASSESSMENT AND PLAN  Principal Problem:   CHF, acute Active Problems:   Hypertension   Aortic aneurysm with dissection   Renal insufficiency   Atrial fibrillation    Improving. Ventricular rate is currently less than 100 sitting in the chair. Eyes nose and complete the weight is down to 130 to 135 2 days ago. Coumadin not yet therapeutic. Being managed by  pharmacy. BUN/creatinine slightly increased. We'll make no changes in current dose of Lasix. Reassess in the morning. Potassium down again. We'll start daily dose of 20 mEq.  Signed, Valera Castle MD

## 2013-03-08 NOTE — Progress Notes (Signed)
Pt walked with RN in the hallway 171ft. V/S stable. Pt encouraged to use incentive spirometer. Will continue to monitor.   Kathrina Crosley,RN,BSN

## 2013-03-09 ENCOUNTER — Inpatient Hospital Stay (HOSPITAL_COMMUNITY): Payer: Medicare PPO

## 2013-03-09 LAB — HEPARIN LEVEL (UNFRACTIONATED): Heparin Unfractionated: 0.51 IU/mL (ref 0.30–0.70)

## 2013-03-09 LAB — BASIC METABOLIC PANEL
BUN: 17 mg/dL (ref 6–23)
CO2: 25 mEq/L (ref 19–32)
Calcium: 8.8 mg/dL (ref 8.4–10.5)
Chloride: 102 mEq/L (ref 96–112)
Creatinine, Ser: 1.02 mg/dL (ref 0.50–1.10)
GFR calc Af Amer: 57 mL/min — ABNORMAL LOW (ref >90)
GFR calc non Af Amer: 49 mL/min — ABNORMAL LOW (ref >90)
Glucose, Bld: 110 mg/dL — ABNORMAL HIGH (ref 70–99)
Potassium: 4.2 mEq/L (ref 3.5–5.1)
Sodium: 136 mEq/L (ref 135–145)

## 2013-03-09 LAB — PROTIME-INR
INR: 1.69 — ABNORMAL HIGH (ref 0.00–1.49)
Prothrombin Time: 19.4 seconds — ABNORMAL HIGH (ref 11.6–15.2)

## 2013-03-09 MED ORDER — IOHEXOL 350 MG/ML SOLN
100.0000 mL | Freq: Once | INTRAVENOUS | Status: AC | PRN
  Administered 2013-03-09: 100 mL via INTRAVENOUS

## 2013-03-09 MED ORDER — WARFARIN SODIUM 5 MG PO TABS
5.0000 mg | ORAL_TABLET | Freq: Once | ORAL | Status: AC
  Administered 2013-03-09: 5 mg via ORAL
  Filled 2013-03-09: qty 1

## 2013-03-09 NOTE — Progress Notes (Signed)
[facility] PROGRESS NOTE  Nishtha Raider ION:[id number] DOB: 10/05/1927 DOA: 03/04/2013 PCP: Emeterio Reeve, MD  HPI:  Patient admitted  for acute CHF exacerbation.    Assessment/Plan:   CHF exacerbation Monitor on tele.  She was started on IV lasix q 12hrt ransitioned to oral lasix.  Repeat 2D echo showed global decreased LV function, . Last echo from march with normal EF. Cardiology consult from Dr Anne Fu requested and appreciate recommendations Monitor strict I/O and daily weights. I/O last 3 completed shifts: In: 1212.5 [P.O.:480; I.V.:732.5] Out: 1102 [Urine:1102] Total I/O In: 120 [P.O.:120] Out: -  Myo view showed 1. Global hypokinesis with calculated left ventricular ejection fraction of 34%. 2. Small fixed perfusion defects at the inferolateral apex is without focal wall motion abnormality and may reflect apical thinning as opposed to focal scar. 3. No evidence of pharmacologically induced myocardial ischemia      Afib  rate not well controlled. Metorpolol increased to 50 mg bid. continue amiodarone. 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 Continue ASA and coumadin. IV heparin for bridge. INR still sub therapeutic.    Recent ascending aortic dissection repair Sx in march. Follows with Dr Zenaida Niece tright as outpt. Appreciate Dr Donata Clay recommendations.    AKI  mild.improving. . Will monitor  New left soleal DVT: on IV heparin and po coumadin. INR SUBtherapeutic.   Hypokalemia: repleted as needed.   DVT prophylaxis.     Code Status: full Family Communication: none Disposition Plan:home possibly tomorrow.    Consultants:  Dr Anne Fu [facility] cards)  Cardio thoracic from DR Donata Clay  Procedures:  none  Antibiotics:  none  HPI/Subjective: Comfortable.  She worked well with PT.   Objective: Filed Vitals:   03/08/13 0421 03/08/13 1352 03/08/13 2009 03/09/13 0539  BP: 119/80 106/76 108/76 115/79   Pulse: 115 101 83 103  Temp: 98.5 F (36.9 C) 97.5 F (36.4 C) 97.6 F (36.4 C) 97.9 F (36.6 C)  TempSrc: Oral Oral Oral Oral  Resp: 18 16 18 18   Height:      Weight: 60.1 kg (132 lb 7.9 oz)   59.966 kg (132 lb 3.2 oz)  SpO2: 97% 95% 93% 98%    Intake/Output Summary (Last 24 hours) at 03/09/13 1017 Last data filed at 03/09/13 0900  Gross per 24 hour  Intake    480 ml  Output    902 ml  Net   -422 ml   Filed Weights   03/06/13 0414 03/08/13 0421 03/09/13 0539  Weight: 61.417 kg (135 lb 6.4 oz) 60.1 kg (132 lb 7.9 oz) 59.966 kg (132 lb 3.2 oz)    Exam:   General:  Elderly female comfortable.   HEENT: no pallor, no JVD  Chest:CTAB   CVS: S1&S2 irregular TACHYCARDIC.  Abd: soft, NT, ND, BS+  Ext: 1 + edema B/L      Data Reviewed: Basic Metabolic Panel:  Recent Labs Lab 03/04/13 0735 03/05/13 0340 03/05/13 2202 03/06/13 0415 03/06/13 2110 03/08/13 0425 03/09/13 0510  NA 143 145  --  142  --  140 136  K 3.7 2.9* 3.6 3.0* 4.2 3.4* 4.2  CL 105 104  --  105  --  104 102  CO2 26 28  --  25  --  26 25  GLUCOSE 122* 100*  --  97  --  119* 110*  BUN 32* 25*  --  20  --  19 17  CREATININE  1.23* 1.18*  --  0.99  --  1.07 1.02  CALCIUM 8.9 8.8  --  8.4  --  8.7 8.8  MG  --  1.6  --  1.6  --   --   --    Liver Function Tests:  Recent Labs Lab 03/04/13 0735  AST 34  ALT 40*  ALKPHOS 113  BILITOT 0.6  PROT 6.4  ALBUMIN 3.1*   No results found for this basename: LIPASE, AMYLASE,  in the last 168 hours No results found for this basename: AMMONIA,  in the last 168 hours CBC:  Recent Labs Lab 03/04/13 0109 03/04/13 0735 03/05/13 0340 03/06/13 0415 03/07/13 0415  WBC 7.2 7.0 8.0 7.2 8.1  NEUTROABS 5.0 4.1  --   --   --   HGB 12.8 12.9 12.7 12.0 13.4  HCT 38.6 39.9 38.7 36.4 40.7  MCV 88.3 88.7 87.8 86.9 87.3  PLT 253 239 261 258 308   Cardiac Enzymes:  Recent Labs Lab 03/04/13 0109 03/04/13 0735 03/04/13 1250 03/04/13 1626  TROPONINI  <0.30 <0.30 <0.30 <0.30   BNP (last 3 results)  Recent Labs  03/04/13 0109 03/06/13 0415  PROBNP 6122.0* 5114.0*   CBG: No results found for this basename: GLUCAP,  in the last 168 hours  No results found for this or any previous visit (from the past 240 hour(s)).   Studies: Ct Angio Chest Pe W/cm &/or Wo Cm  03/09/2013   *RADIOLOGY REPORT*  Clinical Data: History of type A dissection repair.  Heart failure symptoms 6 months after repair.  Abnormal echocardiogram showing significant aortic insufficiency or pericardial effusion.  CT ANGIOGRAPHY CHEST  Technique:  Multidetector CT imaging of the chest using the standard protocol during bolus administration of intravenous contrast. Multiplanar reconstructed images including MIPs were obtained and reviewed to evaluate the vascular anatomy.  Contrast: OMNIPAQUE IOHEXOL 350 MG/ML SOLN  Comparison: None.  Findings: Initial contrast injection was performed with bolus timing for visualization of pulmonary arteries.  The patient was then reinjected with decreased dose with bolus timing for visualization of the thoracic aorta.  No filling defects demonstrated in the pulmonary arteries.  No evidence of pulmonary embolus.  There is reflux of contrast material in the inferior vena cava and hepatic veins suggesting passive hepatic congestion.  Postoperative changes in the mediastinum.  Vascular graft in the ascending thoracic aorta appears patent.  Great vessels are patent and tortuous.  No evidence of dissection.  There is dilatation of the aortic arch with diameter measured at up to about 3.8 cm. Proximal descending thoracic aorta measures up to about 5.1 x 5.8 cm diameter.  There is mural thrombus beginning at the distal aortic arch and extending down the descending thoracic aorta. There is heterogeneous mixing of contrast material within the opacified lumen beginning in the arch.  Focal thrombosis, flow turbulence, or dissection could have this  appearance.  There is a small pleural effusion on the left with fluid around the lateral margin of the descending thoracic aorta.  No evidence of contrast extravasation.  Density measurements are consistent with non hemorrhagic fluid.  Atelectasis in the left lung base. Visualization of the lungs is limited due to respiratory motion artifact.  No pneumothorax.  Visualized portions of the upper abdominal organs demonstrate cholelithiasis.  Diffuse cardiac enlargement with four-chamber prominence.  IMPRESSION: Postoperative changes with graft in the ascending thoracic aorta. Dilated arch and descending aorta.  The mural thrombus is present in the descending aorta and the  arch.  Heterogeneous mixing of contrast material in the arch and extending in the non thrombosed lumen.  This may be due to residual dissection, thrombus, or turbulence.  Small left pleural effusion and atelectasis in the left lung base.  No definite evidence of mediastinal hematoma. Cardiac enlargement with passive reflux of contrast material into the inferior vena cava and hepatic veins.  Cholelithiasis.   Original Report Authenticated By: Burman Nieves, M.D.    Scheduled Meds: . amiodarone  200 mg Oral Daily  . aspirin EC  325 mg Oral Daily  . digoxin  0.125 mg Oral Daily  . docusate sodium  100 mg Oral Daily  . furosemide  40 mg Oral Daily  . lisinopril  2.5 mg Oral Daily  . metoprolol  50 mg Oral BID  . mirtazapine  15 mg Oral QHS  . potassium chloride  20 mEq Oral Daily  . sodium chloride  3 mL Intravenous Q12H  . sodium chloride  3 mL Intravenous Q12H  . warfarin   Does not apply Once  . Warfarin - Pharmacist Dosing Inpatient   Does not apply q1800   Continuous Infusions: . heparin 1,200 Units/hr (03/07/13 1401)      Time spent: 20 minutes    Zakiyyah Savannah  [facility] Pager [phone]. If 7PM-7AM, please contact night-coverage at [email or web address], password [facility] 03/09/2013, 10:17 AM  LOS: 5 days

## 2013-03-09 NOTE — Progress Notes (Signed)
Patient ID: [name], female   DOB: [birth year], 79 y.o.   MRN: [id number] Chart reviewed. Weight is stable. Input and output incomplete. Ventricular rate running about 90. Blood pressures also we'll not make any increase in diltiazem or metoprolol today. INR still supratherapeutic. Potassium repleted and on daily dose of 20 mEq now. No changes from cardiac perspective today.

## 2013-03-09 NOTE — Progress Notes (Signed)
ANTICOAGULATION CONSULT NOTE - Follow Up Consult  Pharmacy Consult for Heparin --> Coumadin Indication: DVT  No Known Allergies  Patient Measurements: Height: 5\' 3"  (160 cm) Weight: 132 lb 3.2 oz (59.966 kg) IBW/kg (Calculated) : 52.4 Heparin Dosing Weight: 61 kg  Vital Signs: Temp: 97.9 F (36.6 C) (07/27 0539) Temp src: Oral (07/27 0539) BP: 112/61 mmHg (07/27 1206) Pulse Rate: 85 (07/27 1206)  Labs:  Recent Labs  03/07/13 0415 03/07/13 2245 03/08/13 0425 03/09/13 0510  HGB 13.4  --   --   --   HCT 40.7  --   --   --   PLT 308  --   --   --   LABPROT 14.9  --  16.6* 19.4*  INR 1.20  --  1.38 1.69*  HEPARINUNFRC 0.35 0.53 0.50 0.51  CREATININE  --   --  1.07 1.02    Estimated Creatinine Clearance: 34.6 ml/min (by C-G formula based on Cr of 1.02).   Medications:  Infusions:  . heparin 1,200 Units/hr (03/07/13 1401)    Assessment: 79 y/o female on heparin to Coumadin bridge for newly diagnosed LLE DVT. She has been on Coumadin in the past but was not taking prior to this admission due to an intramural hematoma in the aorta. Heparin level is therapeutic at 0.51. INR is subtherapeutic at 1.69 but trending up. Appears patient missed a dose on 7/24. No bleeding noted, last CBC was normal on 7/25 (Daily CBC ordered by pharmacy was cancelled on 7/25).  Today is day 6 of heparin to Coumadin bridge. Bridge to continue a minimum of 5 days AND until INR >= 2 for 24 hours.  Goal of Therapy:  INR 2-3 Heparin level 0.3-0.7 units/ml Monitor platelets by anticoagulation protocol: Yes   Plan:  -Continue heparin at 1200 units/hr -Coumadin 5 mg PO tonight -Daily INR, CBC, heparin level -Monitor for signs/symptoms of bleeding  [facility], [address].D., BCPS Clinical Pharmacist Pager: [phone] 03/09/2013 1:23 PM

## 2013-03-09 NOTE — Progress Notes (Signed)
  Subjective: CHF from non-ischemic cardiomyopathy following repair of subacute Type A dissection with Ao valve resuspended  Echo shows no sig AI CTA of thoracic aorta shows repair intact and false lumen has thrombosed  Her symptoms have improved with lasix and rate control of a-fib   Objective: Vital signs in last 24 hours: Temp:  [97.5 F (36.4 C)-97.9 F (36.6 C)] 97.9 F (36.6 C) (07/27 0539) Pulse Rate:  [83-103] 103 (07/27 0539) Cardiac Rhythm:  [-] Atrial fibrillation (07/27 0745) Resp:  [16-18] 18 (07/27 0539) BP: (106-115)/(76-79) 115/79 mmHg (07/27 0539) SpO2:  [93 %-98 %] 98 % (07/27 0539) Weight:  [132 lb 3.2 oz (59.966 kg)] 132 lb 3.2 oz (59.966 kg) (07/27 0539)  Hemodynamic parameters for last 24 hours:  afib  Intake/Output from previous day: 07/26 0701 - 07/27 0700 In: 363 [P.O.:360; I.V.:3] Out: 902 [Urine:902] Intake/Output this shift: Total I/O In: 120 [P.O.:120] Out: -     Lab Results:  Recent Labs  03/07/13 0415  WBC 8.1  HGB 13.4  HCT 40.7  PLT 308   BMET:  Recent Labs  03/08/13 0425 03/09/13 0510  NA 140 136  K 3.4* 4.2  CL 104 102  CO2 26 25  GLUCOSE 119* 110*  BUN 19 17  CREATININE 1.07 1.02  CALCIUM 8.7 8.8    PT/INR:  Recent Labs  03/09/13 0510  LABPROT 19.4*  INR 1.69*   ABG    Component Value Date/Time   PHART 7.434 11/13/2012 0554   HCO3 26.4* 11/13/2012 0554   TCO2 28 11/13/2012 0554   ACIDBASEDEF 2.0 11/12/2012 0743   O2SAT 94.0 11/13/2012 0554   CBG (last 3)  No results found for this basename: GLUCAP,  in the last 72 hours  Assessment/Plan: S/P  repair of Type A dissection 6 months ago- intact Cont medical care of a-fib, CHF   LOS: 5 days    [name],[name] 03/09/2013

## 2013-03-10 LAB — BASIC METABOLIC PANEL
BUN: 13 mg/dL (ref 6–23)
CO2: 21 mEq/L (ref 19–32)
Calcium: 9 mg/dL (ref 8.4–10.5)
Chloride: 107 mEq/L (ref 96–112)
Creatinine, Ser: 1.05 mg/dL (ref 0.50–1.10)
GFR calc Af Amer: 55 mL/min — ABNORMAL LOW (ref >90)
GFR calc non Af Amer: 48 mL/min — ABNORMAL LOW (ref >90)
Glucose, Bld: 92 mg/dL (ref 70–99)
Potassium: 4.3 mEq/L (ref 3.5–5.1)
Sodium: 138 mEq/L (ref 135–145)

## 2013-03-10 LAB — CBC
HCT: 39.5 % (ref 36.0–46.0)
Hemoglobin: 12.3 g/dL (ref 12.0–15.0)
MCH: 27.6 pg (ref 26.0–34.0)
MCHC: 31.1 g/dL (ref 30.0–36.0)
MCV: 88.6 fL (ref 78.0–100.0)
Platelets: 277 10*3/uL (ref 150–400)
RBC: 4.46 MIL/uL (ref 3.87–5.11)
RDW: 16.2 % — ABNORMAL HIGH (ref 11.5–15.5)
WBC: 5.1 10*3/uL (ref 4.0–10.5)

## 2013-03-10 LAB — PROTIME-INR
INR: 2.1 — ABNORMAL HIGH (ref 0.00–1.49)
Prothrombin Time: 22.9 seconds — ABNORMAL HIGH (ref 11.6–15.2)

## 2013-03-10 LAB — HEPARIN LEVEL (UNFRACTIONATED): Heparin Unfractionated: 0.54 IU/mL (ref 0.30–0.70)

## 2013-03-10 MED ORDER — ENOXAPARIN SODIUM 60 MG/0.6ML ~~LOC~~ SOLN
60.0000 mg | Freq: Once | SUBCUTANEOUS | Status: DC
  Filled 2013-03-10: qty 0.6

## 2013-03-10 MED ORDER — ENOXAPARIN SODIUM 30 MG/0.3ML ~~LOC~~ SOLN: 90.0000 mg | Freq: Every day | SUBCUTANEOUS | Status: DC

## 2013-03-10 MED ORDER — WARFARIN SODIUM 5 MG PO TABS: 5.0000 mg | ORAL_TABLET | Freq: Once | ORAL | Status: DC

## 2013-03-10 MED ORDER — LISINOPRIL 2.5 MG PO TABS: 2.5000 mg | ORAL_TABLET | Freq: Every day | ORAL | Status: DC

## 2013-03-10 MED ORDER — WARFARIN SODIUM 5 MG PO TABS
5.0000 mg | ORAL_TABLET | Freq: Once | ORAL | Status: AC
  Administered 2013-03-10: 5 mg via ORAL
  Filled 2013-03-10: qty 1

## 2013-03-10 MED ORDER — METOPROLOL TARTRATE 50 MG PO TABS: 50.0000 mg | ORAL_TABLET | Freq: Two times a day (BID) | ORAL | Status: DC

## 2013-03-10 MED ORDER — POTASSIUM CHLORIDE CRYS ER 20 MEQ PO TBCR: 20.0000 meq | EXTENDED_RELEASE_TABLET | Freq: Every day | ORAL | Status: DC

## 2013-03-10 MED ORDER — FUROSEMIDE 40 MG PO TABS: 40.0000 mg | ORAL_TABLET | Freq: Every day | ORAL | Status: DC

## 2013-03-10 MED ORDER — DIGOXIN 125 MCG PO TABS: 0.1250 mg | ORAL_TABLET | Freq: Every day | ORAL | Status: DC

## 2013-03-10 MED ORDER — ENOXAPARIN SODIUM 30 MG/0.3ML ~~LOC~~ SOLN: 60.0000 mg | Freq: Two times a day (BID) | SUBCUTANEOUS | Status: DC

## 2013-03-10 MED ORDER — DSS 100 MG PO CAPS: 100.0000 mg | ORAL_CAPSULE | Freq: Every day | ORAL | Status: DC

## 2013-03-10 NOTE — Progress Notes (Signed)
Subjective:  Comfortable, sitting up in chair, no evidence of labored breathing, no chest pain.  Objective:  Vital Signs in the last 24 hours: Temp:  [97.5 F (36.4 C)-97.8 F (36.6 C)] 97.8 F (36.6 C) (07/28 0440) Pulse Rate:  [79-92] 92 (07/28 0440) Resp:  [16-20] 20 (07/28 0440) BP: (102-121)/(56-75) 121/75 mmHg (07/28 0440) SpO2:  [94 %-96 %] 96 % (07/28 0440) Weight:  [60.056 kg (132 lb 6.4 oz)] 60.056 kg (132 lb 6.4 oz) (07/28 0440)  Intake/Output from previous day: 07/27 0701 - 07/28 0700 In: 320 [P.O.:320] Out: 1400 [Urine:1400]   Physical Exam: General: Well developed, well nourished, in no acute distress. Head:  Normocephalic and atraumatic. Lungs: Clear to auscultation and percussion. Heart: Normal S1 and S2.  Soft systolic murmur, rubs or gallops.  Abdomen: soft, non-tender, positive bowel sounds. Extremities: No clubbing or cyanosis. No edema. Neurologic: Alert and oriented x 3.    Lab Results:  Recent Labs  03/10/13 0525  WBC 5.1  HGB 12.3  PLT 277    Recent Labs  03/09/13 0510 03/10/13 0525  NA 136 138  K 4.2 4.3  CL 102 107  CO2 25 21  GLUCOSE 110* 92  BUN 17 13  CREATININE 1.02 1.05   No results found for this basename: TROPONINI, CK, MB,  in the last 72 hours Hepatic Function Panel No results found for this basename: PROT, ALBUMIN, AST, ALT, ALKPHOS, BILITOT, BILIDIR, IBILI,  in the last 72 hours No results found for this basename: CHOL,  in the last 72 hours No results found for this basename: PROTIME,  in the last 72 hours  Imaging: Ct Angio Chest Pe W/cm &/or Wo Cm  03/09/2013   *RADIOLOGY REPORT*  Clinical Data: History of type A dissection repair.  Heart failure symptoms 6 months after repair.  Abnormal echocardiogram showing significant aortic insufficiency or pericardial effusion.  CT ANGIOGRAPHY CHEST  Technique:  Multidetector CT imaging of the chest using the standard protocol during bolus administration of intravenous  contrast. Multiplanar reconstructed images including MIPs were obtained and reviewed to evaluate the vascular anatomy.  Contrast: OMNIPAQUE IOHEXOL 350 MG/ML SOLN  Comparison: None.  Findings: Initial contrast injection was performed with bolus timing for visualization of pulmonary arteries.  The patient was then reinjected with decreased dose with bolus timing for visualization of the thoracic aorta.  No filling defects demonstrated in the pulmonary arteries.  No evidence of pulmonary embolus.  There is reflux of contrast material in the inferior vena cava and hepatic veins suggesting passive hepatic congestion.  Postoperative changes in the mediastinum.  Vascular graft in the ascending thoracic aorta appears patent.  Great vessels are patent and tortuous.  No evidence of dissection.  There is dilatation of the aortic arch with diameter measured at up to about 3.8 cm. Proximal descending thoracic aorta measures up to about 5.1 x 5.8 cm diameter.  There is mural thrombus beginning at the distal aortic arch and extending down the descending thoracic aorta. There is heterogeneous mixing of contrast material within the opacified lumen beginning in the arch.  Focal thrombosis, flow turbulence, or dissection could have this appearance.  There is a small pleural effusion on the left with fluid around the lateral margin of the descending thoracic aorta.  No evidence of contrast extravasation.  Density measurements are consistent with non hemorrhagic fluid.  Atelectasis in the left lung base. Visualization of the lungs is limited due to respiratory motion artifact.  No pneumothorax.  Visualized  portions of the upper abdominal organs demonstrate cholelithiasis.  Diffuse cardiac enlargement with four-chamber prominence.  IMPRESSION: Postoperative changes with graft in the ascending thoracic aorta. Dilated arch and descending aorta.  The mural thrombus is present in the descending aorta and the arch.  Heterogeneous  mixing of contrast material in the arch and extending in the non thrombosed lumen.  This may be due to residual dissection, thrombus, or turbulence.  Small left pleural effusion and atelectasis in the left lung base.  No definite evidence of mediastinal hematoma. Cardiac enlargement with passive reflux of contrast material into the inferior vena cava and hepatic veins.  Cholelithiasis.   Original Report Authenticated By: Burman Nieves, M.D.   Personally viewed.   Telemetry: AFIB 80's better rate control, sitting. She will break 100 with activity but it is transient. Personally viewed.    Cardiac Studies:  EF 25-35%  Assessment/Plan:  Principal Problem:   CHF, acute Active Problems:   Hypertension   Aortic aneurysm with dissection   Renal insufficiency   Atrial fibrillation   79 year old status post aortic dissection repair, native aortic valve, atrial fibrillation, chronic anticoagulation, soleal DVT, newly discovered cardiomyopathy, acute systolic heart failure.  1. Acute systolic heart failure  - Very comfortable currently. Euvolemic.  - We will continue with Lasix 40 mg once a day.  - Continue with lisinopril 2.5 mg today. Unable to up titrate because of blood pressure  - Continue with metoprolol 50 mg twice a day. (At some point, I may consolidate to metoprolol succinate)  - Digoxin 0.125 mg once a day.  Once again, nuclear stress test did not show any evidence of ischemia. Ejection fraction at time of surgery was somewhat reduced but during admission, this was a marked reduction. Most likely diagnosis was tachycardia-induced cardiomyopathy from atrial fibrillation with rapid ventricular response.  2. Atrial fibrillation  - Much better rate control.  - Digoxin, metoprolol, amiodarone  3. Chronic anticoagulation  - INR currently 2.1.  - Currently therapeutic.  4. DVT  - INR therapeutic currently. Continue with Coumadin.  Comfortable with discharge from a cardiac  standpoint, but ultimately will defer to internal medicine team. May require one day of overlap/heparin.    Kaliana Albino 03/10/2013, 10:02 AM

## 2013-03-10 NOTE — Progress Notes (Signed)
Daughter arrived at this time; daughter informed of lovenox injections; daughter stated neither her nor the pt could given injections; MD paged; will await callback.

## 2013-03-10 NOTE — Progress Notes (Signed)
Spoke with MD; lovenox dose for home changed; will check with [facility] to see if RN will be able to administer lovenox injection while at the pt's home tomorrow.

## 2013-03-10 NOTE — Progress Notes (Signed)
  ANTICOAGULATION CONSULT NOTE - Follow Up Consult  Pharmacy Consult for coumadin and heparin Indication: DVT  No Known Allergies  Patient Measurements: Height: 5\' 3"  (160 cm) Weight: 132 lb 6.4 oz (60.056 kg) IBW/kg (Calculated) : 52.4 Heparin Dosing Weight:   Vital Signs: Temp: 97.8 F (36.6 C) (07/28 0440) Temp src: Axillary (07/28 0440) BP: 121/75 mmHg (07/28 0440) Pulse Rate: 92 (07/28 0440)  Labs:  Recent Labs  03/08/13 0425 03/09/13 0510 03/10/13 0525  HGB  --   --  12.3  HCT  --   --  39.5  PLT  --   --  277  LABPROT 16.6* 19.4* 22.9*  INR 1.38 1.69* 2.10*  HEPARINUNFRC 0.50 0.51 0.54  CREATININE 1.07 1.02 1.05    Estimated Creatinine Clearance: 33.6 ml/min (by C-G formula based on Cr of 1.05).   Medications:  Scheduled:  . amiodarone  200 mg Oral Daily  . aspirin EC  325 mg Oral Daily  . digoxin  0.125 mg Oral Daily  . docusate sodium  100 mg Oral Daily  . furosemide  40 mg Oral Daily  . lisinopril  2.5 mg Oral Daily  . metoprolol  50 mg Oral BID  . mirtazapine  15 mg Oral QHS  . potassium chloride  20 mEq Oral Daily  . sodium chloride  3 mL Intravenous Q12H  . sodium chloride  3 mL Intravenous Q12H  . warfarin   Does not apply Once  . Warfarin - Pharmacist Dosing Inpatient   Does not apply q1800   Infusions:  . heparin 1,200 Units/hr (03/09/13 1726)    Assessment: 79 yo female with DVT is currently on therapeutic coumadin and heparin.  Heparin level 0.54 and INR 2.1.  Day 7 of overlap Goal of Therapy:  Heparin level 0.3-0.7 units/ml; INR 2-3 Monitor platelets by anticoagulation protocol: Yes   Plan:  -Coumadin 5 mg x1 -Continue Heparin infusion to 1200 units/hr -Daily heparin level/cbc/inr -If INR good tom, will d/c heparin.   [name], Tsz-Yin 03/10/2013,9:40 AM

## 2013-03-10 NOTE — Progress Notes (Signed)
IV and tele monitor d/c at this time; pt to d/c home with daughter; daughter called and cannot be here to pick pt up until 1730 tonight; will await to discuss d/c instructions when daughter arrives.

## 2013-03-10 NOTE — Discharge Summary (Signed)
Physician Discharge Summary  [name] AVW:[id number] DOB: Oct 15, 1927 DOA: 03/04/2013  PCP: Emeterio Reeve, MD  Admit date: 03/04/2013 Discharge date: 03/10/2013  Time spent: 30  minutes  Recommendations for Outpatient Follow-up:  1. Follow up with PCP in one week 2. Follow up with cardiology in one week  Discharge Diagnoses:  Principal Problem:   CHF, acute Active Problems:   Hypertension   Aortic aneurysm with dissection   Renal insufficiency   Atrial fibrillation   Discharge Condition: improved  Diet recommendation: low salt diet  Filed Weights   03/08/13 0421 03/09/13 0539 03/10/13 0440  Weight: 60.1 kg (132 lb 7.9 oz) 59.966 kg (132 lb 3.2 oz) 60.056 kg (132 lb 6.4 oz)    History of present illness:  [name] is a 79 y.o. female with history of repair of subacute type A aortic dissection in March of this year, history of atrial fibrillation and hypertension presented to the ER because of shortness of breath. Patient had followed up with her surgeon yesterday. She states she was doing fine then but later in the evening she became short of breath. Over the last 2 days she's been also noticing increasing swelling in the lower extremities. Denies any productive cough fever chills or chest pain but she did have some chest discomfort. In the ER chest x-ray review features concerning for CHF and was given one dose of 40 mg IV Lasix and has been admitted for further management of CHF. Patient denies any nausea vomiting abdominal pain diarrhea. Patient presently is not in acute distress.    Hospital Course:  CHF exacerbation  Monitor on tele. She was started on IV lasix q 12hrt ransitioned to oral lasix. Repeat 2D echo showed global decreased LV function, . Last echo from march with normal EF.  Cardiology consult from Dr Anne Fu requested and appreciate recommendations   I/O last 3 completed shifts:  In: 1212.5 [P.O.:480; I.V.:732.5]  Out: 1102 [Urine:1102]  Total I/O  In:  120 [P.O.:120]  Out: -  Myo view showed  1. Global hypokinesis with calculated left ventricular ejection fraction of 34%. 2. Small fixed perfusion defects at the inferolateral apex is without focal wall motion abnormality and may reflect apical thinning as opposed to focal scar. 3. No evidence of pharmacologically induced myocardial ischemia  Afib  rate not well controlled. Metorpolol increased to 50 mg bid. continue amiodarone. 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 Continue ASA and coumadin. INR therapeutic. Sending her home on one dose of lovenox.  Recent ascending aortic dissection repair  Sx in march. Follows with Dr Zenaida Niece tright as outpt. Appreciate Dr Donata Clay recommendations.  AKI  mild.improving. .  New left soleal DVT: she was started on IV heparin and po coumadin. INR therapeutic. She was sent home on one day of lovenox.    Hypokalemia: repleted as needed.    Procedures:  none  Consultations: Dr Anne Fu [facility] cards) Cardio thoracic from DR Donata Clay   Discharge Exam: Filed Vitals:   03/09/13 1206 03/09/13 1500 03/09/13 2234 03/10/13 0440  BP: 112/61 102/56 104/72 121/75  Pulse: 85 79 81 92  Temp:  97.5 F (36.4 C) 97.5 F (36.4 C) 97.8 F (36.6 C)  TempSrc:  Oral Oral Axillary  Resp:  16 18 20   Height:      Weight:    60.056 kg (132 lb 6.4 oz)  SpO2:  94% 96% 96%   General: Elderly female comfortable.  HEENT: no pallor, no JVD  Chest:CTAB  CVS: S1&S2   Abd: soft, NT, ND, BS+  Ext: 1 + edema B/L    Discharge Instructions  Discharge Orders   Future Orders Complete By Expires     Diet - low sodium heart healthy  As directed     Discharge instructions  As directed     Comments:      Follow up with PCP and Dr Anne Fu as recommended Follow upw ith Dr Donata Clay in one to two weeks. Please check INR tomorrow with [facility]        Medication List    STOP taking these medications       diltiazem 120 MG 24 hr  capsule  Commonly known as:  DILACOR XR      TAKE these medications       acetaminophen 500 MG tablet  Commonly known as:  TYLENOL  Take 500 mg by mouth every 6 (six) hours as needed for pain.     amiodarone 200 MG tablet  Commonly known as:  PACERONE  Take 200 mg by mouth daily.     aspirin 81 MG chewable tablet  Chew 81 mg by mouth daily.     digoxin 0.125 MG tablet  Commonly known as:  LANOXIN  Take 1 tablet (0.125 mg total) by mouth daily.     DSS 100 MG Caps  Take 100 mg by mouth daily.     enoxaparin 30 MG/0.3ML injection  Commonly known as:  LOVENOX  Inject 0.6 mLs (60 mg total) into the skin every 12 (twelve) hours.     furosemide 40 MG tablet  Commonly known as:  LASIX  Take 1 tablet (40 mg total) by mouth daily.     lisinopril 2.5 MG tablet  Commonly known as:  PRINIVIL,ZESTRIL  Take 1 tablet (2.5 mg total) by mouth daily.     metoprolol 50 MG tablet  Commonly known as:  LOPRESSOR  Take 1 tablet (50 mg total) by mouth 2 (two) times daily.     mirtazapine 15 MG tablet  Commonly known as:  REMERON  Take 15 mg by mouth at bedtime.     warfarin 5 MG tablet  Commonly known as:  COUMADIN  Take 1 tablet (5 mg total) by mouth one time only at 6 PM.       No Known Allergies     Follow-up Information   Follow up with Donato Schultz, MD On 03/19/2013. (2:15pm, Also, Thursday 7/31 10:00am with Alfonse Ras Pharm D to monitor COUMADIN)    Contact information:   [address] [place] [address] [phone]       Follow up with Emeterio Reeve, MD. Schedule an appointment as soon as possible for a visit in 1 week.   Contact information:   [address] [place] [address] [phone]       Follow up with VAN Dinah Beers, MD. Schedule an appointment as soon as possible for a visit in 1 week.   Contact information:   [address] [place] [address] [phone]        The results of significant diagnostics  from this hospitalization (including imaging, microbiology, ancillary and laboratory) are listed below for reference.    Significant Diagnostic Studies: Dg Chest 2 View  03/05/2013   *RADIOLOGY REPORT*  Clinical Data: Congestive heart failure.  CHEST - 2 VIEW  Comparison: 03/04/2013 and 01/01/2013  Findings: Cardiomegaly and tortuosity and dilatation  of the thoracic aorta is unchanged.  Pulmonary vascularity is within normal limits.  There are tiny bilateral effusions, unchanged. There is focal atelectasis at the left lung base posteriorly and in the lingula, slightly increased.  Aeration at the right base has improved.  IMPRESSION: Interval improvement in pulmonary vascularity and aeration at the right lung base.  Slight increased focal atelectasis at the left base.   Original Report Authenticated By: Francene Boyers, M.D.   Dg Chest 2 View  03/04/2013   *RADIOLOGY REPORT*  Clinical Data: Shortness of breath, dizziness, chest pain.  CHEST - 2 VIEW  Comparison: 01/01/2013  Findings: Stable appearance of postoperative change in the mediastinum.  Shallow inspiration.  Cardiac enlargement with dilated and tortuous thoracic aorta.  Pulmonary vascularity is mildly increased.  No significant edema.  Atelectasis in the lung bases is new since previous study.  Small bilateral pleural effusions.  No pneumothorax.  Probable esophageal hiatal hernia behind the heart.  IMPRESSION: Cardiac enlargement with thoracic aortic dilatation.  Increasing pulmonary vascular congestion with small bilateral pleural effusions and basilar atelectasis new since previous study. Probable esophageal hiatal hernia behind the heart.   Original Report Authenticated By: Burman Nieves, M.D.   Ct Angio Chest Pe W/cm &/or Wo Cm  03/09/2013   *RADIOLOGY REPORT*  Clinical Data: History of type A dissection repair.  Heart failure symptoms 6 months after repair.  Abnormal echocardiogram showing significant aortic insufficiency or pericardial  effusion.  CT ANGIOGRAPHY CHEST  Technique:  Multidetector CT imaging of the chest using the standard protocol during bolus administration of intravenous contrast. Multiplanar reconstructed images including MIPs were obtained and reviewed to evaluate the vascular anatomy.  Contrast: OMNIPAQUE IOHEXOL 350 MG/ML SOLN  Comparison: None.  Findings: Initial contrast injection was performed with bolus timing for visualization of pulmonary arteries.  The patient was then reinjected with decreased dose with bolus timing for visualization of the thoracic aorta.  No filling defects demonstrated in the pulmonary arteries.  No evidence of pulmonary embolus.  There is reflux of contrast material in the inferior vena cava and hepatic veins suggesting passive hepatic congestion.  Postoperative changes in the mediastinum.  Vascular graft in the ascending thoracic aorta appears patent.  Great vessels are patent and tortuous.  No evidence of dissection.  There is dilatation of the aortic arch with diameter measured at up to about 3.8 cm. Proximal descending thoracic aorta measures up to about 5.1 x 5.8 cm diameter.  There is mural thrombus beginning at the distal aortic arch and extending down the descending thoracic aorta. There is heterogeneous mixing of contrast material within the opacified lumen beginning in the arch.  Focal thrombosis, flow turbulence, or dissection could have this appearance.  There is a small pleural effusion on the left with fluid around the lateral margin of the descending thoracic aorta.  No evidence of contrast extravasation.  Density measurements are consistent with non hemorrhagic fluid.  Atelectasis in the left lung base. Visualization of the lungs is limited due to respiratory motion artifact.  No pneumothorax.  Visualized portions of the upper abdominal organs demonstrate cholelithiasis.  Diffuse cardiac enlargement with four-chamber prominence.  IMPRESSION: Postoperative changes with graft in  the ascending thoracic aorta. Dilated arch and descending aorta.  The mural thrombus is present in the descending aorta and the arch.  Heterogeneous mixing of contrast material in the arch and extending in the non thrombosed lumen.  This may be due to residual dissection, thrombus, or turbulence.  Small left  pleural effusion and atelectasis in the left lung base.  No definite evidence of mediastinal hematoma. Cardiac enlargement with passive reflux of contrast material into the inferior vena cava and hepatic veins.  Cholelithiasis.   Original Report Authenticated By: Burman Nieves, M.D.   Nm Myocar Multi W/spect W/wall Motion / Ef  03/05/2013   *RADIOLOGY REPORT*  Clinical Data:  History of atrial fibrillation, hypertension, congestive heart failure and aortic root aneurysm.  MYOCARDIAL IMAGING WITH SPECT (REST AND PHARMACOLOGIC-STRESS) GATED LEFT VENTRICULAR WALL MOTION STUDY LEFT VENTRICULAR EJECTION FRACTION  Technique:  Resting myocardial SPECT imaging was initially performed after intravenous administration of radiopharmaceutical. Myocardial SPECT was subsequently performed after additional radiopharmaceutical injection during pharmacologic-stress (Lexiscan)supervised by the Cardiology staff.  Quantitative gated imaging was also performed to evaluate left ventricular wall motion, and estimate left ventricular ejection fraction.  Radiopharmaceutical:  10 mCi Tc-30m sestamibi at rest and 30 mCi during stress.  Comparison: Chest radiographs 03/05/2013.  Chest CT 11/06/2012.  Findings:  SPECT images demonstrate normal left ventricular activity aside from a small fixed perfusion defect involving the inferolateral apex.  There are no reversible perfusion defects.  Gated cine images were reviewed on the workstation and demonstrate global hypokinesis.  No focal wall motion abnormalities identified at the apex.   The QGS ejection fraction measured at rest is 34% with an end diastolic volume of 84 ml and an  end-systolic volume of 55 ml.  IMPRESSION:  1.  Global hypokinesis with calculated left ventricular ejection fraction of 34%. 2.  Small fixed perfusion defects at the inferolateral apex is without focal wall motion abnormality and may reflect apical thinning as opposed to focal scar. 3.  No evidence of pharmacologically induced myocardial ischemia.   Original Report Authenticated By: Carey Bullocks, M.D.    Microbiology: No results found for this or any previous visit (from the past 240 hour(s)).   Labs: Basic Metabolic Panel:  Recent Labs Lab 03/05/13 0340  03/06/13 0415 03/06/13 2110 03/08/13 0425 03/09/13 0510 03/10/13 0525  NA 145  --  142  --  140 136 138  K 2.9*  < > 3.0* 4.2 3.4* 4.2 4.3  CL 104  --  105  --  104 102 107  CO2 28  --  25  --  26 25 21   GLUCOSE 100*  --  97  --  119* 110* 92  BUN 25*  --  20  --  19 17 13   CREATININE 1.18*  --  0.99  --  1.07 1.02 1.05  CALCIUM 8.8  --  8.4  --  8.7 8.8 9.0  MG 1.6  --  1.6  --   --   --   --   < > = values in this interval not displayed. Liver Function Tests:  Recent Labs Lab 03/04/13 0735  AST 34  ALT 40*  ALKPHOS 113  BILITOT 0.6  PROT 6.4  ALBUMIN 3.1*   No results found for this basename: LIPASE, AMYLASE,  in the last 168 hours No results found for this basename: AMMONIA,  in the last 168 hours CBC:  Recent Labs Lab 03/04/13 0109 03/04/13 0735 03/05/13 0340 03/06/13 0415 03/07/13 0415 03/10/13 0525  WBC 7.2 7.0 8.0 7.2 8.1 5.1  NEUTROABS 5.0 4.1  --   --   --   --   HGB 12.8 12.9 12.7 12.0 13.4 12.3  HCT 38.6 39.9 38.7 36.4 40.7 39.5  MCV 88.3 88.7 87.8 86.9 87.3 88.6  PLT 253 239  261 258 308 277   Cardiac Enzymes:  Recent Labs Lab 03/04/13 0109 03/04/13 0735 03/04/13 1250 03/04/13 1626  TROPONINI <0.30 <0.30 <0.30 <0.30   BNP: BNP (last 3 results)  Recent Labs  03/04/13 0109 03/06/13 0415  PROBNP 6122.0* 5114.0*   CBG: No results found for this basename: GLUCAP,  in the last 168  hours     Signed:  Kemara Quigley  [facility] 03/10/2013, 2:38 PM

## 2013-03-10 NOTE — Progress Notes (Signed)
Still awaiting daughter to arrive to review d/c instructions and lovenox injections; will cont. To monitor.

## 2013-03-19 ENCOUNTER — Ambulatory Visit (INDEPENDENT_AMBULATORY_CARE_PROVIDER_SITE_OTHER): Payer: Medicare PPO | Admitting: Cardiothoracic Surgery

## 2013-03-19 ENCOUNTER — Encounter: Payer: Self-pay | Admitting: Cardiothoracic Surgery

## 2013-03-19 VITALS — BP 124/75 | HR 67 | Resp 18 | Ht 61.0 in | Wt 130.0 lb

## 2013-03-19 DIAGNOSIS — I429 Cardiomyopathy, unspecified: Secondary | ICD-10-CM

## 2013-03-19 DIAGNOSIS — Z9889 Other specified postprocedural states: Secondary | ICD-10-CM

## 2013-03-19 DIAGNOSIS — I428 Other cardiomyopathies: Secondary | ICD-10-CM

## 2013-03-19 DIAGNOSIS — I509 Heart failure, unspecified: Secondary | ICD-10-CM

## 2013-03-19 DIAGNOSIS — Z8679 Personal history of other diseases of the circulatory system: Secondary | ICD-10-CM

## 2013-03-19 NOTE — Progress Notes (Signed)
PCP is Emeterio Reeve, MD Referring Provider is Emeterio Reeve, MD  Chief Complaint  Patient presents with  . Congestive Heart Failure    F/u from resent hospital stay for acute CHF excerbation, CTA Chest 03/09/13, S/P  ascending aortic dissection and aneurysm repair,     HPI: 6 months postop replacement of fusiform ascending aortic aneurysm with Dacron graft for type A dissection. 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 EF was approximately 25%. Her beta blocker was reduced from 100 mg twice a day metoprolol to 50 mg by mouth twice a day and digoxin and low-dose amiodarone were added. She is much better now clinically and her heart rate is 70 controlled atrial fibrillation. She was also placed on Coumadin.  While she was hospitalized a CTA of the thoracic aorta was performed to assess the repair which was intact without evidence of false aneurysm and with resolution of the dissection in the proximal aortic root.    Past Medical History  Diagnosis Date  . Afib   . Aortic root aneurysm   . Hypertension   . CHF (congestive heart failure)   . Shortness of breath     Past Surgical History  Procedure Laterality Date  . Bentall procedure N/A 11/11/2012    Procedure: BENTALL PROCEDURE;  Surgeon: Kerin Perna, MD;  Location: [facility] OR;  Service: Open Heart Surgery;  Laterality: N/A;  *CIRC ARREST*   . Intraoperative transesophageal echocardiogram N/A 11/11/2012    Procedure: INTRAOPERATIVE TRANSESOPHAGEAL ECHOCARDIOGRAM;  Surgeon: Kerin Perna, MD;  Location: [facility] OR;  Service: Open Heart Surgery;  Laterality: N/A;  . Ascending aortic aneurysm repair  10/2012    No family history on file.  Social History History  Substance Use Topics  . Smoking status: Never Smoker   . Smokeless tobacco: Never Used  . Alcohol Use: No    Current Outpatient Prescriptions  Medication Sig Dispense Refill  .  acetaminophen (TYLENOL) 500 MG tablet Take 500 mg by mouth every 6 (six) hours as needed for pain.      Marland Kitchen amiodarone (PACERONE) 200 MG tablet Take 200 mg by mouth daily.      . digoxin (LANOXIN) 0.125 MG tablet Take 1 tablet (0.125 mg total) by mouth daily.  30 tablet  0  . docusate sodium 100 MG CAPS Take 100 mg by mouth daily.  10 capsule  0  . furosemide (LASIX) 40 MG tablet Take 1 tablet (40 mg total) by mouth daily.  30 tablet  1  . lisinopril (PRINIVIL,ZESTRIL) 2.5 MG tablet Take 1 tablet (2.5 mg total) by mouth daily.  30 tablet  1  . metoprolol (LOPRESSOR) 50 MG tablet Take 1 tablet (50 mg total) by mouth 2 (two) times daily.  60 tablet  0  . potassium chloride SA (K-DUR,KLOR-CON) 20 MEQ tablet Take 20 mEq by mouth daily.      Marland Kitchen warfarin (COUMADIN) 5 MG tablet Take 1 tablet (5 mg total) by mouth one time only at 6 PM.  15 tablet  0   No current facility-administered medications for this visit.    No Known Allergies  Review of SystemsDoing well at home  BP 124/75  Pulse 67  Resp 18  Ht 5\' 1"  (1.549 m)  Wt 130 lb (58.968 kg)  BMI 24.58 kg/m2  SpO2 98% Physical Exam Alert and comfortable Lungs clear Heart rate irregular-atrial fibrillation heart rate 70-80 No edema Good peripheral pulses Surgical incision well-healed  Diagnostic Tests: No chest x-ray done today  Impression: Compensated CHF with readjustment of medications Repair of type A dissection intact  Plan:Return in 3 months with chest x-ray. Medications will be discussed with the patient's daughters due to her trouble understanding [place]- language.

## 2013-04-10 ENCOUNTER — Other Ambulatory Visit: Payer: Self-pay | Admitting: *Deleted

## 2013-04-10 DIAGNOSIS — I712 Thoracic aortic aneurysm, without rupture: Secondary | ICD-10-CM

## 2013-04-28 ENCOUNTER — Other Ambulatory Visit: Payer: Self-pay | Admitting: *Deleted

## 2013-04-28 ENCOUNTER — Ambulatory Visit
Admission: RE | Admit: 2013-04-28 | Discharge: 2013-04-28 | Disposition: A | Discharge status: Home / Self Care | Payer: Commercial Managed Care - HMO | Source: Ambulatory Visit | Attending: Cardiothoracic Surgery | Admitting: Cardiothoracic Surgery

## 2013-04-28 DIAGNOSIS — I712 Thoracic aortic aneurysm, without rupture: Secondary | ICD-10-CM

## 2013-04-28 DIAGNOSIS — I719 Aortic aneurysm of unspecified site, without rupture: Secondary | ICD-10-CM

## 2013-04-28 LAB — BUN: BUN: 23 mg/dL (ref 6–23)

## 2013-04-28 LAB — CREATININE, SERUM: Creat: 1.1 mg/dL (ref 0.50–1.10)

## 2013-04-28 MED ORDER — IOHEXOL 350 MG/ML SOLN
75.0000 mL | Freq: Once | INTRAVENOUS | Status: AC | PRN
  Administered 2013-04-28: 75 mL via INTRAVENOUS

## 2013-04-30 ENCOUNTER — Ambulatory Visit: Payer: Medicare PPO | Admitting: Cardiothoracic Surgery

## 2013-04-30 ENCOUNTER — Other Ambulatory Visit: Payer: Medicare PPO

## 2013-05-07 ENCOUNTER — Ambulatory Visit (INDEPENDENT_AMBULATORY_CARE_PROVIDER_SITE_OTHER): Payer: Commercial Managed Care - HMO | Admitting: Cardiothoracic Surgery

## 2013-05-07 ENCOUNTER — Inpatient Hospital Stay (HOSPITAL_COMMUNITY)
Admission: EM | Admit: 2013-05-07 | Discharge: 2013-05-16 | DRG: 316 | Disposition: A | Discharge status: Home Health | Payer: Medicare PPO | Attending: Cardiothoracic Surgery | Admitting: Cardiothoracic Surgery

## 2013-05-07 ENCOUNTER — Encounter (HOSPITAL_COMMUNITY): Payer: Self-pay | Admitting: *Deleted

## 2013-05-07 ENCOUNTER — Encounter: Payer: Self-pay | Admitting: Cardiothoracic Surgery

## 2013-05-07 VITALS — BP 167/90 | HR 60 | Resp 18 | Ht 61.0 in | Wt 130.0 lb

## 2013-05-07 DIAGNOSIS — I10 Essential (primary) hypertension: Secondary | ICD-10-CM | POA: Diagnosis present

## 2013-05-07 DIAGNOSIS — I7100 Dissection of unspecified site of aorta: Secondary | ICD-10-CM

## 2013-05-07 DIAGNOSIS — I719 Aortic aneurysm of unspecified site, without rupture: Secondary | ICD-10-CM

## 2013-05-07 DIAGNOSIS — Z8679 Personal history of other diseases of the circulatory system: Secondary | ICD-10-CM

## 2013-05-07 DIAGNOSIS — T45515A Adverse effect of anticoagulants, initial encounter: Secondary | ICD-10-CM | POA: Diagnosis present

## 2013-05-07 DIAGNOSIS — Z7901 Long term (current) use of anticoagulants: Secondary | ICD-10-CM

## 2013-05-07 DIAGNOSIS — I509 Heart failure, unspecified: Secondary | ICD-10-CM | POA: Diagnosis present

## 2013-05-07 DIAGNOSIS — I4891 Unspecified atrial fibrillation: Secondary | ICD-10-CM | POA: Diagnosis present

## 2013-05-07 DIAGNOSIS — R58 Hemorrhage, not elsewhere classified: Principal | ICD-10-CM | POA: Diagnosis present

## 2013-05-07 DIAGNOSIS — Z9889 Other specified postprocedural states: Secondary | ICD-10-CM

## 2013-05-07 NOTE — Progress Notes (Signed)
PCP is Emeterio Reeve, MD Referring Provider is Donato Schultz, MD  Chief Complaint  Patient presents with  . Routine Post Op    4 month f/u with Chest CTA, S/P subacute type a dissection 11/11/2012    HPI: 6 month followup after repair of type A dissection of the thoracic aorta using 30 mm heme shield graft, resuspension of aortic valve, and hypothermic circulatory arrest. Patient has chronic atrial fibrillation and is on Coumadin. Patient was readmitted to the hospital 2 months postop with CHF and reduced ejection fraction . She had no significant AI on echo but had mild to moderate TR and elevated PA pressures. She is treated with diuresis and medications including lisinopril and metoprolol. She is significantly improved clinically inch returns now for a CTA of the thoracic aorta to evaluate her repair.  CTA he is reviewed with patient and daughter and demonstrates intact repair without evidence of pseudoaneurysm and patent at vessels. The intramural hematoma of the descending aorta from the previous dissection has become more organized. The total diameter of the descending thoracic aorta including hematoma is 5.2 cm. At age 79 conservative management of her residual descending thoracic aorta is the best approach.  On exam today her blood pressure was elevated at 160/90 and her lisinopril be titrated up from 2.5-5 mg daily.  Past Medical History  Diagnosis Date  . Afib   . Aortic root aneurysm   . Hypertension   . CHF (congestive heart failure)   . Shortness of breath     Past Surgical History  Procedure Laterality Date  . Bentall procedure N/A 11/11/2012    Procedure: BENTALL PROCEDURE;  Surgeon: Kerin Perna, MD;  Location: [facility] OR;  Service: Open Heart Surgery;  Laterality: N/A;  *CIRC ARREST*   . Intraoperative transesophageal echocardiogram N/A 11/11/2012    Procedure: INTRAOPERATIVE TRANSESOPHAGEAL ECHOCARDIOGRAM;  Surgeon: Kerin Perna, MD;  Location: [facility] OR;  Service: Open  Heart Surgery;  Laterality: N/A;  . Ascending aortic aneurysm repair  10/2012    No family history on file.  Social History History  Substance Use Topics  . Smoking status: Never Smoker   . Smokeless tobacco: Never Used  . Alcohol Use: No    Current Outpatient Prescriptions  Medication Sig Dispense Refill  . amiodarone (PACERONE) 200 MG tablet Take 200 mg by mouth daily.      . Ascorbic Acid (VITAMIN C) 1000 MG tablet Take 1,000 mg by mouth daily.      . digoxin (LANOXIN) 0.125 MG tablet Take 1 tablet (0.125 mg total) by mouth daily.  30 tablet  0  . furosemide (LASIX) 40 MG tablet Take 1 tablet (40 mg total) by mouth daily.  30 tablet  1  . lisinopril (PRINIVIL,ZESTRIL) 2.5 MG tablet Take 1 tablet (2.5 mg total) by mouth daily.  30 tablet  1  . metoprolol (LOPRESSOR) 50 MG tablet Take 1 tablet (50 mg total) by mouth 2 (two) times daily.  60 tablet  0  . potassium chloride SA (K-DUR,KLOR-CON) 20 MEQ tablet Take 20 mEq by mouth daily.      Marland Kitchen warfarin (COUMADIN) 5 MG tablet Take 1 tablet (5 mg total) by mouth one time only at 6 PM.  15 tablet  0   No current facility-administered medications for this visit.    No Known Allergies  Review of Systems overall improved 6 months postop. Better energy improved appetite that her sleeping habits. Residual mild pedal edema on chronic Lasix.  BP 167/90  Pulse 60  Resp 18  Ht 5\' 1"  (1.549 m)  Wt 130 lb (58.968 kg)  BMI 24.58 kg/m2  SpO2 97% Physical Exam Alert and comfortable, pleasant neuro intact Lungs clear No JVD, good carotid pulses Heart rhythm irregular but controlled rate, no murmur or gallop Mild pedal edema Sternal incision well-healed  Diagnostic Tests: CTA of the thoracic aorta reviewed showing intact repair no edema no pericardial effusion. Descending thoracic aorta has a organized intramural hematoma from the old dissection. The descending aortic diameter remains stable at just over 5 cm  Impression: Doing well 6  months postop repair type a dissection  Plan: Blood pressure control is important and I have stressed this to the patient and to her daughter. I will plan on seeing the patient back in 10-12 weeks for followup.

## 2013-05-07 NOTE — ED Notes (Signed)
Pt states that she has upper back pain that is in her left shoulder blade and it has been getting worse over the last couple of days. Pt states that she has been taking tylenol and it has not been helping. Pt has open heart surgery 6 months ago and in July had another week stay in the hospital. Pt states MRI of chest last Monday was clear.

## 2013-05-08 ENCOUNTER — Emergency Department (HOSPITAL_COMMUNITY): Payer: Medicare PPO

## 2013-05-08 ENCOUNTER — Encounter (HOSPITAL_COMMUNITY): Payer: Self-pay | Admitting: Radiology

## 2013-05-08 DIAGNOSIS — I7101 Dissection of thoracic aorta: Secondary | ICD-10-CM

## 2013-05-08 LAB — CBC
HCT: 41.9 % (ref 36.0–46.0)
Hemoglobin: 13.6 g/dL (ref 12.0–15.0)
MCH: 28.5 pg (ref 26.0–34.0)
MCHC: 32.5 g/dL (ref 30.0–36.0)
MCV: 87.7 fL (ref 78.0–100.0)
Platelets: 293 10*3/uL (ref 150–400)
RBC: 4.78 MIL/uL (ref 3.87–5.11)
RDW: 16.7 % — ABNORMAL HIGH (ref 11.5–15.5)
WBC: 10.4 10*3/uL (ref 4.0–10.5)

## 2013-05-08 LAB — PROTIME-INR
INR: 1.82 — ABNORMAL HIGH (ref 0.00–1.49)
INR: 1.15 (ref 0.00–1.49)
INR: 1.22 (ref 0.00–1.49)
INR: 1.17 (ref 0.00–1.49)
Prothrombin Time: 20.5 seconds — ABNORMAL HIGH (ref 11.6–15.2)
Prothrombin Time: 14.5 seconds (ref 11.6–15.2)
Prothrombin Time: 15.1 seconds (ref 11.6–15.2)
Prothrombin Time: 14.7 seconds (ref 11.6–15.2)

## 2013-05-08 LAB — COMPREHENSIVE METABOLIC PANEL
ALT: 16 U/L (ref 0–35)
AST: 16 U/L (ref 0–37)
Albumin: 3.4 g/dL — ABNORMAL LOW (ref 3.5–5.2)
Alkaline Phosphatase: 74 U/L (ref 39–117)
BUN: 15 mg/dL (ref 6–23)
CO2: 23 mEq/L (ref 19–32)
Calcium: 8.6 mg/dL (ref 8.4–10.5)
Chloride: 103 mEq/L (ref 96–112)
Creatinine, Ser: 0.86 mg/dL (ref 0.50–1.10)
GFR calc Af Amer: 70 mL/min — ABNORMAL LOW (ref >90)
GFR calc non Af Amer: 61 mL/min — ABNORMAL LOW (ref >90)
Glucose, Bld: 140 mg/dL — ABNORMAL HIGH (ref 70–99)
Potassium: 4.2 mEq/L (ref 3.5–5.1)
Sodium: 139 mEq/L (ref 135–145)
Total Bilirubin: 1.2 mg/dL (ref 0.3–1.2)
Total Protein: 7.6 g/dL (ref 6.0–8.3)

## 2013-05-08 LAB — POCT I-STAT TROPONIN I: Troponin i, poc: 0 ng/mL (ref 0.00–0.08)

## 2013-05-08 LAB — TYPE AND SCREEN
ABO/RH(D): A POS
Antibody Screen: NEGATIVE

## 2013-05-08 LAB — APTT: aPTT: 37 seconds (ref 24–37)

## 2013-05-08 MED ORDER — VITAMIN C 500 MG PO TABS
1000.0000 mg | ORAL_TABLET | Freq: Every day | ORAL | Status: DC
  Administered 2013-05-08 – 2013-05-16 (×8): 1000 mg via ORAL
  Filled 2013-05-08 (×9): qty 2

## 2013-05-08 MED ORDER — VITAMIN K1 10 MG/ML IJ SOLN
10.0000 mg | INTRAVENOUS | Status: AC
  Administered 2013-05-08: 10 mg via INTRAVENOUS
  Filled 2013-05-08: qty 1

## 2013-05-08 MED ORDER — SODIUM CHLORIDE 0.9 % IV SOLN
1000.0000 mL | INTRAVENOUS | Status: DC
  Administered 2013-05-08 (×2): 1000 mL via INTRAVENOUS

## 2013-05-08 MED ORDER — DOCUSATE SODIUM 100 MG PO CAPS
100.0000 mg | ORAL_CAPSULE | Freq: Two times a day (BID) | ORAL | Status: DC
  Administered 2013-05-08 – 2013-05-16 (×15): 100 mg via ORAL
  Filled 2013-05-08 (×17): qty 1

## 2013-05-08 MED ORDER — AMIODARONE HCL 200 MG PO TABS
200.0000 mg | ORAL_TABLET | Freq: Every day | ORAL | Status: DC
  Administered 2013-05-08 – 2013-05-16 (×9): 200 mg via ORAL
  Filled 2013-05-08 (×9): qty 1

## 2013-05-08 MED ORDER — NITROGLYCERIN IN D5W 200-5 MCG/ML-% IV SOLN
2.0000 ug/min | INTRAVENOUS | Status: DC
  Administered 2013-05-08: 10 ug/min via INTRAVENOUS

## 2013-05-08 MED ORDER — NITROGLYCERIN IN D5W 200-5 MCG/ML-% IV SOLN
INTRAVENOUS | Status: AC
  Filled 2013-05-08: qty 250

## 2013-05-08 MED ORDER — SODIUM CHLORIDE 0.9 % IJ SOLN
3.0000 mL | Freq: Two times a day (BID) | INTRAMUSCULAR | Status: DC
  Administered 2013-05-08 – 2013-05-15 (×15): 3 mL via INTRAVENOUS

## 2013-05-08 MED ORDER — POTASSIUM CHLORIDE IN NACL 20-0.45 MEQ/L-% IV SOLN
INTRAVENOUS | Status: DC
  Administered 2013-05-08 – 2013-05-09 (×2): via INTRAVENOUS
  Filled 2013-05-08 (×6): qty 1000

## 2013-05-08 MED ORDER — FUROSEMIDE 40 MG PO TABS
40.0000 mg | ORAL_TABLET | Freq: Every day | ORAL | Status: DC
  Administered 2013-05-08 – 2013-05-16 (×9): 40 mg via ORAL
  Filled 2013-05-08 (×9): qty 1

## 2013-05-08 MED ORDER — METOPROLOL TARTRATE 50 MG PO TABS
50.0000 mg | ORAL_TABLET | Freq: Two times a day (BID) | ORAL | Status: DC
  Administered 2013-05-08 – 2013-05-16 (×17): 50 mg via ORAL
  Filled 2013-05-08 (×18): qty 1

## 2013-05-08 MED ORDER — ALUM & MAG HYDROXIDE-SIMETH 200-200-20 MG/5ML PO SUSP
30.0000 mL | Freq: Four times a day (QID) | ORAL | Status: DC | PRN
  Administered 2013-05-12: 30 mL via ORAL
  Filled 2013-05-08: qty 30

## 2013-05-08 MED ORDER — PANTOPRAZOLE SODIUM 40 MG PO TBEC
40.0000 mg | DELAYED_RELEASE_TABLET | Freq: Every day | ORAL | Status: DC
  Administered 2013-05-08 – 2013-05-16 (×8): 40 mg via ORAL
  Filled 2013-05-08 (×8): qty 1

## 2013-05-08 MED ORDER — FENTANYL CITRATE 0.05 MG/ML IJ SOLN
25.0000 ug | INTRAMUSCULAR | Status: DC | PRN
  Administered 2013-05-10: 25 ug via INTRAVENOUS
  Filled 2013-05-08: qty 2

## 2013-05-08 MED ORDER — METOPROLOL TARTRATE 1 MG/ML IV SOLN
5.0000 mg | Freq: Once | INTRAVENOUS | Status: AC
  Administered 2013-05-08: 5 mg via INTRAVENOUS
  Filled 2013-05-08: qty 5

## 2013-05-08 MED ORDER — TRAMADOL HCL 50 MG PO TABS
50.0000 mg | ORAL_TABLET | Freq: Four times a day (QID) | ORAL | Status: DC | PRN
  Administered 2013-05-14: 50 mg via ORAL
  Filled 2013-05-08 (×2): qty 1

## 2013-05-08 MED ORDER — LISINOPRIL 10 MG PO TABS
10.0000 mg | ORAL_TABLET | Freq: Every day | ORAL | Status: DC
  Administered 2013-05-08: 10 mg via ORAL
  Filled 2013-05-08 (×2): qty 1

## 2013-05-08 MED ORDER — EMPTY CONTAINERS FLEXIBLE MISC
1656.0000 [IU] | Status: AC
  Administered 2013-05-08: 1656 [IU] via INTRAVENOUS
  Filled 2013-05-08: qty 66

## 2013-05-08 MED ORDER — ONDANSETRON HCL 4 MG/2ML IJ SOLN
4.0000 mg | Freq: Four times a day (QID) | INTRAMUSCULAR | Status: DC | PRN
  Administered 2013-05-09 – 2013-05-15 (×2): 4 mg via INTRAVENOUS
  Filled 2013-05-08 (×2): qty 2

## 2013-05-08 MED ORDER — MORPHINE SULFATE 2 MG/ML IJ SOLN
2.0000 mg | INTRAMUSCULAR | Status: DC | PRN
  Administered 2013-05-08: 2 mg via INTRAVENOUS
  Filled 2013-05-08: qty 1

## 2013-05-08 MED ORDER — NITROPRUSSIDE SODIUM 25 MG/ML IV SOLN
0.2500 ug/kg/min | Freq: Once | INTRAVENOUS | Status: AC
  Administered 2013-05-08: 0.25 ug/kg/min via INTRAVENOUS
  Filled 2013-05-08: qty 2

## 2013-05-08 MED ORDER — SORBITOL 70 % SOLN
30.0000 mL | Freq: Every day | Status: DC | PRN
  Administered 2013-05-10: 30 mL via ORAL
  Filled 2013-05-08: qty 30

## 2013-05-08 MED ORDER — IOHEXOL 350 MG/ML SOLN
100.0000 mL | Freq: Once | INTRAVENOUS | Status: AC | PRN
  Administered 2013-05-08: 100 mL via INTRAVENOUS

## 2013-05-08 MED ORDER — ESMOLOL HCL-SODIUM CHLORIDE 2000 MG/100ML IV SOLN
25.0000 ug/kg/min | Freq: Once | INTRAVENOUS | Status: DC
  Filled 2013-05-08: qty 100

## 2013-05-08 MED ORDER — LABETALOL HCL 5 MG/ML IV SOLN
10.0000 mg | INTRAVENOUS | Status: DC | PRN
  Administered 2013-05-08 (×2): 10 mg via INTRAVENOUS
  Filled 2013-05-08: qty 4

## 2013-05-08 MED ORDER — LABETALOL HCL 5 MG/ML IV SOLN
20.0000 mg | INTRAVENOUS | Status: DC | PRN
  Administered 2013-05-08: 10 mg via INTRAVENOUS
  Administered 2013-05-10 – 2013-05-11 (×2): 20 mg via INTRAVENOUS
  Administered 2013-05-12: 10 mg via INTRAVENOUS
  Administered 2013-05-13: 20 mg via INTRAVENOUS
  Filled 2013-05-08 (×5): qty 4

## 2013-05-08 MED ORDER — ONDANSETRON HCL 4 MG PO TABS: 4.0000 mg | ORAL_TABLET | Freq: Four times a day (QID) | ORAL | Status: DC | PRN

## 2013-05-08 MED ORDER — ACETAMINOPHEN 325 MG PO TABS
650.0000 mg | ORAL_TABLET | Freq: Four times a day (QID) | ORAL | Status: DC | PRN
  Administered 2013-05-08 – 2013-05-13 (×11): 650 mg via ORAL
  Filled 2013-05-08 (×12): qty 2

## 2013-05-08 NOTE — Clinical Documentation Improvement (Signed)
THIS DOCUMENT IS NOT A PERMANENT PART OF THE MEDICAL RECORD  Please update your documentation with the medical record to reflect your response to this query. If you need help knowing how to do this please call [phone].  05/08/13  Dr. Donata Clay,  In an effort to better capture your patient's severity of illness, reflect appropriate length of stay and utilization of resources, a review of the patient medical record has revealed the following information:    - "Admitted for CT scan evidence of a recurrent small bleed into the thrombosed false lumen of the descending thoracic aorta without extension.  Etiology of this is probably related to her hypertension and Coumadin anticoagulation" per H&P     - "Her blood pressure control and medications will be optimized while she is in the hospital. We will repeat the 2-D echocardiogram." per H&P   - Systolic BP's 160's to 170's in ED per review of Doc Flowsheets   - Started on Nipride gtt. Per H&P    - Admitted to Surgical intensive Care for continuation of IV Nipride and blood pressure monitoring   Based on your clinical judgment, please document in the progress notes and discharge summary if a condition below provides greater specificity regarding the patient's hypertension and treatment provided:   - Accelerated Hypertension   - Other Condition   - Unable to Clinically Determine   In responding to this query please exercise your independent judgment.    The fact that a query is asked, does not imply that any particular answer is desired or expected.   Reviewed: 05/13/13 - documented as "uncontrolled htn" in prelim dc summary by donielle zimmerman.  Mathis Dad.  Thank You,  Jerral Ralph  RN BSN CCDS Certified Clinical Documentation Specialist: [phone] Health Information Management [place]

## 2013-05-08 NOTE — Progress Notes (Signed)
Utilization Review Completed.Rachele Lamaster T9/25/2014  

## 2013-05-08 NOTE — Progress Notes (Signed)
PM ROUNDS  Admitted with limited extension of aortic dissection  BP 140/73  Pulse 60  Temp(Src) 97.6 F (36.4 C) (Oral)  Resp 26  Ht 5' 1.02" (1.55 m)  Wt 130 lb 1.1 oz (59 kg)  BMI 24.56 kg/m2  SpO2 92%  BP has been less than 130 systolic until last reading  Pain improved  Continue current care

## 2013-05-08 NOTE — ED Notes (Signed)
Patient is resting comfortably. 

## 2013-05-08 NOTE — ED Notes (Signed)
MD at bedside. 

## 2013-05-08 NOTE — ED Provider Notes (Addendum)
CSN: [id number]     Arrival date & time 05/07/13  2052 History   First MD Initiated Contact with Patient 05/08/13 0000     Chief Complaint  Patient presents with  . Back Pain    Patient is a 79 y.o. female presenting with back pain.  Back Pain Associated symptoms: no abdominal pain, no chest pain, no fever, no numbness and no weakness    Pt has been having pain in her back for the last three days.  The pain is in the left shoulder blade.  It is constant and has not resolved.  She has tried taking tylenol but it has not helped.  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    Pt saw her surgeon today and was told to try taking tylenol but that has not helped. The pain seems to stay in the same spot.  It does not radiate.  The pain does not increase with movement.  Today it was very severe and concerned her.  Past Medical History  Diagnosis Date  . Afib   . Aortic root aneurysm   . Hypertension   . CHF (congestive heart failure)   . Shortness of breath    Past Surgical History  Procedure Laterality Date  . Bentall procedure N/A 11/11/2012    Procedure: BENTALL PROCEDURE;  Surgeon: Kerin Perna, MD;  Location: [facility] OR;  Service: Open Heart Surgery;  Laterality: N/A;  *CIRC ARREST*   . Intraoperative transesophageal echocardiogram N/A 11/11/2012    Procedure: INTRAOPERATIVE TRANSESOPHAGEAL ECHOCARDIOGRAM;  Surgeon: Kerin Perna, MD;  Location: [facility] OR;  Service: Open Heart Surgery;  Laterality: N/A;  . Ascending aortic aneurysm repair  10/2012   History reviewed. No pertinent family history. History  Substance Use Topics  . Smoking status: Never Smoker   . Smokeless tobacco: Never Used  . Alcohol Use: No   OB History   Grav Para Term Preterm Abortions TAB SAB Ect Mult Living                 Review of Systems  Constitutional: Negative for fever.  Respiratory: Negative for cough.   Cardiovascular: Negative for chest pain.  Gastrointestinal:  Negative for abdominal pain.  Endocrine: Negative for polyuria.  Musculoskeletal: Positive for back pain.  Skin: Negative for rash.  Neurological: Negative for weakness and numbness.  All other systems reviewed and are negative.    Allergies  Review of patient's allergies indicates no known allergies.  Home Medications   Current Outpatient Rx  Name  Route  Sig  Dispense  Refill  . amiodarone (PACERONE) 200 MG tablet   Oral   Take 200 mg by mouth daily.         . Ascorbic Acid (VITAMIN C) 1000 MG tablet   Oral   Take 1,000 mg by mouth daily.         . digoxin (LANOXIN) 0.125 MG tablet   Oral   Take 1 tablet (0.125 mg total) by mouth daily.   30 tablet   0   . furosemide (LASIX) 40 MG tablet   Oral   Take 1 tablet (40 mg total) by mouth daily.   30 tablet   1   . lisinopril (PRINIVIL,ZESTRIL) 2.5 MG tablet   Oral   Take 1 tablet (2.5 mg total) by mouth daily.   30 tablet   1   . metoprolol (LOPRESSOR) 50 MG tablet   Oral  Take 1 tablet (50 mg total) by mouth 2 (two) times daily.   60 tablet   0   . potassium chloride SA (K-DUR,KLOR-CON) 20 MEQ tablet   Oral   Take 20 mEq by mouth daily.         Marland Kitchen warfarin (COUMADIN) 5 MG tablet   Oral   Take 1 tablet (5 mg total) by mouth one time only at 6 PM.   15 tablet   0    BP 164/77  Pulse 68  Temp(Src) 98.1 F (36.7 C) (Oral)  Resp 18  Ht 5' 1.02" (1.55 m)  Wt 130 lb 1.1 oz (59 kg)  BMI 24.56 kg/m2  SpO2 93% Physical Exam  Nursing note and vitals reviewed. Constitutional: She appears well-developed and well-nourished. No distress.  HENT:  Head: Normocephalic and atraumatic.  Right Ear: External ear normal.  Left Ear: External ear normal.  Eyes: Conjunctivae are normal. Right eye exhibits no discharge. Left eye exhibits no discharge. No scleral icterus.  Neck: Neck supple. No tracheal deviation present.  Cardiovascular: Normal rate, regular rhythm and intact distal pulses.   Pulses:       Radial pulses are 2+ on the right side, and 2+ on the left side.       Femoral pulses are 2+ on the right side, and 2+ on the left side. Pulmonary/Chest: Effort normal and breath sounds normal. No stridor. No respiratory distress. She has no wheezes. She has no rales.  Abdominal: Soft. Bowel sounds are normal. She exhibits no distension. There is no tenderness. There is no rebound and no guarding.  Musculoskeletal: She exhibits no edema and no tenderness.       Arms: ttp periscapular region left shoulder, no pain with movement of left arm  Neurological: She is alert. She has normal strength. No sensory deficit. Cranial nerve deficit:  no gross defecits noted. She exhibits normal muscle tone. She displays no seizure activity. Coordination normal.  Skin: Skin is warm and dry. No rash noted.  Psychiatric: She has a normal mood and affect.    ED Course  Procedures (including critical care time) EKG atrial fibrillation rate 61 Q waves inferiorly and anteriorly Nonspecific ST-T wave changes When compared to prior EKG H. fibrillation was noted back in 03/06/2013 EKG is similar in appearance except for a slower rate Labs Review  CRITICAL CARE Performed by: Linwood Dibbles R Total critical care time: 45 Critical care time was exclusive of separately billable procedures and treating other patients. Critical care was necessary to treat or prevent imminent or life-threatening deterioration. Critical care was time spent personally by me on the following activities: development of treatment plan with patient and/or surrogate as well as nursing, discussions with consultants, evaluation of patient's response to treatment, examination of patient, obtaining history from patient or surrogate, ordering and performing treatments and interventions, ordering and review of laboratory studies, ordering and review of radiographic studies, pulse oximetry and re-evaluation of patient's condition.  Labs Reviewed  CBC -  Abnormal; Notable for the following:    RDW 16.7 (*)    All other components within normal limits  COMPREHENSIVE METABOLIC PANEL - Abnormal; Notable for the following:    Glucose, Bld 140 (*)    Albumin 3.4 (*)    GFR calc non Af Amer 61 (*)    GFR calc Af Amer 70 (*)    All other components within normal limits  PROTIME-INR - Abnormal; Notable for the following:    Prothrombin Time 20.5 (*)  INR 1.82 (*)    All other components within normal limits  APTT  POCT I-STAT TROPONIN I   Imaging Review Ct Angio Chest W/cm &/or Wo Cm  05/08/2013   CLINICAL DATA:  Back pain  EXAM: CT ANGIOGRAPHY CHEST WITH CONTRAST  TECHNIQUE: Multidetector CT imaging of the chest was performed using the standard protocol during bolus administration of intravenous contrast. Multiplanar CT image reconstructions including MIPs were obtained to evaluate the vascular anatomy.  CONTRAST:  OMNIPAQUE IOHEXOL 350 MG/ML SOLN  COMPARISON:  04/28/2013  FINDINGS: THORACIC INLET/BODY WALL:  No acute abnormality.  MEDIASTINUM:  There is new circumferential intramural high density extending from the level of an ascending aortic graft to the mid descending thoracic aorta, at the level of the left lower lobe bronchus. The intramural/subintimal hematoma measures up to 7 mm in thickness at its midpoint. Entry point present along the lower anterior margin of the the aorta 3 cm beyond the left subclavian origin. The great vessels remain patent and not narrowed. There is a fusiform descending thoracic aortic aneurysm which is similar at size at 5.4 cm maximal dimension. As before there is luminal thrombus along the outer wall of the descending thoracic aorta.  Status post ascending aorta and aortic root replacement. The graft appears unchanged. No change in cardiomegaly. No pericardial effusion.  LUNG WINDOWS:  Patchy air trapping, mainly in the upper lungs. There is a small, low-density left pleural effusion. Chronic atelectasis present  around the descending aortic aneurysm.  UPPER ABDOMEN:  No acute findings.  OSSEOUS:  No acute fracture.  No suspicious lytic or blastic lesions.  CriticalValue/emergent results were called by telephone at the time of interpretation on 05/08/2013 at 4:58 [facility] , who verbally acknowledged these results.  Review of the MIP images confirms the above findings.  IMPRESSION: 1. Acute aortic syndrome with intramural hemorrhage extending from the ascending aortic graft to the mid thoracic aorta. The entry point is an ulcer located along the lower anterior aorta 3 cm beyond the left subclavian origin. No great vessel origin narrowing. 2. Size stable descending thoracic aortic aneurysm at 5.4 cm. 3. Stable appearance of aortic root and ascending aorta repair.   Electronically Signed   By: Tiburcio Pea   On: 05/08/2013 05:10   0445 I was called with the results of her CT scan that was delayed by the fact that she required a second IV placement.  Pt has remained stable and generally asymptomatic at this time.  Thoracic  Surgery was consulted.  Dr Tyrone Sage will speak with Dr Morton Peters.  Pt will require admission.  Pt was given dose of lopressor for her BP.  Will plan on esmolol and nipride drip and close monitoring. Kcentra ordered for coumadin reversal.  INR 1.89 MDM   1. Aortic dissection    Pt had a recent CT scan within the last week that showed stable postoperative changes.  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  Plan on hospitalization for further treatment.    Celene Kras, MD 05/08/13 [id number]  Celene Kras, MD 05/08/13 [phone]

## 2013-05-08 NOTE — H&P (Signed)
[address]       [place] [address]             [phone]        [facility] Medical Record #[id number] Date of Birth: 08-28-1927  Referring: No ref. provider found Primary Care: Emeterio Reeve, MD  Chief Complaint:    Chief Complaint  Patient presents with  . Back Pain    History of Present Illness:     The patient was examined in the ED and CT. of the thoracic aorta reviewed and compared with most recent scan.  Patient is an 79 -year-old hypertensive [place] female 6 months status post repair of a subacute tvpe A acsending thoracic dissection utilizing a 30 mm heme shield straight graft resuspension of aortic valve using hypothermic circulatory arrest. The patient had hematoma-thrombosis of the false lumen which continued down the descending thoracic aorta. The patient has history of chronic atrial fibrillation and was carefully started back on Coumadin after 3 months following surgery. The patient had a six-month postop CT scan which showed an intact repair of the ascending aorta and aortic root with thrombosis of the false lumen of the descending thoracic aorta. The patient was seen within the past 24 hours for routine office visit and complained of mild neck pain, not back pain, that sounded like arthritis and Tylenol was recommended.  The patient presented to the ED this morning with increased severity of the lower neck and now upper back pain associated with hypertension blood pressure 180 systolic. A repeat CT scan showed a new small area of contrast in the descending thoracic aortic false lumen-hematoma  -- this is approximately  7 mm in length and 2-3 mm in width in a circumferential pattern, 3 cm distal to the takeoff of the left subclavian. The ascending aortic graft and repair is intact. There is no free pleural effusion or pericardial effusion. The diameter of the descending thoracic aorta is stable at 5.2 cm including both the true lumen  and thrombosed false lumen.  The patient was started on nitride drip, given 1 dose of IV Lopressor and 1 dose of IV morphine and is now comfortable in the ED without pain. EKG shows chronic atrial fibrillation. She was given vitamin K concentrate to reverse her INR of 1.8.  The patient did not have a coronary arteriogram prior to her emergent type a dissection repair and a postoperative echo showed evidence of reduced LV function, anterior wall hypokinesia. She was hospitalized for symptomatic heart failure approximately 2 months after surgery but has been doing very well on medical therapy since that time. Repeat 2-D echo is pending. Cardiac enzymes are negative this morning and her EKG shows stable chronic Q waves anteriorly.   Curr curvatur coronary ent Activity/ Functional Status: Patient is sedentary and lives with her daughter.   Zubrod Score: At the time of surgery this patient's most appropriate activity status/level should be described as: []  Normal activity, no symptoms []  Symptoms, fully ambulatory [x]  Symptoms, in bed less than or equal to 50% of the time []  Symptoms, in bed greater than 50% of the time but less than 100% []  Bedridden []  Moribund  Past Medical History  Diagnosis Date  . Afib   . Aortic root aneurysm   . Hypertension   . CHF (congestive heart failure)   . Shortness of breath     Past Surgical History  Procedure Laterality Date  . [place] procedure N/A 11/11/2012  Procedure: BENTALL PROCEDURE;  Surgeon: Kerin Perna, MD;  Location: [facility] OR;  Service: Open Heart Surgery;  Laterality: N/A;  *CIRC ARREST*   . Intraoperative transesophageal echocardiogram N/A 11/11/2012    Procedure: INTRAOPERATIVE TRANSESOPHAGEAL ECHOCARDIOGRAM;  Surgeon: Kerin Perna, MD;  Location: [facility] OR;  Service: Open Heart Surgery;  Laterality: N/A;  . Ascending aortic aneurysm repair  10/2012    History  Smoking status  . Never Smoker   Smokeless tobacco  . Never Used    History  Alcohol Use No    History   Social History  . Marital Status: Unknown    Spouse Name: N/A    Number of Children: 2  . Years of Education: N/A   Occupational History  . Not on file.   Social History Main Topics  . Smoking status: Never Smoker   . Smokeless tobacco: Never Used  . Alcohol Use: No  . Drug Use: No  . Sexual Activity: No   Other Topics Concern  . Not on file   Social History Narrative   From [place], [place]    No Known Allergies  Current Facility-Administered Medications  Medication Dose Route Frequency Provider Last Rate Last Dose  . 0.45 % NaCl with KCl 20 mEq / L infusion   Intravenous Continuous Kerin Perna, MD      . 0.9 %  sodium chloride infusion  1,000 mL Intravenous Continuous Celene Kras, MD 20 mL/hr at 05/08/13 0751 1,000 mL at 05/08/13 0751  . acetaminophen (TYLENOL) tablet 650 mg  650 mg Oral Q6H PRN Kerin Perna, MD      . alum & mag hydroxide-simeth (MAALOX/MYLANTA) 200-200-20 MG/5ML suspension 30 mL  30 mL Oral Q6H PRN Kerin Perna, MD      . docusate sodium (COLACE) capsule 100 mg  100 mg Oral BID Kerin Perna, MD      . fentaNYL (SUBLIMAZE) injection 25 mcg  25 mcg Intravenous Q2H PRN Kerin Perna, MD      . labetalol (NORMODYNE,TRANDATE) injection 10 mg  10 mg Intravenous Q2H PRN Kerin Perna, MD      . nitroPRUSSide (NIPRIDE) 50 mg in dextrose 5 % 250 mL infusion  0.25-0.5 mcg/kg/min Intravenous Once Kerin Perna, MD      . ondansetron [facility]) tablet 4 mg  4 mg Oral Q6H PRN Kerin Perna, MD       Or  . ondansetron [facility]) injection 4 mg  4 mg Intravenous Q6H PRN Kerin Perna, MD      . pantoprazole (PROTONIX) EC tablet 40 mg  40 mg Oral Q1200 Kerin Perna, MD      . phytonadione (VITAMIN K) 10 mg in dextrose 5 % 50 mL IVPB  10 mg Intravenous STAT Celene Kras, MD      . sodium chloride 0.9 % injection 3 mL  3 mL Intravenous Q12H Kerin Perna, MD      . sorbitol 70 % solution 30 mL  30 mL Oral  Daily PRN Kerin Perna, MD      . traMADol Janean Sark) tablet 50 mg  50 mg Oral Q6H PRN Kerin Perna, MD       Current Outpatient Prescriptions  Medication Sig Dispense Refill  . amiodarone (PACERONE) 200 MG tablet Take 200 mg by mouth daily.      . Ascorbic Acid (VITAMIN C) 1000 MG tablet Take 1,000 mg by mouth daily.      Marland Kitchen  digoxin (LANOXIN) 0.125 MG tablet Take 1 tablet (0.125 mg total) by mouth daily.  30 tablet  0  . furosemide (LASIX) 40 MG tablet Take 1 tablet (40 mg total) by mouth daily.  30 tablet  1  . lisinopril (PRINIVIL,ZESTRIL) 2.5 MG tablet Take 1 tablet (2.5 mg total) by mouth daily.  30 tablet  1  . metoprolol (LOPRESSOR) 50 MG tablet Take 1 tablet (50 mg total) by mouth 2 (two) times daily.  60 tablet  0  . potassium chloride SA (K-DUR,KLOR-CON) 20 MEQ tablet Take 20 mEq by mouth daily.      Marland Kitchen warfarin (COUMADIN) 5 MG tablet Take 1 tablet (5 mg total) by mouth one time only at 6 PM.  15 tablet  0     (Not in a hospital admission)  History reviewed. No pertinent family history.   Review of Systems:     Cardiac Review of Systems: Y or N  Chest Pain [  Y.  ]  Resting SOB [N.   ] Exertional SOB  [ Y. ]  Orthopnea Klaus.Mock.  ]   Pedal Edema [ Y.    ]    Palpitations [   and ] Syncope  [ N.]   Presyncope [Y.   ]  General Review of Systems: [Y] = yes [  ]=no Constitional: recent weight change [ in   ]; anorexia [  ]; fatigue [  ]; nausea [  ]; night sweats [  ]; fever [  ]; or chills [  ]                                                               Dental: poor dentition[  ]; Last Dentist visit:one year    Eye : blurred vision [  ]; diplopia [   ]; vision changes [  ];  Amaurosis fugax[  ]; Resp: cough [  ];  wheezing[  ];  hemoptysis[  ]; shortness of breath[  ]; paroxysmal nocturnal dyspnea[  ]; dyspnea on exertion[  ]; or orthopnea[  ];  GI:  gallstones[  ], vomiting[  ];  dysphagia[  ]; melena[  ];  hematochezia [  ]; heartburn[  ];   Hx of  Colonoscopy[  ]; GU: kidney  stones [  ]; hematuria[  ];   dysuria [  ];  nocturia[  ];  history of     obstruction [  ]; urinary frequency [  ]             Skin: rash, swelling[  Y.  ];, hair loss[  ];  peripheral edema[  ];  or itching[  ]; Musculosketetal: myalgias[  ];  joint swelling[  ];  joint erythema[  ];  joint pain[  ];  back pain[ Y.  ];  Heme/Lymph: bruising[  ];  bleeding[   in ];  anemia[  ];  Neuro: TIA[  ];  headaches[  ];  stroke[  ];  vertigo[  ];  seizures[  ];   paresthesias[  ];  difficulty walking[  ];  Psych:depression[  ]; anxiety[  ];  Endocrine: diabetes[  ];  thyroid dysfunction[  ];  Immunizations: Flu [  ]; Pneumococcal[  ];  Other:  Physical Exam: BP 142/79  Pulse 69  Temp(Src) 98.1 F (36.7 C) (Oral)  Resp 27  Ht 5' 1.02" (1.55 m)  Wt 130 lb 1.1 oz (59 kg)  BMI 24.56 kg/m2  SpO2 93%  Physical exam   general - elderly Caucasian female in the ED comfortable no distress   HEENT-pupils equal normocephalic dentition good Neck-no JVD Or adenopathy, carotid pulsations normal bilaterally Thorax-well-healed sternal incision breath sounds clear and equal, chest nontender without deformity Cardiac atrial fibrillation no murmur or gallop Abdomen-soft-nontender no palpable pulsatile mass Extremities - warm nontender no cyanosis mild pedal edema   neurologic-responsive no focal motor deficit   Diagnostic Studies & Laboratory data:     Recent Radiology Findings:   Ct Angio Chest W/cm &/or Wo Cm  05/08/2013   CLINICAL DATA:  Back pain  EXAM: CT ANGIOGRAPHY CHEST WITH CONTRAST  TECHNIQUE: Multidetector CT imaging of the chest was performed using the standard protocol during bolus administration of intravenous contrast. Multiplanar CT image reconstructions including MIPs were obtained to evaluate the vascular anatomy.  CONTRAST:  OMNIPAQUE IOHEXOL 350 MG/ML SOLN  COMPARISON:  04/28/2013  FINDINGS: THORACIC INLET/BODY WALL:  No acute abnormality.  MEDIASTINUM:  There is new circumferential  intramural high density extending from the level of an ascending aortic graft to the mid descending thoracic aorta, at the level of the left lower lobe bronchus. The intramural/subintimal hematoma measures up to 7 mm in thickness at its midpoint. Entry point present along the lower anterior margin of the the aorta 3 cm beyond the left subclavian origin. The great vessels remain patent and not narrowed. There is a fusiform descending thoracic aortic aneurysm which is similar at size at 5.4 cm maximal dimension. As before there is luminal thrombus along the outer wall of the descending thoracic aorta.  Status post ascending aorta and aortic root replacement. The graft appears unchanged. No change in cardiomegaly. No pericardial effusion.  LUNG WINDOWS:  Patchy air trapping, mainly in the upper lungs. There is a small, low-density left pleural effusion. Chronic atelectasis present around the descending aortic aneurysm.  UPPER ABDOMEN:  No acute findings.  OSSEOUS:  No acute fracture.  No suspicious lytic or blastic lesions.  CriticalValue/emergent results were called by telephone at the time of interpretation on 05/08/2013 at 4:58 [facility] , who verbally acknowledged these results.  Review of the MIP images confirms the above findings.  IMPRESSION: 1. Acute aortic syndrome with intramural hemorrhage extending from the ascending aortic graft to the mid thoracic aorta. The entry point is an ulcer located along the lower anterior aorta 3 cm beyond the left subclavian origin. No great vessel origin narrowing. 2. Size stable descending thoracic aortic aneurysm at 5.4 cm. 3. Stable appearance of aortic root and ascending aorta repair.   Electronically Signed   By: Tiburcio Pea   On: 05/08/2013 05:10      Recent Lab Findings: Lab Results  Component Value Date   WBC 10.4 05/08/2013   HGB 13.6 05/08/2013   HCT 41.9 05/08/2013   PLT 293 05/08/2013   GLUCOSE 140* 05/08/2013   ALT 16 05/08/2013   AST 16 05/08/2013    NA 139 05/08/2013   K 4.2 05/08/2013   CL 103 05/08/2013   CREATININE 0.86 05/08/2013   BUN 15 05/08/2013   CO2 23 05/08/2013   TSH 0.706 11/08/2012   INR 1.82* 05/08/2013      Assessment / Plan:      Patient admitted with upper back pain and CT scan evidence of a  recurrent small bleed into the thrombosed false lumen of the descending thoracic aorta without extension. The previously repaired ascending aorta is intact. Etiology of this is probably related to her hypertension and Coumadin anticoagulation.  We will reverse her Coumadin and the patient's atrial fibrillation will need to be managed with aspirin alone when she recovers from the current event. Her blood pressure control and medications will be optimized while she is in the hospital. We will repeat the 2-D echocardiogram.  The patient is not a surgical candidate for replacement or stent grafting of her descending thoracic aorta and will be managed conservatively. She'll be admitted the ICU for continuation of IV nipride and blood pressure monitoring.     @ME1 @ 05/08/2013 8:51 AM

## 2013-05-08 NOTE — ED Notes (Signed)
Pt placed on 2L 02, pt's 02 sats reading 90%.

## 2013-05-09 ENCOUNTER — Inpatient Hospital Stay (HOSPITAL_COMMUNITY): Payer: Medicare PPO

## 2013-05-09 DIAGNOSIS — I359 Nonrheumatic aortic valve disorder, unspecified: Secondary | ICD-10-CM

## 2013-05-09 LAB — CBC
HCT: 41.9 % (ref 36.0–46.0)
Hemoglobin: 13.5 g/dL (ref 12.0–15.0)
MCH: 28.3 pg (ref 26.0–34.0)
MCHC: 32.2 g/dL (ref 30.0–36.0)
MCV: 87.8 fL (ref 78.0–100.0)
Platelets: 271 10*3/uL (ref 150–400)
RBC: 4.77 MIL/uL (ref 3.87–5.11)
RDW: 16.8 % — ABNORMAL HIGH (ref 11.5–15.5)
WBC: 10 10*3/uL (ref 4.0–10.5)

## 2013-05-09 LAB — COMPREHENSIVE METABOLIC PANEL
ALT: 14 U/L (ref 0–35)
AST: 14 U/L (ref 0–37)
Albumin: 3 g/dL — ABNORMAL LOW (ref 3.5–5.2)
Alkaline Phosphatase: 72 U/L (ref 39–117)
BUN: 11 mg/dL (ref 6–23)
CO2: 25 mEq/L (ref 19–32)
Calcium: 8.8 mg/dL (ref 8.4–10.5)
Chloride: 97 mEq/L (ref 96–112)
Creatinine, Ser: 0.9 mg/dL (ref 0.50–1.10)
GFR calc Af Amer: 67 mL/min — ABNORMAL LOW (ref >90)
GFR calc non Af Amer: 58 mL/min — ABNORMAL LOW (ref >90)
Glucose, Bld: 139 mg/dL — ABNORMAL HIGH (ref 70–99)
Potassium: 3.9 mEq/L (ref 3.5–5.1)
Sodium: 134 mEq/L — ABNORMAL LOW (ref 135–145)
Total Bilirubin: 2 mg/dL — ABNORMAL HIGH (ref 0.3–1.2)
Total Protein: 7.3 g/dL (ref 6.0–8.3)

## 2013-05-09 LAB — PROTIME-INR
INR: 1.16 (ref 0.00–1.49)
Prothrombin Time: 14.6 seconds (ref 11.6–15.2)

## 2013-05-09 MED ORDER — LISINOPRIL 20 MG PO TABS
20.0000 mg | ORAL_TABLET | Freq: Every day | ORAL | Status: DC
  Administered 2013-05-09 – 2013-05-16 (×8): 20 mg via ORAL
  Filled 2013-05-09 (×8): qty 1

## 2013-05-09 NOTE — Progress Notes (Addendum)
      [address]       [name] [address]             [phone]      Subjective:   [name] complains of a headache this morning.  She denies chest, back and abdominal pain.    Objective: Vital signs in last 24 hours: Temp:  [97.4 F (36.3 C)-98.9 F (37.2 C)] 98.7 F (37.1 C) (09/26 0757) Pulse Rate:  [54-78] 70 (09/26 0700) Cardiac Rhythm:  [-] Atrial fibrillation (09/26 0700) Resp:  [11-33] 30 (09/26 0700) BP: (81-157)/(52-106) 120/72 mmHg (09/26 0700) SpO2:  [89 %-98 %] 94 % (09/26 0700) Weight:  [135 lb 8 oz (61.462 kg)] 135 lb 8 oz (61.462 kg) (09/26 0500)  Intake/Output from previous day: 09/25 0701 - 09/26 0700 In: 1214.9 [P.O.:120; I.V.:1094.9] Out: 60 [Urine:60]  General appearance: alert, cooperative and no distress Heart: regular rate and rhythm Lungs: clear to auscultation bilaterally Abdomen: soft, non-tender; bowel sounds normal; no masses,  no organomegaly Extremities: extremities normal, atraumatic, no cyanosis or edema  Lab Results:  Recent Labs  05/08/13 0024 05/09/13 0415  WBC 10.4 10.0  HGB 13.6 13.5  HCT 41.9 41.9  PLT 293 271   BMET:  Recent Labs  05/08/13 0024 05/09/13 0415  NA 139 134*  K 4.2 3.9  CL 103 97  CO2 23 25  GLUCOSE 140* 139*  BUN 15 11  CREATININE 0.86 0.90  CALCIUM 8.6 8.8    PT/INR:  Recent Labs  05/09/13 0415  LABPROT 14.6  INR 1.16   ABG    Component Value Date/Time   PHART 7.434 11/13/2012 0554   HCO3 26.4* 11/13/2012 0554   TCO2 28 11/13/2012 0554   ACIDBASEDEF 2.0 11/12/2012 0743   O2SAT 94.0 11/13/2012 0554   CBG (last 3)  No results found for this basename: GLUCAP,  in the last 72 hours  Assessment/Plan:  1. CV- NSR, Blood pressure controlled- patient placed back on NTG overnight for better blood pressure control- will increase Lisinopril to 20 mg, continue Lopressor, can add Norvasc if necessary 2. Pulm- no issues 3. Renal- creatinine WNL 4. 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 VAN TRIGT III,Nalin Mazzocco 05/10/2013

## 2013-05-09 NOTE — Progress Notes (Signed)
Echocardiogram 2D Echocardiogram has been performed.  05/09/2013 4:15 PM Gertie Fey, RVT, RDCS, RDMS

## 2013-05-09 NOTE — Progress Notes (Signed)
Chaplain received referral from RN during morning rounds. Patient's daughter requested spiritual support and prayer. Chaplain visited with patient and daughter; Patient was sitting up in bed. Patient and family moved to the [place] from [place] in the late 70s. Patient and daughter were very religious, "nondenominational," and requested prayer. Chaplain offered prayer, emotional support for daughter as caregiver, and active listening.  Maurene Capes, Chaplain

## 2013-05-10 ENCOUNTER — Inpatient Hospital Stay (HOSPITAL_COMMUNITY): Payer: Medicare PPO

## 2013-05-10 DIAGNOSIS — I7101 Dissection of thoracic aorta: Secondary | ICD-10-CM

## 2013-05-10 LAB — BASIC METABOLIC PANEL
BUN: 18 mg/dL (ref 6–23)
CO2: 24 mEq/L (ref 19–32)
Calcium: 8.5 mg/dL (ref 8.4–10.5)
Chloride: 100 mEq/L (ref 96–112)
Creatinine, Ser: 1.03 mg/dL (ref 0.50–1.10)
GFR calc Af Amer: 57 mL/min — ABNORMAL LOW (ref >90)
GFR calc non Af Amer: 49 mL/min — ABNORMAL LOW (ref >90)
Glucose, Bld: 133 mg/dL — ABNORMAL HIGH (ref 70–99)
Potassium: 3.8 mEq/L (ref 3.5–5.1)
Sodium: 136 mEq/L (ref 135–145)

## 2013-05-10 LAB — CBC
HCT: 38.5 % (ref 36.0–46.0)
Hemoglobin: 12.7 g/dL (ref 12.0–15.0)
MCH: 28.9 pg (ref 26.0–34.0)
MCHC: 33 g/dL (ref 30.0–36.0)
MCV: 87.5 fL (ref 78.0–100.0)
Platelets: 257 10*3/uL (ref 150–400)
RBC: 4.4 MIL/uL (ref 3.87–5.11)
RDW: 16.9 % — ABNORMAL HIGH (ref 11.5–15.5)
WBC: 10.3 10*3/uL (ref 4.0–10.5)

## 2013-05-10 LAB — PROTIME-INR
INR: 1.21 (ref 0.00–1.49)
Prothrombin Time: 15 seconds (ref 11.6–15.2)

## 2013-05-10 NOTE — Progress Notes (Signed)
  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 Chronic atrial fibrillation on chronic Coumadin-now discontinued due to bleeding in false lumen-hematoma Blood pressure well controlled now, chest pain resolved 2-D echocardiogram shows improved global LV function EF 35% with trivial AI Plan on mobilizing patient out of bed, continue ICU management of descending thoracic intramural hematoma Objective: Vital signs in last 24 hours: Temp:  [97.5 F (36.4 C)-98.8 F (37.1 C)] 97.5 F (36.4 C) (09/27 0802) Pulse Rate:  [59-78] 77 (09/27 0900) Cardiac Rhythm:  [-] Atrial fibrillation (09/27 0900) Resp:  [0-29] 22 (09/27 0900) BP: (94-158)/(52-99) 128/76 mmHg (09/27 0900) SpO2:  [90 %-99 %] 92 % (09/27 0900) FiO2 (%):  [50 %] 50 % (09/27 0700) Weight:  [135 lb 12.9 oz (61.6 kg)] 135 lb 12.9 oz (61.6 kg) (09/27 0500)  Hemodynamic parameters for last 24 hours:   atrial fibrillation blood pressure stable  Intake/Output from previous day: 09/26 0701 - 09/27 0700 In: 1210 [P.O.:423; I.V.:787] Out: 1125 [Urine:1125] Intake/Output this shift: Total I/O In: 140 [P.O.:120; I.V.:20] Out: 150 [Urine:150]  Patient comfortable Lungs clear No murmur Mild chronic pedal edema  Lab Results: Chest x-ray shows no significant pleural effusion  Recent Labs  05/09/13 0415 05/10/13 0347  WBC 10.0 10.3  HGB 13.5 12.7  HCT 41.9 38.5  PLT 271 257   BMET:  Recent Labs  05/09/13 0415 05/10/13 0347  NA 134* 136  K 3.9 3.8  CL 97 100  CO2 25 24  GLUCOSE 139* 133*  BUN 11 18  CREATININE 0.90 1.03  CALCIUM 8.8 8.5    PT/INR:  Recent Labs  05/10/13 0347  LABPROT 15.0  INR 1.21   ABG    Component Value Date/Time   PHART 7.434 11/13/2012 0554   HCO3 26.4* 11/13/2012 0554   TCO2 28 11/13/2012 0554   ACIDBASEDEF 2.0  11/12/2012 0743   O2SAT 94.0 11/13/2012 0554   CBG (last 3)  No results found for this basename: GLUCAP,  in the last 72 hours  Assessment/Plan: S/P   With recurrent bleeding into false lumen 6 months after type a dissection will need to stop Coumadin and aspirin Blood pressure needs to be better controlled-patient on increased dose of lisinopril -- 20 mg daily and Lopressor 50 2 times a day Continue to monitor carefully in ICU--patient not a candidate for surgical intervention of her descending aorta. This was discussed with patient's family.  LOS: 3 days    [name],[name] 05/10/2013

## 2013-05-10 NOTE — Evaluation (Signed)
Physical Therapy Evaluation Patient Details Name: [name] MRN: [id number] DOB: 1927/12/25 Today's Date: 05/10/2013 Time: 0865-7846 PT Time Calculation (min): 18 min  PT Assessment / Plan / Recommendation History of Present Illness  pt presents with descending thoracic aorta hematoma and is not a surgical candidate at this time.    Clinical Impression  Pt agreeable to ambulate, though feel with language barrier pt was not understanding every question PT asked.  Will continue to follow.      PT Assessment  Patient needs continued PT services    Follow Up Recommendations  Home health PT;Supervision - Intermittent    Does the patient have the potential to tolerate intense rehabilitation      Barriers to Discharge        Equipment Recommendations  None recommended by PT    Recommendations for Other Services     Frequency Min 3X/week    Precautions / Restrictions Precautions Precautions: Fall Restrictions Weight Bearing Restrictions: No   Pertinent Vitals/Pain Denied pain.        Mobility  Bed Mobility Bed Mobility: Supine to Sit;Sitting - Scoot to [place] of Bed;Sit to Supine Supine to Sit: 4: Min assist Sitting - Scoot to Edge of Bed: 5: Supervision Sit to Supine: 4: Min assist Details for Bed Mobility Assistance: A to bring trunk up to sitting and to bring LEs into bed.   Transfers Transfers: Sit to Stand;Stand to Sit Sit to Stand: 4: Min guard;With upper extremity assist;From bed;From chair/3-in-1 Stand to Sit: 4: Min guard;With upper extremity assist;To chair/3-in-1;To bed Details for Transfer Assistance: pt demos good use of UEs.   Ambulation/Gait Ambulation/Gait Assistance: 4: Min assist Ambulation Distance (Feet): 200 Feet Assistive device: None Ambulation/Gait Assistance Details: pt mildly unsteady with occasional side steps when turning head and externally distracted.   Gait Pattern: Step-through pattern;Decreased stride length Stairs: No Wheelchair  Mobility Wheelchair Mobility: No    Exercises     PT Diagnosis: Difficulty walking  PT Problem List: Decreased strength;Decreased activity tolerance;Decreased balance;Decreased mobility;Decreased knowledge of use of DME PT Treatment Interventions: DME instruction;Gait training;Stair training;Functional mobility training;Therapeutic activities;Therapeutic exercise;Balance training;Patient/family education     PT Goals(Current goals can be found in the care plan section) Acute Rehab PT Goals Patient Stated Goal: None stated.   PT Goal Formulation: With patient Time For Goal Achievement: 05/24/13 Potential to Achieve Goals: Good  Visit Information  Last PT Received On: 05/10/13 Assistance Needed: +1 History of Present Illness: pt presents with descending thoracic aorta hematoma and is not a surgical candidate at this time.         Prior Functioning  Home Living Family/patient expects to be discharged to:: Private residence Living Arrangements: Children Available Help at Discharge: Family Type of Home: Apartment Home Access: Stairs to enter Secretary/administrator of Steps: flight Home Layout: One level Home Equipment: None Additional Comments: Pt unable to provide full description due to language barrier; no family available. Prior Function Level of Independence: Independent Communication Communication: Prefers language other than English    Cognition  Cognition Arousal/Alertness: Awake/alert Behavior During Therapy: WFL for tasks assessed/performed Overall Cognitive Status: Within Functional Limits for tasks assessed    Extremity/Trunk Assessment Upper Extremity Assessment Upper Extremity Assessment: Generalized weakness Lower Extremity Assessment Lower Extremity Assessment: Generalized weakness   Balance Balance Balance Assessed: No  End of Session PT - End of Session Equipment Utilized During Treatment: Gait belt Activity Tolerance: Patient tolerated treatment  well Patient left: in bed;with call bell/phone within reach Nurse Communication:  Mobility status  GP     [name], [place] [phone] 05/10/2013, 3:11 PM

## 2013-05-10 NOTE — Progress Notes (Signed)
Patient examined and record reviewed.Hemodynamics stable,labs satisfactory.Patient had stable day.Continue current care.  Patient comfortable out of bed to chair blood pressure remaining stable no evidence of further aortic pathology following recurrent intramural hematoma of the descending thoracic aortic false lumen [name],[name] 05/10/2013

## 2013-05-11 ENCOUNTER — Inpatient Hospital Stay (HOSPITAL_COMMUNITY): Payer: Medicare PPO

## 2013-05-11 LAB — CBC
HCT: 38.7 % (ref 36.0–46.0)
Hemoglobin: 12.9 g/dL (ref 12.0–15.0)
MCH: 29.4 pg (ref 26.0–34.0)
MCHC: 33.3 g/dL (ref 30.0–36.0)
MCV: 88.2 fL (ref 78.0–100.0)
Platelets: 264 10*3/uL (ref 150–400)
RBC: 4.39 MIL/uL (ref 3.87–5.11)
RDW: 17.4 % — ABNORMAL HIGH (ref 11.5–15.5)
WBC: 8.7 10*3/uL (ref 4.0–10.5)

## 2013-05-11 LAB — BASIC METABOLIC PANEL
BUN: 22 mg/dL (ref 6–23)
CO2: 26 mEq/L (ref 19–32)
Calcium: 8.8 mg/dL (ref 8.4–10.5)
Chloride: 98 mEq/L (ref 96–112)
Creatinine, Ser: 1.13 mg/dL — ABNORMAL HIGH (ref 0.50–1.10)
GFR calc Af Amer: 51 mL/min — ABNORMAL LOW (ref >90)
GFR calc non Af Amer: 44 mL/min — ABNORMAL LOW (ref >90)
Glucose, Bld: 124 mg/dL — ABNORMAL HIGH (ref 70–99)
Potassium: 3.7 mEq/L (ref 3.5–5.1)
Sodium: 136 mEq/L (ref 135–145)

## 2013-05-11 MED ORDER — FENTANYL CITRATE 0.05 MG/ML IJ SOLN
50.0000 ug | INTRAMUSCULAR | Status: DC | PRN
  Administered 2013-05-11: 50 ug via INTRAVENOUS
  Filled 2013-05-11: qty 2

## 2013-05-11 MED ORDER — ENSURE PUDDING PO PUDG
1.0000 | Freq: Three times a day (TID) | ORAL | Status: DC
  Administered 2013-05-11 – 2013-05-14 (×6): 1 via ORAL

## 2013-05-11 MED ORDER — MAGNESIUM HYDROXIDE 400 MG/5ML PO SUSP
30.0000 mL | Freq: Every day | ORAL | Status: DC
  Administered 2013-05-11 – 2013-05-14 (×4): 30 mL via ORAL
  Filled 2013-05-11 (×7): qty 30

## 2013-05-11 MED ORDER — FENTANYL CITRATE 0.05 MG/ML IJ SOLN: 25.0000 ug | INTRAMUSCULAR | Status: DC | PRN

## 2013-05-11 NOTE — Progress Notes (Signed)
Pt experiencing severe pain in abdomen and mostly in back, pt states it is so bad she "can not move". BP 130/74, HR 70, Sat 92 on @liter  [place]. Labetalol 10mg  IV given for bp and Dr Donata Clay notified of change in pt pain, new order received for Fentanyl IV prn pain. Upon assessment lower extremities are warm with 1-2+ pulses and warm, will continue to monitor.

## 2013-05-11 NOTE — Progress Notes (Signed)
  Subjective: Recurrent bleed into intramural hematoma-false lumen of descending aorta from old repaired type A dissection-related to hypertension and Coumadin for A. Fib. Patient not a candidate for replacement or T-EVAR of descending aorta.  Patient had recurrent episode of chest and back pain last night relieved with IV fentanyl associated with moderate hypertension treated with IV labetalol. Patient slept after that and is comfortable is morning. Chest x-ray shows no change in mediastinal widening, no no pleural effusion. Pulses intact and exam unchanged this a.m.    Objective: Vital signs in last 24 hours: Temp:  [97.4 F (36.3 C)-98.6 F (37 C)] 97.6 F (36.4 C) (09/28 0400) Pulse Rate:  [60-78] 78 (09/28 1000) Cardiac Rhythm:  [-] Atrial fibrillation (09/28 1000) Resp:  [0-31] 12 (09/28 1000) BP: (76-135)/(42-96) 105/63 mmHg (09/28 1000) SpO2:  [85 %-98 %] 94 % (09/28 1000) Weight:  [125 lb (56.7 kg)] 125 lb (56.7 kg) (09/28 0600)  Hemodynamic parameters for last 24 hours:   chronic atrial for ablation rate controlled  Intake/Output from previous day: 09/27 0701 - 09/28 0700 In: 750 [P.O.:540; I.V.:210] Out: 545 [Urine:545] Intake/Output this shift: Total I/O In: 230 [P.O.:200; I.V.:30] Out: 100 [Urine:100]    Lab Results:  Recent Labs  05/10/13 0347 05/11/13 0215  WBC 10.3 8.7  HGB 12.7 12.9  HCT 38.5 38.7  PLT 257 264   BMET:  Recent Labs  05/10/13 0347 05/11/13 0215  NA 136 136  K 3.8 3.7  CL 100 98  CO2 24 26  GLUCOSE 133* 124*  BUN 18 22  CREATININE 1.03 1.13*  CALCIUM 8.5 8.8    PT/INR:  Recent Labs  05/10/13 0347  LABPROT 15.0  INR 1.21   ABG    Component Value Date/Time   PHART 7.434 11/13/2012 0554   HCO3 26.4* 11/13/2012 0554   TCO2 28 11/13/2012 0554   ACIDBASEDEF 2.0 11/12/2012 0743   O2SAT 94.0 11/13/2012 0554   CBG (last 3)  No results found for this basename: GLUCAP,  in the last 72 hours  Assessment/Plan: S/P   repair of  type A dissection April 2014   Recurrent pain from functional recurrent type B dissection-- with previous repair of typeA dissection with replacement of ascending aorta and resuspension of valve.  Patient not a candidate for surgical replacement of the descending thoracic aorta or reconstruction with a stent graft (TVAR). Situation discussed with daughter Ms. [name] and she agrees that patient does not want further heart surgery, emergency intubation-chest compression or DC cardioversion if sudden catastrophic event occurs. Patient also understands and agrees to modifed CODE STATUS  Continue with blood pressure control, pain medication, and no more Coumadin or other anticoagulation.  LOS: 4 days    VAN TRIGT III,[name] 05/11/2013

## 2013-05-12 ENCOUNTER — Inpatient Hospital Stay (HOSPITAL_COMMUNITY): Payer: Medicare PPO

## 2013-05-12 NOTE — Progress Notes (Signed)
Verbal/phone report given to "Hope" RN on unit 2W. Patient to be transferred to 2W, bed 35.  No acute distress noted. VSS.

## 2013-05-12 NOTE — Progress Notes (Signed)
Physical Therapy Treatment/ Discharge Patient Details Name: [name] MRN: [id number] DOB: 31-Aug-1927 Today's Date: 05/12/2013 Time: 4132-4401 PT Time Calculation (min): 25 min  PT Assessment / Plan / Recommendation  History of Present Illness pt presents with descending thoracic aorta hematoma and is not a surgical candidate at this time.     PT Comments   Pt with excellent progress with mobility today. Pt able to perform all transfers and gait without difficulty and meeting goals. Pt reported fatigue of legs after activity and educated for HEP to maintain at home in addition to walking. Mid thoracic pain 4/10 during session with HR maintained near 80 throughout. Pt at baseline functional level without further need for therapy. Pt aware of goals and agreeable to no further therapy needs at this time. Will sign off and encourage continued mobility with nursing assist.   Follow Up Recommendations  No PT follow up     Does the patient have the potential to tolerate intense rehabilitation     Barriers to Discharge        Equipment Recommendations  None recommended by PT    Recommendations for Other Services    Frequency     Progress towards PT Goals Progress towards PT goals: Goals met/education completed, patient discharged from PT  Plan Discharge plan needs to be updated    Precautions / Restrictions Precautions Precautions: None   Pertinent Vitals/Pain 4/10 pain HR 80    Mobility  Bed Mobility Bed Mobility: Supine to Sit;Sit to Supine Supine to Sit: 7: Independent;HOB flat Sit to Supine: 7: Independent;HOB flat Transfers Sit to Stand: 6: Modified independent (Device/Increase time);From chair/3-in-1;From bed Stand to Sit: To bed;To chair/3-in-1 Ambulation/Gait Ambulation/Gait Assistance: 7: Independent Ambulation Distance (Feet): 600 Feet Assistive device: None Ambulation/Gait Assistance Details: steady with gait including head turns and change of direction Gait  Pattern: Within Functional Limits Gait velocity: WFL Stairs: Yes Stairs Assistance: 6: Modified independent (Device/Increase time) Stair Management Technique: One rail Right;Forwards;Alternating pattern Number of Stairs: 11    Exercises General Exercises - Lower Extremity Ankle Circles/Pumps: AROM;Both;20 reps;Seated Long Arc Quad: AROM;Both;20 reps;Seated Hip ABduction/ADduction: AROM;Both;20 reps;Seated Hip Flexion/Marching: AROM;Both;20 reps;Seated   PT Diagnosis:    PT Problem List:   PT Treatment Interventions:     PT Goals (current goals can now be found in the care plan section)    Visit Information  Last PT Received On: 05/12/13 Assistance Needed: +1 History of Present Illness: pt presents with descending thoracic aorta hematoma and is not a surgical candidate at this time.      Subjective Data      Cognition  Cognition Arousal/Alertness: Awake/alert Behavior During Therapy: WFL for tasks assessed/performed Overall Cognitive Status: Within Functional Limits for tasks assessed    Balance     End of Session PT - End of Session Activity Tolerance: Patient tolerated treatment well Patient left: in chair;with call bell/phone within reach Nurse Communication: Mobility status   GP     Delorse Lek 05/12/2013, 2:13 PM Delaney Meigs, PT [phone]

## 2013-05-12 NOTE — Progress Notes (Addendum)
      [address]       [name] [address]             [phone]             Subjective: She had fairly severe pain in her abdomen and back earlier this am. She no longer has it since being given Fentanyl.  Objective: Vital signs in last 24 hours: Temp:  [97.4 F (36.3 C)-98.6 F (37 C)] 97.8 F (36.6 C) (09/29 0746) Pulse Rate:  [61-91] 66 (09/29 0700) Cardiac Rhythm:  [-] Atrial fibrillation (09/29 0700) Resp:  [0-29] 19 (09/29 0700) BP: (87-129)/(41-84) 122/60 mmHg (09/29 0700) SpO2:  [89 %-100 %] 92 % (09/29 0700) Weight:  [57.2 kg (126 lb 1.7 oz)] 57.2 kg (126 lb 1.7 oz) (09/29 0600)   Current Weight  05/12/13 57.2 kg (126 lb 1.7 oz)      Intake/Output from previous day: 09/28 0701 - 09/29 0700 In: 490 [P.O.:450; I.V.:40] Out: 900 [Urine:900]   Physical Exam:  Cardiovascular: IRRR, IRRR;no murmurs, gallops, or rubs. Pulmonary: Clear to auscultation bilaterally; no rales, wheezes, or rhonchi. Abdomen: Soft, non tender, bowel sounds present. Extremities: SCDs in place. Pulses present bilaterally and feet are warm.   Lab Results: CBC: Recent Labs  05/10/13 0347 05/11/13 0215  WBC 10.3 8.7  HGB 12.7 12.9  HCT 38.5 38.7  PLT 257 264   BMET:  Recent Labs  05/10/13 0347 05/11/13 0215  NA 136 136  K 3.8 3.7  CL 100 98  CO2 24 26  GLUCOSE 133* 124*  BUN 18 22  CREATININE 1.03 1.13*  CALCIUM 8.5 8.8    PT/INR:  Lab Results  Component Value Date   INR 1.21 05/10/2013   INR 1.16 05/09/2013   INR 1.17 05/08/2013   ABG:  INR: Will add last result for INR, ABG once components are confirmed Will add last 4 CBG results once components are confirmed  Assessment/Plan:  1. CV - s/p repair of Type A dissection 2014. She is not a surgical candidate for replacement of descending thoracic aorta or reconstruction with a stent graft. Remains in a fib with CVR. On Amiodarone 200 daily,Lopressor 50 bid, Lisinopril 20 daily with good  control of blood pressure. NO coumadin. 2.  Pulmonary - CXR this am is stable 3.Continue present care. She does not want emergency intubation, chest compressions, or cardioversion if arrest occurs.   ZIMMERMAN,DONIELLE MPA-C 05/12/2013,7:59 AM  Transfer to unit 2000 today- she has been pain-free over 24 hrs

## 2013-05-12 NOTE — Progress Notes (Signed)
Pt ambulated approx. 400 feet with standby assistance. Pt tolerated very well.

## 2013-05-12 NOTE — Progress Notes (Signed)
UR Completed.  Megha Agnes Jane [phone] 05/12/2013  

## 2013-05-12 NOTE — Progress Notes (Signed)
Patient transferred to Room 2W- room 35 via wheelchair and on monitor.  VSS. Safety maintained. No acute distress noted.  All belongings including cell phone, bag, and coat sent with patient to room 2w35.  Chart, meds and SCDs given to receiving nurse. Hope, RN resuming care at this time.

## 2013-05-12 NOTE — Clinical Documentation Improvement (Signed)
THIS DOCUMENT IS NOT A PERMANENT PART OF THE MEDICAL RECORD  Please update your documentation with the medical record to reflect your response to this query. If you need help knowing how to do this please [phone].  05/12/13  Doree Fudge PA,  In a better effort to capture your patient's severity of illness, reflect appropriate length of stay and utilization of resources, a review of the patient medical record has revealed the following information:   - Known history of CHF per progress notes   - Repeat Echo 05/09/13 Study Conclusions - Left ventricle: The cavity size was normal. Wall thickness was normal. Systolic function was mildly to moderately reduced. The estimated ejection fraction was in the range of 40% to 45%. Diffuse hypokinesis. There was septal-lateral dyssynchrony. Features are consistent with a pseudonormal left ventricular filling pattern, with concomitant abnormal relaxation and increased filling pressure (grade 2 diastolic dysfunction). - Aortic valve: There was no stenosis. Mild regurgitation. - Mitral valve: Mild regurgitation. - Left atrium: The atrium was moderately dilated. - Right ventricle: The cavity size was normal. Systolic function was mildly reduced. - Right atrium: The atrium was moderately dilated. - Tricuspid valve: Peak RV-RA gradient:68mm Hg (S). - Pulmonary arteries: PA peak pressure: 38mm Hg (S). - Systemic veins: IVC measured 2.3 cm with some respirophasic variation, suggesting RA pressure 10 mmHg. Impressions: - Normal LV size with EF 40-45%. Diffuse hypokinesis with septal-lateral dyssynchrony. Normal RV size with mildly decreased systolic function. Moderate biatrial enlargement. Mild pulmonary hypertension. Mild MR and AI.     Based on your clinical judgment and CHF qualifying as a comorbid condition that has required monitoring and evaluation, please document the ACUITY and TYPE of Heart Failure in the progress notes and discharge  summary:   - Chronic Systolic and Diastolic Heart Failure   - Chronic Diastolic Heart Failure   - Other Acuity and Type of Heart Failure   - Unable to Clinically Determine   In responding to this query please exercise your independent judgment.    The fact that a query is asked, does not imply that any particular answer is desired or expected   Reviewed:chronic systolic heart failure as in medical record  Thank You,  Jerral Ralph  RN BSN CCDS Certified Clinical Documentation Specialist: [phone] Health Information Management 

## 2013-05-13 NOTE — Progress Notes (Addendum)
      [address]       [name] [address]             [phone]             Subjective: She states she had pain in her upper back (midline) last evening.  Objective: Vital signs in last 24 hours: Temp:  [97.6 F (36.4 C)-98.8 F (37.1 C)] 97.8 F (36.6 C) (09/30 0352) Pulse Rate:  [69-81] 77 (09/30 0500) Cardiac Rhythm:  [-] Atrial fibrillation (09/30 0352) Resp:  [0-22] 18 (09/29 1935) BP: (89-137)/(54-83) 106/63 mmHg (09/30 0500) SpO2:  [90 %-97 %] 96 % (09/30 0352) Weight:  [57.652 kg (127 lb 1.6 oz)] 57.652 kg (127 lb 1.6 oz) (09/30 0403)   Current Weight  05/13/13 57.652 kg (127 lb 1.6 oz)      Intake/Output from previous day: 09/29 0701 - 09/30 0700 In: 480 [P.O.:480] Out: -    Physical Exam:  Cardiovascular: IRRR, IRRR;no murmurs, gallops, or rubs. Pulmonary: Clear to auscultation bilaterally; no rales, wheezes, or rhonchi. Abdomen: Soft, non tender, bowel sounds present. Extremities: SCDs in place. Pulses present bilaterally and feet are warm.   Lab Results: CBC:  Recent Labs  05/11/13 0215  WBC 8.7  HGB 12.9  HCT 38.7  PLT 264   BMET:   Recent Labs  05/11/13 0215  NA 136  K 3.7  CL 98  CO2 26  GLUCOSE 124*  BUN 22  CREATININE 1.13*  CALCIUM 8.8    PT/INR:  Lab Results  Component Value Date   INR 1.21 05/10/2013   INR 1.16 05/09/2013   INR 1.17 05/08/2013   ABG:  INR: Will add last result for INR, ABG once components are confirmed Will add last 4 CBG results once components are confirmed  Assessment/Plan:  1. CV - s/p repair of Type A dissection 2014. She is not a surgical candidate for replacement of descending thoracic aorta or reconstruction with a stent graft. Remains in a fib with CVR. On Amiodarone 200 daily,Lopressor 50 bid, Lisinopril 20 daily with good control of blood pressure. NO coumadin. 2.  Pulmonary - CXR this am is stable 3.Continue present care. Hopefully, discharge in am with  HH.   ZIMMERMAN,DONIELLE MPA-C 05/13/2013,7:42 AM  She has had compliance- confusion issues with her meds Since BP control is VITAL to the medical therapy of her aortic disease -  she would benefit from home nursing to follow BP and compliance with meds

## 2013-05-13 NOTE — Care Management Note (Signed)
    Page 1 of 2   05/16/2013     1:39:30 PM   CARE MANAGEMENT NOTE 05/16/2013  Patient:  [name]   Account Number:  [id number]  Date Initiated:  05/09/2013  Documentation initiated by:  Avie Arenas  Subjective/Objective Assessment:   Thoracis aneurysm - treating medically - on IV ntg.     Action/Plan:   Anticipated DC Date:  05/13/2013   Anticipated DC Plan:  HOME W HOME HEALTH SERVICES      DC Planning Services  CM consult      [facility]  Resumption Of Svcs/PTA Provider  HOME HEALTH   Choice offered to / List presented to:  C-1 Patient        HH arranged  HH-1 RN  HH-10 DISEASE MANAGEMENT      HH agency  [facility]   Status of service:  Completed, signed off Medicare Important Message given?   (If response is "NO", the following Medicare IM given date fields will be blank) Date Medicare IM given:   Date Additional Medicare IM given:    Discharge Disposition:  HOME W HOME HEALTH SERVICES  Per UR Regulation:  Reviewed for med. necessity/level of care/duration of stay  If discussed at Long Length of Stay Meetings, dates discussed:    Comments:  Contact:  [name],[name] Daughter [phone]                 [name] Daughter [phone]  05/16/13 [name] [phone] PT FOR DC HOME TODAY WITH DAUGHTER.  REVIEWED HOMECARE ARRANGEMENTS WITH DAUGHTER.  NOTIFIED [facility] OF DC DATE. START OF CARE 24-48H POST DC DATE.  05/13/13 [name] Garoutte,RN,BSN [phone] PT ACTIVE WITH [facility] PRIOR TO ADMISSION.  WILL NEED HHRN AT DC TO FOLLOW CLOSELY FOR BP AND MEDICATION MANAGEMENT. WILL ARRANGE SERVICES.

## 2013-05-13 NOTE — Discharge Summary (Signed)
Physician Discharge Summary       [address]       [name] [address]             [phone]    Patient ID: [name] MRN: [id number] DOB/AGE: April 20, 1928 79 y.o.  Admit date: 05/07/2013 Discharge date: 05/15/2013  Admission Diagnoses: 1. Uncontrolled hypertension 2. Recurrent small bleed into thrombosed false lumen of descending thoracic aorta 3.History of Type A thoracic aortic dissection (s/p repair 11/11/2012) 4.History of a fib 5.History of CHF  Discharge Diagnoses:   1.History of hypertension 2. Recurrent small bleed into thrombosed false lumen of descending thoracic aorta 3.History of Type A thoracic aortic dissection (s/p repair 11/11/2012) 4.History of a fib 5.History of CHF    History of Presenting Illness: This is an 62 -year-old hypertensive [place] female who is status post repair of a subacute tvpe A acsending thoracic dissection utilizing a 30 mm hema shield straight graft, resuspension of aortic valve using hypothermic circulatory arrest on 11/11/2012.. The patient has history of chronic atrial fibrillation and was previously carefully started back on Coumadin, 3 months following surgery. The patient had a six-month postop CT scan which showed an intact repair of the ascending aorta and aortic root with thrombosis of the false lumen of the descending thoracic aorta.  She was hypertensive and it discussed with the patient and the daughter about the importance of having good control of the blood pressure. Lisinopril was increased to 5 daily. She was going to be seen in about 3 months for follow up. The patient later that day called the ER with complaints of mild neck pain, not back pain, that sounded like arthritis and Tylenol was recommended.       The patient then presented to the ED the morning of 05/08/2013 with increased severity of the lower neck and now upper back pain. She had hypertension with a systolic blood pressure 180. A repeat CT scan  showed a new small area of contrast in the descending thoracic aortic false lumen-hematoma -- this is approximately 7 mm in length and 2-3 mm in width in a circumferential pattern, 3 cm distal to the takeoff of the left subclavian. The ascending aortic graft and repair is intact. There is no free pleural effusion or pericardial effusion. The diameter of the descending thoracic aorta is stable at 5.2 cm including both the true lumen and thrombosed false lumen.       The patient was started on nitride drip, given 1 dose of IV Lopressor and 1 dose of IV morphine and is now comfortable in the ED without pain. EKG shows chronic atrial fibrillation. She was given vitamin K concentrate to reverse her INR of 1.8.       The patient did not have a coronary arteriogram prior to her emergent type a dissection repair and a postoperative echo showed evidence of reduced LV function, anterior wall hypokinesia. She was hospitalized for symptomatic heart failure approximately 2 months after surgery, but has been doing very well on medical therapy since that time. Repeat 2-D echo showed LVEF 40-45%, no AS and mild AI, mild MR,mild TR, and no pericardial effusion.Cardiac enzymes were negative and her recent EKG showed stable chronic Q waves anteriorly. She was admitted for further evaluation and treatment by Dr. Donata Clay.   Brief Hospital Course:  She remained afebrile and hemodynamically stable. Adjustments were made to her medications for better control of her blood pressure. Currently, she is on Lisinopril 20 daily, Lopressor  50 bid, and Amiodarone 200 daily. She remain in atrail fibrillation with a controlled ventricular rate. She was on Coumadin previously for her atrial fibrillation. This was discontinued on this admission. It was felt that the recurrent small bleed into the thrombosed false lumen of the descending aorta was due to both uncontrolled hypertension and Coumadin. She remained in the ICU until 05/12/2013. She  was then felt surgically stable for transfer to 2W for further convalescence. She is tolerating a diet, has had bowel movements, and is ambulating. Fentanyl patch and heating pad have provided pain relief.Provided her blood pressure remains under good control and pending morning round evaluation, she will be surgically stable for discharge with home health on 05/14/2013.  Latest Vital Signs: Blood pressure 135/72, pulse 72, temperature 97.4 F (36.3 C), temperature source Oral, resp. rate 18, height 5\' 1"  (1.549 m), weight 56.2 kg (123 lb 14.4 oz), SpO2 91.00%.  Physical Exam: Cardiovascular: IRRR, IRRR;no murmurs, gallops, or rubs.  Pulmonary: Clear to auscultation bilaterally; no rales, wheezes, or rhonchi.  Abdomen: Soft, non tender, bowel sounds present.  Extremities: SCDs in place. Pulses present bilaterally and feet are warm.   Discharge Condition: Stable  Recent laboratory studies:  Lab Results  Component Value Date   WBC 8.7 05/11/2013   HGB 12.9 05/11/2013   HCT 38.7 05/11/2013   MCV 88.2 05/11/2013   PLT 264 05/11/2013   Lab Results  Component Value Date   NA 136 05/11/2013   K 3.7 05/11/2013   CL 98 05/11/2013   CO2 26 05/11/2013   CREATININE 1.13* 05/11/2013   GLUCOSE 124* 05/11/2013      Diagnostic Studies: Ct Angio Chest W/cm &/or Wo Cm  05/08/2013   CLINICAL DATA:  Back pain  EXAM: CT ANGIOGRAPHY CHEST WITH CONTRAST  TECHNIQUE: Multidetector CT imaging of the chest was performed using the standard protocol during bolus administration of intravenous contrast. Multiplanar CT image reconstructions including MIPs were obtained to evaluate the vascular anatomy.  CONTRAST:  OMNIPAQUE IOHEXOL 350 MG/ML SOLN  COMPARISON:  04/28/2013  FINDINGS: THORACIC INLET/BODY WALL:  No acute abnormality.  MEDIASTINUM:  There is new circumferential intramural high density extending from the level of an ascending aortic graft to the mid descending thoracic aorta, at the level of the left lower  lobe bronchus. The intramural/subintimal hematoma measures up to 7 mm in thickness at its midpoint. Entry point present along the lower anterior margin of the the aorta 3 cm beyond the left subclavian origin. The great vessels remain patent and not narrowed. There is a fusiform descending thoracic aortic aneurysm which is similar at size at 5.4 cm maximal dimension. As before there is luminal thrombus along the outer wall of the descending thoracic aorta.  Status post ascending aorta and aortic root replacement. The graft appears unchanged. No change in cardiomegaly. No pericardial effusion.  LUNG WINDOWS:  Patchy air trapping, mainly in the upper lungs. There is a small, low-density left pleural effusion. Chronic atelectasis present around the descending aortic aneurysm.  UPPER ABDOMEN:  No acute findings.  OSSEOUS:  No acute fracture.  No suspicious lytic or blastic lesions.  CriticalValue/emergent results were called by telephone at the time of interpretation on 05/08/2013 at 4:58 [facility] , who verbally acknowledged these results.  Review of the MIP images confirms the above findings.  IMPRESSION: 1. Acute aortic syndrome with intramural hemorrhage extending from the ascending aortic graft to the mid thoracic aorta. The entry point is an ulcer located along  the lower anterior aorta 3 cm beyond the left subclavian origin. No great vessel origin narrowing. 2. Size stable descending thoracic aortic aneurysm at 5.4 cm. 3. Stable appearance of aortic root and ascending aorta repair.   Electronically Signed   By: Tiburcio Pea   On: 05/08/2013 05:10   Dg Chest Port 1 View  05/12/2013   *RADIOLOGY REPORT*  Clinical Data: Intramural hematoma, status post Bentall procedure.  PORTABLE CHEST - 1 VIEW  Comparison: 05/11/2013 and CT chest 05/08/2013.  Findings: Trachea is midline.  Heart size stable.  Aneurysmal ascending, transverse and descending thoracic aorta, as before. Minimal linear atelectasis or scarring  at the left lung base. Lungs are otherwise clear.  No pneumothorax.  Surgical clips are seen in the right axillary region.  IMPRESSION:  1.  Aneurysmal ascending, transverse and descending thoracic aorta, as before. 2.  Left basilar atelectasis or scar.   Original Report Authenticated By: Leanna Battles, M.D.    Ct Angio Chest Aorta W/cm &/or Wo/cm  04/28/2013   *RADIOLOGY REPORT*  Clinical Data: Thoracic aortic aneurysm follow up  CT ANGIOGRAPHY CHEST  Technique:  Multidetector CT imaging of the chest using the standard protocol during bolus administration of intravenous contrast. Multiplanar reconstructed images including MIPs were obtained and reviewed to evaluate the vascular anatomy.  Contrast: 75mL OMNIPAQUE IOHEXOL 350 MG/ML SOLN  Comparison: 03/09/2013 and 11/06/2012  Findings: Status post repair of the ascending aorta is stable in appearance compared with July of this year.  The aorta remains patent.  Origins of the great vessels are patent without evidence of dissection.  Right subclavian and right common carotid arteries are patent.  Bilateral vertebral arteries are patent.  Chronic mural thrombus within the descending aorta has progressed since the study from March.  The study from July contained very limited contrast in the aorta.  Maximal diameter of the descending aorta is 5.3 cm.  This is stable compared with July 8.  There is no evidence of dissection in the descending aorta.  Mixing artifact is noted.  There is no obvious filling defect in the pulmonary arterial tree to suggest acute pulmonary thromboembolism.  Contrast in the pulmonary arteries is limited.  No evidence of mediastinal adenopathy or pericardial effusion.  The thyroid remains somewhat lobulated.  Dependent atelectasis at the lung bases right greater than left.  There is incomplete union of the sternum.  No vertebral compression deformity.  There are subtle 1 cm hypodensities in the left lobe of the liver which are unchanged  compared with the prior study.  Given the stability, they are likely benign.  Atrophic changes of the right kidney are present.  Small lymph node adjacent to the greater curvature of the stomach.  IMPRESSION: Satisfactory repair of the ascending aorta without evidence of recurrent dissection or thrombosis.  Great vessels are patent.  Maximal diameter of the descending aorta is stable at 5.3 cm. Chronic mural thrombus is present in the descending aorta.  There are small hypodensities in the left lobe of the liver which are stable compared to the study from March.  Given the stability, these are likely benign.  The patient has a history malignancy, consider MRI for further evaluation.   Original Report Authenticated By: Jolaine Click, M.D.            Future Appointments Provider Department Dept Phone   05/28/2013 3:00 PM Kerin Perna, MD [facility] [phone]   06/20/2013 11:15 AM Donato Schultz, MD [facility]  [address] [phone]   07/02/2013 12:00 PM Kerin Perna, MD [facility] [phone]      Discharge Medications:   Medication List    STOP taking these medications       digoxin 0.125 MG tablet  Commonly known as:  LANOXIN     warfarin 5 MG tablet  Commonly known as:  COUMADIN      TAKE these medications       acetaminophen 325 MG tablet  Commonly known as:  TYLENOL  Take 2 tablets (650 mg total) by mouth every 6 (six) hours as needed for pain.     amiodarone 200 MG tablet  Commonly known as:  PACERONE  Take 200 mg by mouth daily.     fentaNYL 25 MCG/HR patch  Commonly known as:  DURAGESIC - dosed mcg/hr  Place 1 patch (25 mcg total) onto the skin every 3 (three) days.     furosemide 40 MG tablet  Commonly known as:  LASIX  Take 1 tablet (40 mg total) by mouth daily.     lisinopril 20 MG tablet  Commonly known as:  PRINIVIL,ZESTRIL  Take 1 tablet (20 mg total) by mouth  daily.     metoprolol 50 MG tablet  Commonly known as:  LOPRESSOR  Take 1 tablet (50 mg total) by mouth 2 (two) times daily.     potassium chloride SA 20 MEQ tablet  Commonly known as:  K-DUR,KLOR-CON  Take 20 mEq by mouth daily.     vitamin C 1000 MG tablet  Take 1,000 mg by mouth daily.        Follow Up Appointments: Follow-up Information   Follow up with VAN Dinah Beers, MD. (PA/LAT CXR to be taken (at [facility] which is in the same building as Dr. Zenaida Niece Trigt's office) on 05/28/2013 at 2:30 pm;Appointment with Dr. Donata Clay is on 05/28/2013 at 3:30 pm)    Specialty:  Cardiothoracic Surgery   Contact information:   [address] [place] [address] [phone]       Signed: Doree Fudge MPA-C 05/15/2013, 8:15 AM

## 2013-05-14 ENCOUNTER — Inpatient Hospital Stay (HOSPITAL_COMMUNITY): Payer: Medicare PPO

## 2013-05-14 MED ORDER — ACETAMINOPHEN 325 MG PO TABS: 650.0000 mg | ORAL_TABLET | Freq: Four times a day (QID) | ORAL | Status: DC | PRN

## 2013-05-14 MED ORDER — LISINOPRIL 20 MG PO TABS: 20.0000 mg | ORAL_TABLET | Freq: Every day | ORAL | Status: DC

## 2013-05-14 MED ORDER — TRAMADOL HCL 50 MG PO TABS: 50.0000 mg | ORAL_TABLET | Freq: Four times a day (QID) | ORAL | Status: DC | PRN

## 2013-05-14 MED ORDER — FENTANYL 25 MCG/HR TD PT72
25.0000 ug | MEDICATED_PATCH | TRANSDERMAL | Status: DC
  Administered 2013-05-14: 25 ug via TRANSDERMAL
  Filled 2013-05-14: qty 1

## 2013-05-14 NOTE — Progress Notes (Addendum)
      [address]       [name] [address]             [phone]             Subjective: She states she was able to sleep last night, but then awakened with neck and pain between her shoulders. She states the pain was "very bad". Nurse gave her a heating pad this am and this seems to help.  Objective: Vital signs in last 24 hours: Temp:  [97.6 F (36.4 C)-98.5 F (36.9 C)] 97.6 F (36.4 C) (10/01 0549) Pulse Rate:  [72-86] 72 (10/01 0549) Cardiac Rhythm:  [-] Atrial fibrillation (09/30 2000) Resp:  [18] 18 (10/01 0549) BP: (120-134)/(68-79) 132/79 mmHg (10/01 0549) SpO2:  [91 %-93 %] 91 % (10/01 0549) Weight:  [56.6 kg (124 lb 12.5 oz)] 56.6 kg (124 lb 12.5 oz) (10/01 0549)   Current Weight  05/14/13 56.6 kg (124 lb 12.5 oz)      Intake/Output from previous day: 09/30 0701 - 10/01 0700 In: 600 [P.O.:600] Out: 1150 [Urine:1150]   Physical Exam:  Cardiovascular: IRRR, IRRR;no murmurs, gallops, or rubs. Pulmonary: Clear to auscultation bilaterally; no rales, wheezes, or rhonchi. Abdomen: Soft, non tender, bowel sounds present. Extremities: SCDs in place. Pulses present bilaterally and feet are warm.   Lab Results: CBC: No results found for this basename: WBC, HGB, HCT, PLT,  in the last 72 hours BMET:  No results found for this basename: NA, K, CL, CO2, GLUCOSE, BUN, CREATININE, CALCIUM,  in the last 72 hours  PT/INR:  Lab Results  Component Value Date   INR 1.21 05/10/2013   INR 1.16 05/09/2013   INR 1.17 05/08/2013   ABG:  INR: Will add last result for INR, ABG once components are confirmed Will add last 4 CBG results once components are confirmed  Assessment/Plan:  1. CV - s/p repair of Type A dissection 2014. She is not a surgical candidate for replacement of descending thoracic aorta or reconstruction with a stent graft. Remains in a fib with CVR. On Amiodarone 200 daily,Lopressor 50 bid, Lisinopril 20 daily with good control of  blood pressure. NO coumadin. 2.  Pulmonary - CXR this am is stable 3. Ultram for pain and heating pad PRN 4.Will discuss disposition with surgeon   ZIMMERMAN,DONIELLE MPA-C 05/14/2013,7:35 AM  Still having some neck pain- hard to differentiate between DJD and descending type A anuerysm BP well controlled Re check CXR today Will not send home until pain is controlled or she will be right back.  take off IV narcotics and start low dose patch

## 2013-05-15 LAB — BASIC METABOLIC PANEL
BUN: 13 mg/dL (ref 6–23)
CO2: 27 mEq/L (ref 19–32)
Calcium: 8.8 mg/dL (ref 8.4–10.5)
Chloride: 97 mEq/L (ref 96–112)
Creatinine, Ser: 0.8 mg/dL (ref 0.50–1.10)
GFR calc Af Amer: 77 mL/min — ABNORMAL LOW (ref >90)
GFR calc non Af Amer: 66 mL/min — ABNORMAL LOW (ref >90)
Glucose, Bld: 100 mg/dL — ABNORMAL HIGH (ref 70–99)
Potassium: 4.1 mEq/L (ref 3.5–5.1)
Sodium: 135 mEq/L (ref 135–145)

## 2013-05-15 MED ORDER — LISINOPRIL 20 MG PO TABS: 20.0000 mg | ORAL_TABLET | Freq: Every day | ORAL | Status: DC

## 2013-05-15 MED ORDER — FENTANYL 25 MCG/HR TD PT72: 1.0000 | MEDICATED_PATCH | TRANSDERMAL | Status: DC

## 2013-05-15 NOTE — Progress Notes (Addendum)
      [address]       [name] [address]             [phone]             Subjective: She states she has no pain. She also slept very well.  Objective: Vital signs in last 24 hours: Temp:  [97.4 F (36.3 C)-98.4 F (36.9 C)] 97.4 F (36.3 C) (10/02 0538) Pulse Rate:  [61-78] 72 (10/02 0538) Cardiac Rhythm:  [-] Atrial fibrillation (10/01 2000) Resp:  [18] 18 (10/02 0538) BP: (106-135)/(61-72) 135/72 mmHg (10/02 0538) SpO2:  [91 %-95 %] 91 % (10/02 0538) Weight:  [56.2 kg (123 lb 14.4 oz)] 56.2 kg (123 lb 14.4 oz) (10/02 0538)   Current Weight  05/15/13 56.2 kg (123 lb 14.4 oz)      Intake/Output from previous day: 10/01 0701 - 10/02 0700 In: 240 [P.O.:240] Out: -    Physical Exam:  Cardiovascular: IRRR, IRRR;no murmurs, gallops, or rubs. Pulmonary: Clear to auscultation bilaterally; no rales, wheezes, or rhonchi. Abdomen: Soft, non tender, bowel sounds present. Extremities: SCDs in place. Pulses present bilaterally and feet are warm.   Lab Results: CBC: No results found for this basename: WBC, HGB, HCT, PLT,  in the last 72 hours BMET:  No results found for this basename: NA, K, CL, CO2, GLUCOSE, BUN, CREATININE, CALCIUM,  in the last 72 hours  PT/INR:  Lab Results  Component Value Date   INR 1.21 05/10/2013   INR 1.16 05/09/2013   INR 1.17 05/08/2013   ABG:  INR: Will add last result for INR, ABG once components are confirmed Will add last 4 CBG results once components are confirmed  Assessment/Plan:  1. CV - s/p repair of Type A dissection 2014. She is not a surgical candidate for replacement of descending thoracic aorta or reconstruction with a stent graft. Remains in a fib with CVR (NO coumadin). On Amiodarone 200 daily,Lopressor 50 bid, Lisinopril 20 daily. SBP mostly in the 130's. Will discuss with Dr. Donata Clay if need to titrate medications.  2.  Pulmonary - CXR has been stable 3. On Duragesic patch for pain. She is no  longer taking IV pain meds. 4.Likely discharge later today.  [name],[name] MPA-C 05/15/2013,7:31 AM  patient examined and medical record reviewed,agree with above note.Cont current BP meds Needs HHN to check BP, med compliance 3xs/week VAN TRIGT III,[name] 05/16/2013

## 2013-05-16 NOTE — Progress Notes (Signed)
Came back again to discuss [facility] Care Management services. Met with patient's daughter, Nichola Sizer. Plan is for patient to go home with [facility] services. Consents were signed for [facility] Care Management. Explained to daughter that [facility] Care Management will not interfere with home health services. Left [facility] Care Management packet with daughter and confirmed her cell number as being the best contact number [phone]. Post hospital discharge call will be performed and patient will be evaluated for monthly home visits. Raiford Noble, MSN- Ed, RN,BSN- [facility] Care [phone]

## 2013-05-16 NOTE — Progress Notes (Signed)
Discharge instructions and prescriptions given to pt and pt's dtr. Both verbalized their understanding of discharge info and appointments. Pt is stable for discharge with daughter. Will provide wheelchair escort to car.

## 2013-05-16 NOTE — Discharge Summary (Signed)
[id number] patient examined and medical record reviewed,agree with above note. VAN TRIGT III,Koreen Lizaola 05/16/2013

## 2013-05-17 ENCOUNTER — Telehealth: Payer: Self-pay | Admitting: Thoracic Surgery (Cardiothoracic Vascular Surgery)

## 2013-05-17 NOTE — Telephone Encounter (Signed)
Mrs. [name]'s daughter called with a question regarding patient's medications  She was told by Dr. Donata Clay that her mother would be on a baby aspirin instead of coumadin after dc, but it was not prescribed  I instructed her to have her take one baby aspirin a day

## 2013-05-26 ENCOUNTER — Other Ambulatory Visit: Payer: Self-pay | Admitting: *Deleted

## 2013-05-26 DIAGNOSIS — I719 Aortic aneurysm of unspecified site, without rupture: Secondary | ICD-10-CM

## 2013-05-28 ENCOUNTER — Ambulatory Visit
Admission: RE | Admit: 2013-05-28 | Discharge: 2013-05-28 | Disposition: A | Discharge status: Home / Self Care | Payer: Commercial Managed Care - HMO | Source: Ambulatory Visit | Attending: Cardiothoracic Surgery | Admitting: Cardiothoracic Surgery

## 2013-05-28 ENCOUNTER — Ambulatory Visit (INDEPENDENT_AMBULATORY_CARE_PROVIDER_SITE_OTHER): Payer: Medicare PPO | Admitting: Cardiothoracic Surgery

## 2013-05-28 ENCOUNTER — Encounter: Payer: Self-pay | Admitting: Cardiothoracic Surgery

## 2013-05-28 VITALS — BP 97/69 | HR 80 | Resp 20 | Ht 61.0 in | Wt 121.0 lb

## 2013-05-28 DIAGNOSIS — I719 Aortic aneurysm of unspecified site, without rupture: Secondary | ICD-10-CM

## 2013-05-28 DIAGNOSIS — I7100 Dissection of unspecified site of aorta: Secondary | ICD-10-CM

## 2013-05-28 DIAGNOSIS — I509 Heart failure, unspecified: Secondary | ICD-10-CM

## 2013-05-28 DIAGNOSIS — Z9889 Other specified postprocedural states: Secondary | ICD-10-CM

## 2013-05-28 DIAGNOSIS — Z8679 Personal history of other diseases of the circulatory system: Secondary | ICD-10-CM

## 2013-05-28 NOTE — Progress Notes (Signed)
PCP is Emeterio Reeve, MD Referring Provider is Donato Schultz, MD  Chief Complaint  Patient presents with  . Routine Post Op    F/U from ED visit 05/12/13, with CXR, C/O low bp readings, sleeping alot and scratchy throat     HPI: Patient returns for followup after hospitalization for an acute intramural hematoma of the false lumen of the descending thoracic aorta from a pre-existing surgically repaired chronic type A dissection. Since returning home her pain has been resolved. Her blood pressure has been on the low side after increasing her antihypertensive medications. Her Coumadin has been stopped and she is taking aspirin 81 mg daily. She has been having problems with sleepiness and constipation probably related to the low-dose fentanyl patch.   Past Medical History  Diagnosis Date  . Afib   . Aortic root aneurysm   . Hypertension   . CHF (congestive heart failure)   . Shortness of breath     Past Surgical History  Procedure Laterality Date  . Bentall procedure N/A 11/11/2012    Procedure: BENTALL PROCEDURE;  Surgeon: Kerin Perna, MD;  Location: [facility] OR;  Service: Open Heart Surgery;  Laterality: N/A;  *CIRC ARREST*   . Intraoperative transesophageal echocardiogram N/A 11/11/2012    Procedure: INTRAOPERATIVE TRANSESOPHAGEAL ECHOCARDIOGRAM;  Surgeon: Kerin Perna, MD;  Location: [facility] OR;  Service: Open Heart Surgery;  Laterality: N/A;  . Ascending aortic aneurysm repair  10/2012    No family history on file.  Social History History  Substance Use Topics  . Smoking status: Never Smoker   . Smokeless tobacco: Never Used  . Alcohol Use: No    Current Outpatient Prescriptions  Medication Sig Dispense Refill  . acetaminophen (TYLENOL) 325 MG tablet Take 2 tablets (650 mg total) by mouth every 6 (six) hours as needed for pain.      Marland Kitchen amiodarone (PACERONE) 200 MG tablet Take 200 mg by mouth daily.      . Ascorbic Acid (VITAMIN C) 1000 MG tablet Take 1,000 mg by mouth daily.       . fentaNYL (DURAGESIC - DOSED MCG/HR) 25 MCG/HR patch Place 1 patch (25 mcg total) onto the skin every 3 (three) days.  10 patch  0  . furosemide (LASIX) 20 MG tablet Take 20 mg by mouth daily.      . metoprolol succinate (TOPROL-XL) 25 MG 24 hr tablet Take 25 mg by mouth daily.       No current facility-administered medications for this visit.    No Known Allergies  Review of Systems patient is been measured blood pressure home and evenings is been low sometimes less than 100 mm mercury systolic  Patient complains of hoarseness since her previous admission probably related to stretch and the recurrent laryngeal nerve from the dilated descending aorta  BP 97/69  Pulse 80  Resp 20  Ht 5\' 1"  (1.549 m)  Wt 121 lb (54.885 kg)  BMI 22.87 kg/m2  SpO2 96% Physical Exam Patient is alert but somewhat sleepy with a hoarse voice Lungs are clear Heart rhythm is irregular from atrial fibrillation No murmur Stable baseline 2+ pedal edema   Diagnostic Tests:  Chest x-ray reveals clear lung fields, no pleural effusion, stable baseline mediastinal enlargement from descending aneurysm  Impression: Status post successful surgical repair of a type a dissection with replacement of the ascending aorta and resuspension of aortic valve.  Persistent dilatation of the descending thoracic aorta with recent intramural bleed into the false lumen from hypertension  and Coumadin for atrial fibrillation--Coumadin has been stopped  Plan: Readjust antihypertensive medications-discontinue lisinopril, change beta blocker to Toprol-XL 25 mg daily Patient's pain medication in the form of the 25 mcg fentanyl patch we used as necessary only.  Patient return in 3 weeks for blood pressure check in general review

## 2013-06-03 ENCOUNTER — Encounter: Payer: Self-pay | Admitting: Cardiology

## 2013-06-03 ENCOUNTER — Ambulatory Visit (INDEPENDENT_AMBULATORY_CARE_PROVIDER_SITE_OTHER): Payer: Medicare PPO | Admitting: Cardiology

## 2013-06-03 VITALS — BP 122/80 | HR 109 | Ht 62.0 in | Wt 125.4 lb

## 2013-06-03 DIAGNOSIS — I429 Cardiomyopathy, unspecified: Secondary | ICD-10-CM

## 2013-06-03 DIAGNOSIS — I428 Other cardiomyopathies: Secondary | ICD-10-CM

## 2013-06-03 DIAGNOSIS — I719 Aortic aneurysm of unspecified site, without rupture: Secondary | ICD-10-CM

## 2013-06-03 DIAGNOSIS — I7100 Dissection of unspecified site of aorta: Secondary | ICD-10-CM

## 2013-06-03 DIAGNOSIS — I10 Essential (primary) hypertension: Secondary | ICD-10-CM

## 2013-06-03 DIAGNOSIS — I4891 Unspecified atrial fibrillation: Secondary | ICD-10-CM

## 2013-06-03 DIAGNOSIS — I700 Atherosclerosis of aorta: Secondary | ICD-10-CM

## 2013-06-03 HISTORY — DX: Atherosclerosis of aorta: I70.0

## 2013-06-03 MED ORDER — METOPROLOL SUCCINATE ER 50 MG PO TB24: 50.0000 mg | ORAL_TABLET | Freq: Every day | ORAL | Status: DC

## 2013-06-03 NOTE — Progress Notes (Signed)
[address] [place], [place]  [address] Phone: [phone] Fax:  [phone]  Date:  06/03/2013   ID:  [name], DOB Nov 18, 1927, MRN [id number]  PCP:  Emeterio Reeve, MD   History of Present Illness: [name] is a 79 y.o. female with aortic dissection thoracic repair by Dr. Maren Beach with atrial fibrillation, intramural hematoma of thoracic aorta not allowing chronic anticoagulation, chronic combined systolic/diastolic heart failure with ejection fraction of 40-45% on 05/09/13 here for followup.  Her blood pressures have been on the low side. Sleepiness. Her beta blocker was changed to Toprol-XL 25 mg a day from tartrate 50mg  BID and her lisinopril was discontinued. Prior BP was 97/69.  After 5pm in evening BP was 139 SBP.    procedure fibrillation-unfortunately cannot utilize Coumadin at this time because of increasing in size intramural hematoma of the aorta. We have had difficulty in the past also rate controlling her atrial fibrillation. Because of this, amiodarone has been utilized to assist with this. Hypotension has been limiting.   Wt Readings from Last 3 Encounters:  06/03/13 125 lb 6.4 oz (56.881 kg)  05/28/13 121 lb (54.885 kg)  05/16/13 121 lb 11.2 oz (55.203 kg)     Past Medical History  Diagnosis Date  . Afib   . Aortic root aneurysm   . Hypertension   . CHF (congestive heart failure)   . Shortness of breath   . Aortic atherosclerosis 06/03/2013    Past Surgical History  Procedure Laterality Date  . Bentall procedure N/A 11/11/2012    Procedure: BENTALL PROCEDURE;  Surgeon: Kerin Perna, MD;  Location: [facility] OR;  Service: Open Heart Surgery;  Laterality: N/A;  *CIRC ARREST*   . Intraoperative transesophageal echocardiogram N/A 11/11/2012    Procedure: INTRAOPERATIVE TRANSESOPHAGEAL ECHOCARDIOGRAM;  Surgeon: Kerin Perna, MD;  Location: [facility] OR;  Service: Open Heart Surgery;  Laterality: N/A;  . Ascending aortic aneurysm repair   10/2012    Current Outpatient Prescriptions  Medication Sig Dispense Refill  . acetaminophen (TYLENOL) 325 MG tablet Take 2 tablets (650 mg total) by mouth every 6 (six) hours as needed for pain.      Marland Kitchen amiodarone (PACERONE) 200 MG tablet Take 200 mg by mouth daily.      . Ascorbic Acid (VITAMIN C) 1000 MG tablet Take 1,000 mg by mouth daily.      . fentaNYL (DURAGESIC - DOSED MCG/HR) 25 MCG/HR patch Place 1 patch (25 mcg total) onto the skin every 3 (three) days.  10 patch  0  . furosemide (LASIX) 20 MG tablet Take 40 mg by mouth daily.        No current facility-administered medications for this visit.    Allergies:   No Known Allergies  Social History:  The patient  reports that she has never smoked. She has never used smokeless tobacco. She reports that she does not drink alcohol or use illicit drugs.   ROS:  Please see the history of present illness.   Denies any fevers, chills, orthopnea, PND, syncope  All other systems reviewed and negative.   PHYSICAL EXAM: VS:  BP 122/80  Pulse 109  Ht 5\' 2"  (1.575 m)  Wt 125 lb 6.4 oz (56.881 kg)  BMI 22.93 kg/m2 Well nourished, well developed, in no acute distress HEENT: normal Neck: no JVD Cardiac:  Irregularly irregular, mildly elevated rate; no murmur Lungs:  clear to auscultation bilaterally, no wheezing, rhonchi or rales Abd: soft, nontender,  no hepatomegaly Ext: mild edema Skin: warm and dry Neuro: no focal abnormalities noted  EKG:  Atrial flutter/fibrillation heart rate 109, nonspecific ST-T wave changes, poor R wave progression, left axis deviation/prior inferior infarct pattern.     ASSESSMENT AND PLAN:  1. Atrial flutter/fibrillation-continuing with amiodarone, low-dose Toprol. We have battled with hypotension and had been limited. She is currently not on Coumadin because of intramural hematoma of aorta which expanded. I explained this to them at length. She is at increased risk of stroke from A. fib standpoint. Aspirin  only. 81 mg. It also is in 2. Aortic dissection repair/Bentall procedure-doing well. Prior note reviewed. 3. Intramural hematoma of aorta. Unable to give Coumadin because of this. 4. Hypertension/hypotension-ACE inhibitor off. Limiting. I will increase Toprol to 50 PO QD to improve rate control. She is having some BP elevation in evening. I'm also battling with rate control. I do not want her rate quite this high. We can also consider digoxin if blood pressure becomes too low with 50 mg of Toprol. In the past she was taking 50 mg twice a day. 5. Atherosclerosis of the aorta-noted on CT scan. Continuing with aggressive secondary prevention. 6. Combined systolic/diastolic heart failure-EF 40-45%. Trying to rate control atrial flutter/fibrillation as best as possible. Unable to utilize ACE inhibitor because of hypotension. Low-dose Toprol. 7. Cough/hoarseness-interestingly, she did not have this immediately after the surgery she states. I agree that this may be recurrent laryngeal nerve but I would've expected this immediately after surgery. Could this be GERD related or excess mucus production especially associated with cough. ACE inhibitor has been off. 8. We will see her back in one month for blood pressure followup. I appreciate team work with Dr. Maren Beach  Signed, Donato Schultz, MD [facility]  06/03/2013 10:13 AM

## 2013-06-03 NOTE — Patient Instructions (Signed)
Your physician has recommended you make the following change in your medication:   1. Increase Metoprolol to 50 mg once daily.  Your physician recommends that you schedule a follow-up appointment in: 1 month for Hypertension

## 2013-06-06 ENCOUNTER — Telehealth: Payer: Self-pay | Admitting: Cardiology

## 2013-06-06 NOTE — Telephone Encounter (Signed)
Family member calling due to elevated systolic blood pressure. Very closely watching blood pressure due to aneurysm repair in the past and recent hospitalization 1 month ago due to extension of false lumen due to elevated BPs. Recently toprol increased to 50 qday which is still less than prior doses (100 qday). Off ACEI as well due to hypotension. With systolics of 141 and pulse of 100, reasonable to take 25 mg prn tonight. Recommended keeping log of home BPs so meds can be further titrated. FYI to Dr. Anne Fu.

## 2013-06-09 NOTE — Telephone Encounter (Signed)
Change back to metoprolol tartrate 50mg  PO BID. Continue to hold/ dc ACE-I lisinopril.  Recently metoprolol tartrate was 50 BID, was cut to 25 ER QD. ACE-I stopped.  I increased metoprolol ER (succinate) to 50mg  QD at last clinic visit.   Both Dr. Maren Beach and I have been watching her BP closely.   Increasing metoprolol back to 50mg  BID TARTRATE will also help with rate control of aifb.   Continue to monitor closely.

## 2013-06-10 NOTE — Telephone Encounter (Signed)
Family member calling again this evening regarding blood pressure management. Prior recommendations by Dr. Anne Fu were not communicated to patient yet. Systolics in the 140s. No symptoms but similar to before, family is concerned about the elevated BP. Recommended the call Dr. Anne Fu office in the AM help clarify between succinate (have Rx) and switching back to tartrate bid.

## 2013-06-11 ENCOUNTER — Other Ambulatory Visit: Payer: Self-pay | Admitting: Cardiology

## 2013-06-11 ENCOUNTER — Telehealth: Payer: Self-pay | Admitting: Cardiology

## 2013-06-11 MED ORDER — METOPROLOL SUCCINATE ER 50 MG PO TB24: 50.0000 mg | ORAL_TABLET | Freq: Two times a day (BID) | ORAL | Status: DC

## 2013-06-11 NOTE — Telephone Encounter (Signed)
New message    Dr on called told daughter Dr Anne Fu changed her mother's medication.  She needs to talk to a nurse to find out details about medications

## 2013-06-11 NOTE — Telephone Encounter (Signed)
Left voicemail advising the patient to call the office. 

## 2013-06-11 NOTE — Telephone Encounter (Signed)
Since you have SUCCINATE (or long acting form) take this one 50mg  BID.

## 2013-06-17 ENCOUNTER — Encounter: Payer: Self-pay | Admitting: Cardiology

## 2013-06-18 ENCOUNTER — Ambulatory Visit (INDEPENDENT_AMBULATORY_CARE_PROVIDER_SITE_OTHER): Payer: Medicare PPO | Admitting: Cardiothoracic Surgery

## 2013-06-18 ENCOUNTER — Encounter: Payer: Self-pay | Admitting: Cardiothoracic Surgery

## 2013-06-18 VITALS — BP 116/78 | HR 96 | Resp 20 | Ht 62.0 in | Wt 125.0 lb

## 2013-06-18 DIAGNOSIS — I7100 Dissection of unspecified site of aorta: Secondary | ICD-10-CM

## 2013-06-18 DIAGNOSIS — I509 Heart failure, unspecified: Secondary | ICD-10-CM

## 2013-06-18 DIAGNOSIS — I959 Hypotension, unspecified: Secondary | ICD-10-CM

## 2013-06-18 DIAGNOSIS — Z9889 Other specified postprocedural states: Secondary | ICD-10-CM

## 2013-06-18 DIAGNOSIS — I719 Aortic aneurysm of unspecified site, without rupture: Secondary | ICD-10-CM

## 2013-06-18 DIAGNOSIS — Z8679 Personal history of other diseases of the circulatory system: Secondary | ICD-10-CM

## 2013-06-18 NOTE — Progress Notes (Signed)
PCP is Emeterio Reeve, MD Referring Provider is Emeterio Reeve, MD  Chief Complaint  Patient presents with  . Routine Post Op    3 week f/u BP check S/P Bentall procedure 10/14/2012    HPI: 79 year old [place] lady status post root replacement for type a dissection March 2014. She did not have a cardiac cath prior to surgery. Postop she's had one admission for CHF treated medically and one admission for recurrent chest pain from an intramural hematoma of the descending aorta--her Coumadin therapy for chronic atrial fibrillation was stopped at that time and and since then  her pain has resolved. Her symptoms of CHF currently are. well-controlled with Lasix and sodium restriction and overall she's done well. She's had no pain since her last visit. She is currently on metoprolol if the milligrams twice a day, amiodarone, aspirin.   Past Medical History  Diagnosis Date  . Afib   . Aortic root aneurysm   . Hypertension   . CHF (congestive heart failure)   . Shortness of breath   . Aortic atherosclerosis 06/03/2013    Past Surgical History  Procedure Laterality Date  . Bentall procedure N/A 11/11/2012    Procedure: BENTALL PROCEDURE;  Surgeon: Kerin Perna, MD;  Location: [facility] OR;  Service: Open Heart Surgery;  Laterality: N/A;  *CIRC ARREST*   . Intraoperative transesophageal echocardiogram N/A 11/11/2012    Procedure: INTRAOPERATIVE TRANSESOPHAGEAL ECHOCARDIOGRAM;  Surgeon: Kerin Perna, MD;  Location: [facility] OR;  Service: Open Heart Surgery;  Laterality: N/A;  . Ascending aortic aneurysm repair  10/2012    Family History  Problem Relation Age of Onset  . Heart attack Neg Hx   . Heart disease Neg Hx   . Heart failure Neg Hx   . Hypertension Neg Hx     Social History History  Substance Use Topics  . Smoking status: Never Smoker   . Smokeless tobacco: Never Used  . Alcohol Use: No    Current Outpatient Prescriptions  Medication Sig Dispense Refill  . acetaminophen (TYLENOL)  325 MG tablet Take 2 tablets (650 mg total) by mouth every 6 (six) hours as needed for pain.      Marland Kitchen amiodarone (PACERONE) 200 MG tablet Take 200 mg by mouth daily.      . Ascorbic Acid (VITAMIN C) 1000 MG tablet Take 1,000 mg by mouth daily.      Marland Kitchen aspirin 81 MG tablet Take 81 mg by mouth daily.      . furosemide (LASIX) 20 MG tablet Take 40 mg by mouth daily.       . metoprolol succinate (TOPROL-XL) 50 MG 24 hr tablet Take 50 mg by mouth daily. Take with or immediately following a meal.      . fentaNYL (DURAGESIC - DOSED MCG/HR) 25 MCG/HR patch Place 1 patch (25 mcg total) onto the skin every 3 (three) days.  10 patch  0   No current facility-administered medications for this visit.    No Known Allergies  Review of Systems patient's fairly sedentary No weight loss no symptoms of CHF no chest pain or back pain since last visit  BP 116/78  Pulse 96  Resp 20  Ht 5\' 2"  (1.575 m)  Wt 125 lb (56.7 kg)  BMI 22.86 kg/m2  SpO2 98% Physical Exam Patient appears to be doing very well Lungs are clear Heart rhythm is regular without murmur Baseline mild pedal edema  Diagnostic Tests: No x-rays today  Impression: Overall patient is doing well  now with compensated CHF, no back pain from her persistent false lumen of the descending thoracic aorta. We'll continue to hold Coumadin. Blood pressure is adequately controlled twice a day metoprolol.  Plan: I'll see her in January with a CT chest.

## 2013-06-20 ENCOUNTER — Encounter: Payer: Self-pay | Admitting: Cardiology

## 2013-06-20 ENCOUNTER — Ambulatory Visit: Payer: Medicare PPO | Admitting: Cardiology

## 2013-06-20 ENCOUNTER — Ambulatory Visit (INDEPENDENT_AMBULATORY_CARE_PROVIDER_SITE_OTHER): Payer: Medicare PPO | Admitting: Cardiology

## 2013-06-20 VITALS — BP 104/78 | HR 111 | Ht 60.0 in | Wt 124.0 lb

## 2013-06-20 DIAGNOSIS — I4891 Unspecified atrial fibrillation: Secondary | ICD-10-CM

## 2013-06-20 DIAGNOSIS — I10 Essential (primary) hypertension: Secondary | ICD-10-CM

## 2013-06-20 NOTE — Patient Instructions (Signed)
Your physician recommends that you continue on your current medications as directed. Please refer to the Current Medication list given to you today.  Your physician recommends that you schedule a follow-up appointment in: 2 months 

## 2013-06-20 NOTE — Progress Notes (Signed)
[address] [place], [place]  [address] Phone: [phone] Fax:  [phone]  Date:  06/20/2013   ID:  [name], DOB 27-Jul-1928, MRN [id number]  PCP:  Emeterio Reeve, MD   History of Present Illness: [name] is a 79 y.o. female with aortic dissection thoracic repair by Dr. Maren Beach with atrial fibrillation, intramural hematoma of thoracic aorta not allowing chronic anticoagulation, chronic combined systolic/diastolic heart failure with ejection fraction of 40-45% on 05/09/13 here for followup.  At previous visits, her blood pressure was on the low side. Her Toprol had been cut significantly. She ended up having increase heart rate with her atrial fibrillation and I increased her Toprol once again back to 50 XL twice a day. Her heart rate is much improved at today's visit and her blood pressure is currently reasonable. She denies any dizziness, syncope.  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 They do move. Perhaps secondary to scarring from frequent IVs in the hospital setting. She does state that she has noted this since the surgery. She does not think that they are increasing in size. She is also been battling worsening hoarseness/decreased volume in her voice.  I had a vascular probe placed on the left antecubital fossa and the artery appears normal. No evidence of a large fistula. There is soft tissue underlying the artery.  Wt Readings from Last 3 Encounters:  06/20/13 124 lb (56.246 kg)  06/18/13 125 lb (56.7 kg)  06/03/13 125 lb 6.4 oz (56.881 kg)     Past Medical History  Diagnosis Date  . Afib   . Aortic root aneurysm   . Hypertension   . CHF (congestive heart failure)   . Shortness of breath   . Aortic atherosclerosis 06/03/2013    Past Surgical History  Procedure Laterality Date  . Bentall procedure N/A 11/11/2012    Procedure: BENTALL PROCEDURE;  Surgeon: Kerin Perna, MD;  Location: [facility] OR;  Service: Open Heart Surgery;  Laterality: N/A;  *CIRC ARREST*   . Intraoperative transesophageal echocardiogram N/A 11/11/2012    Procedure: INTRAOPERATIVE TRANSESOPHAGEAL ECHOCARDIOGRAM;  Surgeon: Kerin Perna, MD;  Location: [facility] OR;  Service: Open Heart Surgery;  Laterality: N/A;  . Ascending aortic aneurysm repair  10/2012    Current Outpatient Prescriptions  Medication Sig Dispense Refill  . amiodarone (PACERONE) 200 MG tablet Take 200 mg by mouth daily.      . Ascorbic Acid (VITAMIN C) 1000 MG tablet Take 1,000 mg by mouth daily.      Marland Kitchen aspirin 81 MG tablet Take 81 mg by mouth daily.      . furosemide (LASIX) 20 MG tablet Take 40 mg by mouth daily.       . metoprolol succinate (TOPROL-XL) 50 MG 24 hr tablet Take 50 mg by mouth daily. Take with or immediately following a meal.      . acetaminophen (TYLENOL) 325 MG tablet Take 2 tablets (650 mg total) by mouth every 6 (six) hours as needed for pain.      . fentaNYL (DURAGESIC - DOSED MCG/HR) 25 MCG/HR patch Place 1 patch (25 mcg total) onto the skin every 3 (three) days.  10 patch  0   No current facility-administered medications for this visit.    Allergies:   No Known Allergies  Social History:  The patient  reports that she has never smoked. She has never used smokeless  tobacco. She reports that she does not drink alcohol or use illicit drugs.   ROS:  Please see the history of present illness.   Denies any fevers, chills, orthopnea, PND, syncope  All other systems reviewed and negative.   PHYSICAL EXAM: VS:  BP 104/78  Pulse 111  Ht 5' (1.524 m)  Wt 124 lb (56.246 kg)  BMI 24.22 kg/m2 Well nourished, well developed, in no acute distress HEENT: normal Neck: no JVD Cardiac:  Irregularly irregular, mildly elevated rate; no murmur Lungs:  clear to auscultation bilaterally, no wheezing, rhonchi or rales Abd: soft, nontender, no hepatomegaly Ext: mild edemaAntecubital fossa as described above in  history of present illness Skin: warm and dry Neuro: no focal abnormalities noted  EKG:  Atrial flutter/fibrillation heart rate 109, nonspecific ST-T wave changes, poor R wave progression, left axis deviation/prior inferior infarct pattern.     ASSESSMENT AND PLAN:  1. Atrial flutter/fibrillation-continuing with amiodarone, improved rate control with increase in Toprol back to 50 mg twice a day. She is currently not on Coumadin because of intramural hematoma of aorta which expanded. I explained this to them at length. She is at increased risk of stroke from A. fib standpoint. Aspirin only. 81 mg. It also is in 2. Aortic dissection repair/Bentall procedure-doing well. Prior note reviewed. 3. Intramural hematoma of aorta. Unable to give Coumadin because of this. 4. Hypertension/hypotension-ACE inhibitor off. Limiting.Combined systolic/diastolic heart failure-EF 40-45%. Trying to rate control atrial flutter/fibrillation as best as possible. Unable to utilize ACE inhibitor because of hypotension. 5. Cough/hoarseness-interestingly, she did not have this immediately after the surgery she states. I agree that this may be recurrent laryngeal nerve but I would've expected this immediately after surgery. Could this be GERD related or excess mucus production especially associated with cough. ACE inhibitor has been off. 6. We will see her back in one month for blood pressure followup. I appreciate team work with Dr. Maren Beach. I am also curious of Dr. Lorrin Mais opinion of her antecubital fossa. I've asked her to discuss this with him.  Signed, Donato Schultz, MD [facility]  06/20/2013 11:50 AM

## 2013-06-23 NOTE — Telephone Encounter (Signed)
Patient is aware of medication change based on 06/20/2013 office visit .Marland KitchenMarland Kitchen

## 2013-06-25 ENCOUNTER — Ambulatory Visit: Payer: Medicare PPO | Admitting: Cardiothoracic Surgery

## 2013-06-30 ENCOUNTER — Other Ambulatory Visit: Payer: Self-pay | Admitting: *Deleted

## 2013-06-30 DIAGNOSIS — I719 Aortic aneurysm of unspecified site, without rupture: Secondary | ICD-10-CM

## 2013-07-02 ENCOUNTER — Ambulatory Visit: Payer: Commercial Managed Care - HMO | Admitting: Cardiothoracic Surgery

## 2013-07-09 ENCOUNTER — Ambulatory Visit: Payer: Medicare PPO | Admitting: Cardiology

## 2013-07-23 ENCOUNTER — Ambulatory Visit (INDEPENDENT_AMBULATORY_CARE_PROVIDER_SITE_OTHER): Payer: Medicare HMO | Admitting: Cardiology

## 2013-07-23 ENCOUNTER — Encounter: Payer: Self-pay | Admitting: Cardiology

## 2013-07-23 ENCOUNTER — Telehealth: Payer: Self-pay | Admitting: Cardiology

## 2013-07-23 VITALS — BP 112/82 | HR 113 | Ht 60.0 in | Wt 125.0 lb

## 2013-07-23 DIAGNOSIS — I10 Essential (primary) hypertension: Secondary | ICD-10-CM

## 2013-07-23 DIAGNOSIS — I4891 Unspecified atrial fibrillation: Secondary | ICD-10-CM

## 2013-07-23 DIAGNOSIS — I719 Aortic aneurysm of unspecified site, without rupture: Secondary | ICD-10-CM

## 2013-07-23 DIAGNOSIS — I429 Cardiomyopathy, unspecified: Secondary | ICD-10-CM

## 2013-07-23 DIAGNOSIS — I7100 Dissection of unspecified site of aorta: Secondary | ICD-10-CM

## 2013-07-23 DIAGNOSIS — I428 Other cardiomyopathies: Secondary | ICD-10-CM

## 2013-07-23 MED ORDER — DILTIAZEM HCL ER COATED BEADS 120 MG PO CP24: 120.0000 mg | ORAL_CAPSULE | Freq: Every day | ORAL | Status: DC

## 2013-07-23 NOTE — Progress Notes (Signed)
[address] [place], [place]  [address] Phone: [phone] Fax:  [phone]  Date:  07/23/2013   ID:  [name], DOB 26-Jun-1928, MRN [id number]  PCP:  Emeterio Reeve, MD   History of Present Illness: [name] is a 79 y.o. female with atrial fibrillation/flutter, aortic dissection thoracic repair by Dr. Maren Beach with atrial fibrillation, intramural hematoma of thoracic aorta not allowing chronic anticoagulation, chronic combined systolic/diastolic heart failure with ejection fraction of 40-45% on 05/09/13 here for followup.  Today having trouble sleeping because of excessive palpitations. EKG shows atrial fibrillation/flutter heart rate 113.   At previous visits, her blood pressure was on the low side. Her Toprol was cut significantly. She ended up having increase heart rate with her atrial fibrillation and I increased her Toprol once again back to 50 XL twice a day. Her heart rate is much improved previously but once again is increased. She denies any dizziness, syncope.   Wt Readings from Last 3 Encounters:  07/23/13 125 lb (56.7 kg)  06/20/13 124 lb (56.246 kg)  06/18/13 125 lb (56.7 kg)     Past Medical History  Diagnosis Date  . Afib   . Aortic root aneurysm   . Hypertension   . CHF (congestive heart failure)   . Shortness of breath   . Aortic atherosclerosis 06/03/2013    Past Surgical History  Procedure Laterality Date  . Bentall procedure N/A 11/11/2012    Procedure: BENTALL PROCEDURE;  Surgeon: Kerin Perna, MD;  Location: [facility] OR;  Service: Open Heart Surgery;  Laterality: N/A;  *CIRC ARREST*   . Intraoperative transesophageal echocardiogram N/A 11/11/2012    Procedure: INTRAOPERATIVE TRANSESOPHAGEAL ECHOCARDIOGRAM;  Surgeon: Kerin Perna, MD;  Location: [facility] OR;  Service: Open Heart Surgery;  Laterality: N/A;  . Ascending aortic aneurysm repair  10/2012    Current Outpatient Prescriptions  Medication Sig Dispense Refill  .  amiodarone (PACERONE) 200 MG tablet Take 200 mg by mouth daily.      Marland Kitchen aspirin 81 MG tablet Take 81 mg by mouth daily.      . furosemide (LASIX) 40 MG tablet Take 40 mg by mouth.      . metoprolol succinate (TOPROL-XL) 50 MG 24 hr tablet Take 50 mg by mouth 2 (two) times daily. Take with or immediately following a meal.       No current facility-administered medications for this visit.    Allergies:   No Known Allergies  Social History:  The patient  reports that she has never smoked. She has never used smokeless tobacco. She reports that she does not drink alcohol or use illicit drugs.   ROS:  Please see the history of present illness.   Denies any fevers, chills, orthopnea, PND, syncope  All other systems reviewed and negative.   PHYSICAL EXAM: VS:  BP 112/82  Ht 5' (1.524 m)  Wt 125 lb (56.7 kg)  BMI 24.41 kg/m2 Well nourished, well developed, in no acute distress HEENT: normal Neck: no JVD Cardiac:  Irregularly irregular, mildly elevated rate; no murmur Lungs:  clear to auscultation bilaterally, no wheezing, rhonchi or rales Abd: soft, nontender, no hepatomegaly Ext: minimal edemaSkin: warm and dry Neuro: no focal abnormalities noted  EKG:  06/20/13: Atrial flutter/fibrillation heart rate 109, nonspecific ST-T wave changes, poor R wave progression, left axis deviation/prior inferior infarct pattern.     ASSESSMENT AND PLAN:  1. Atrial flutter/fibrillation- her rate has increased once again. Continuing with  amiodarone, metoprolol extended release 50 mg twice a day. I'm going to add diltiazem CD 120 mg once a day to her drug regimen as well. She has been on this in the past in June of 2014. I am aware of her previous bout of hypotension. Nonetheless, her heart rate still seems to be elevated despite our rate controlling efforts. It looks like she has been in atrial fibrillation since July. I do not feel comfortable cardioverting her because we cannot place her on anticoagulation She  is currently not on Coumadin because of intramural hematoma of aorta which expanded. Aspirin only. 81 mg.  2. Aortic dissection repair/Bentall procedure-doing well. Prior note reviewed. 3. Intramural hematoma of aorta. Unable to give Coumadin because of this. 4. Hypertension/hypotension-ACE inhibitor off. Limiting.Combined systolic/diastolic heart failure-EF 40-45%. Trying to rate control atrial flutter/fibrillation as best as possible. Unable to utilize ACE inhibitor because of hypotension. 5. Cough/hoarseness-interestingly, she did not have this immediately after the surgery she states. I agree that this may be recurrent laryngeal nerve but I would've expected this immediately after surgery. Could this be GERD related or excess mucus production especially associated with cough. ACE inhibitor has been off.  we will see her back next Tuesday. Signed, Donato Schultz, MD [facility]  07/23/2013 1:21 PM

## 2013-07-23 NOTE — Patient Instructions (Signed)
Your physician has recommended you make the following change in your medication:   1. Start Diltiazem  CD 120mg  once daily.  Your physician recommends that you schedule a follow-up appointment for 07/29/2013 @ 8:45 a.m.

## 2013-07-23 NOTE — Telephone Encounter (Signed)
New problem:  Pt's daughter, Meliton Rattan, states her mom is here for her appt. Irem  wants to explain on her mom's behalf what her symptoms have been.. She states her mom was c/o of palpitations this morning and she had trouble sleeping last night. Pt daughter states her mom threw up this morning and had a headach this morning. Irem just wanted the doctor to be aware.

## 2013-07-24 NOTE — Telephone Encounter (Signed)
Spoke with patients daughter, patient was in the office the day after symptoms, feeling a lot better to day, daughter will fill the patients diltiazem 120mg   Today so the patient can began to take the medication once daily.

## 2013-07-29 ENCOUNTER — Ambulatory Visit (INDEPENDENT_AMBULATORY_CARE_PROVIDER_SITE_OTHER): Payer: Medicare HMO | Admitting: Cardiology

## 2013-07-29 ENCOUNTER — Encounter: Payer: Self-pay | Admitting: Cardiology

## 2013-07-29 VITALS — BP 120/84 | HR 56 | Ht 64.0 in | Wt 128.0 lb

## 2013-07-29 DIAGNOSIS — I429 Cardiomyopathy, unspecified: Secondary | ICD-10-CM

## 2013-07-29 DIAGNOSIS — I4891 Unspecified atrial fibrillation: Secondary | ICD-10-CM

## 2013-07-29 DIAGNOSIS — I428 Other cardiomyopathies: Secondary | ICD-10-CM

## 2013-07-29 NOTE — Patient Instructions (Signed)
Your physician recommends that you continue on your current medications as directed. Please refer to the Current Medication list given to you today.  Keep January Appt. with Dr. Syliva Overman.

## 2013-07-29 NOTE — Progress Notes (Signed)
[address] [place], [place]  [address] Phone: [phone] Fax:  [phone]  Date:  07/29/2013   ID:  [name], DOB 08/06/1928, MRN [id number]  PCP:  Emeterio Reeve, MD   History of Present Illness: [name] is a 79 y.o. female with atrial fibrillation/flutter, aortic dissection thoracic repair by Dr. Maren Beach with atrial fibrillation, intramural hematoma of thoracic aorta not allowing chronic anticoagulation, chronic combined systolic/diastolic heart failure with ejection fraction of 40-45% on 05/09/13 here for followup.  She feels much better after starting low-dose diltiazem CD 120 mg once a day. She can rest better. Her heart rate is improved. EKG 07/23/13 shows atrial fibrillation/flutter heart rate 113.   At previous visits, her blood pressure was on the low side. Her Toprol was cut significantly. She ended up having increase heart rate with her atrial fibrillation and I increased her Toprol once again back to 50 XL twice a day. She denies any dizziness, syncope.   Wt Readings from Last 3 Encounters:  07/29/13 128 lb (58.06 kg)  07/23/13 125 lb (56.7 kg)  06/20/13 124 lb (56.246 kg)     Past Medical History  Diagnosis Date  . Afib   . Aortic root aneurysm   . Hypertension   . CHF (congestive heart failure)   . Shortness of breath   . Aortic atherosclerosis 06/03/2013    Past Surgical History  Procedure Laterality Date  . Bentall procedure N/A 11/11/2012    Procedure: BENTALL PROCEDURE;  Surgeon: Kerin Perna, MD;  Location: [facility] OR;  Service: Open Heart Surgery;  Laterality: N/A;  *CIRC ARREST*   . Intraoperative transesophageal echocardiogram N/A 11/11/2012    Procedure: INTRAOPERATIVE TRANSESOPHAGEAL ECHOCARDIOGRAM;  Surgeon: Kerin Perna, MD;  Location: [facility] OR;  Service: Open Heart Surgery;  Laterality: N/A;  . Ascending aortic aneurysm repair  10/2012    Current Outpatient Prescriptions  Medication Sig Dispense Refill  .  amiodarone (PACERONE) 200 MG tablet Take 200 mg by mouth daily.      Marland Kitchen aspirin 81 MG tablet Take 81 mg by mouth daily.      Marland Kitchen diltiazem (CARDIZEM CD) 120 MG 24 hr capsule Take 1 capsule (120 mg total) by mouth daily.  90 capsule  3  . furosemide (LASIX) 40 MG tablet Take 40 mg by mouth.      . metoprolol succinate (TOPROL-XL) 50 MG 24 hr tablet Take 50 mg by mouth 2 (two) times daily. Take with or immediately following a meal.       No current facility-administered medications for this visit.    Allergies:   No Known Allergies  Social History:  The patient  reports that she has never smoked. She has never used smokeless tobacco. She reports that she does not drink alcohol or use illicit drugs.   ROS:  Please see the history of present illness.   Denies any fevers, chills, orthopnea, PND, syncope  All other systems reviewed and negative.   PHYSICAL EXAM: VS:  BP 120/84  Pulse 56  Ht 5\' 4"  (1.626 m)  Wt 128 lb (58.06 kg)  BMI 21.96 kg/m2 Well nourished, well developed, in no acute distress HEENT: normal Neck: no JVD Cardiac:  Irregularly irregular, improved HR; no murmur Lungs:  clear to auscultation bilaterally, no wheezing, rhonchi or rales Abd: soft, nontender, no hepatomegaly Ext: minimal edemaSkin: warm and dry Neuro: no focal abnormalities noted  EKG:  06/20/13: Atrial flutter/fibrillation heart rate 109, nonspecific ST-T  wave changes, poor R wave progression, left axis deviation/prior inferior infarct pattern.     ASSESSMENT AND PLAN:  1. Atrial flutter/fibrillation- her rate has improved with additional diltiazem CD 120 mg once a day in conjunction with her Toprol-XL 50 mg twice a day. She has been on this in the past in June of 2014. I am aware of her previous bout of hypotension. It looks like she has been in atrial fibrillation since July. I do not feel comfortable cardioverting her because we cannot place her on anticoagulation She is currently not on Coumadin because of  intramural hematoma of aorta which expanded. Aspirin only. 81 mg.  2. Aortic dissection repair/Bentall procedure-doing well. Prior note reviewed. 3. Intramural hematoma of aorta. Unable to give Coumadin because of this. 4. Hypertension/hypotension-ACE inhibitor off. Limiting.Combined systolic/diastolic heart failure-EF 40-45%. Trying to rate control atrial flutter/fibrillation as best as possible. Unable to utilize ACE inhibitor because of hypotension. 5. Cough/hoarseness-interestingly, she did not have this immediately after the surgery she states. I agree that this may be recurrent laryngeal nerve but I would've expected this immediately after surgery. Could this be GERD related or excess mucus production especially associated with cough. ACE inhibitor has been off.  Signed, Donato Schultz, MD [facility]  07/29/2013 9:10 AM

## 2013-07-30 ENCOUNTER — Ambulatory Visit: Payer: Medicare HMO | Admitting: Cardiology

## 2013-08-01 ENCOUNTER — Emergency Department (HOSPITAL_COMMUNITY): Payer: Medicare HMO

## 2013-08-01 ENCOUNTER — Emergency Department (HOSPITAL_COMMUNITY)
Admission: EM | Admit: 2013-08-01 | Discharge: 2013-08-01 | Disposition: A | Discharge status: Home / Self Care | Payer: Medicare HMO | Attending: Emergency Medicine | Admitting: Emergency Medicine

## 2013-08-01 ENCOUNTER — Encounter (HOSPITAL_COMMUNITY): Payer: Self-pay | Admitting: Emergency Medicine

## 2013-08-01 DIAGNOSIS — I509 Heart failure, unspecified: Secondary | ICD-10-CM | POA: Insufficient documentation

## 2013-08-01 DIAGNOSIS — Z9889 Other specified postprocedural states: Secondary | ICD-10-CM | POA: Insufficient documentation

## 2013-08-01 DIAGNOSIS — I10 Essential (primary) hypertension: Secondary | ICD-10-CM | POA: Insufficient documentation

## 2013-08-01 DIAGNOSIS — Z7982 Long term (current) use of aspirin: Secondary | ICD-10-CM | POA: Insufficient documentation

## 2013-08-01 DIAGNOSIS — R062 Wheezing: Secondary | ICD-10-CM | POA: Insufficient documentation

## 2013-08-01 DIAGNOSIS — J3489 Other specified disorders of nose and nasal sinuses: Secondary | ICD-10-CM | POA: Insufficient documentation

## 2013-08-01 DIAGNOSIS — Z79899 Other long term (current) drug therapy: Secondary | ICD-10-CM | POA: Insufficient documentation

## 2013-08-01 DIAGNOSIS — R609 Edema, unspecified: Secondary | ICD-10-CM | POA: Insufficient documentation

## 2013-08-01 DIAGNOSIS — I4891 Unspecified atrial fibrillation: Secondary | ICD-10-CM

## 2013-08-01 LAB — CBC
HCT: 39.9 % (ref 36.0–46.0)
Hemoglobin: 13.2 g/dL (ref 12.0–15.0)
MCH: 31.5 pg (ref 26.0–34.0)
MCHC: 33.1 g/dL (ref 30.0–36.0)
MCV: 95.2 fL (ref 78.0–100.0)
Platelets: 294 10*3/uL (ref 150–400)
RBC: 4.19 MIL/uL (ref 3.87–5.11)
RDW: 15.2 % (ref 11.5–15.5)
WBC: 5.8 10*3/uL (ref 4.0–10.5)

## 2013-08-01 LAB — BASIC METABOLIC PANEL
BUN: 15 mg/dL (ref 6–23)
CO2: 27 mEq/L (ref 19–32)
Calcium: 9 mg/dL (ref 8.4–10.5)
Chloride: 96 mEq/L (ref 96–112)
Creatinine, Ser: 0.95 mg/dL (ref 0.50–1.10)
GFR calc Af Amer: 62 mL/min — ABNORMAL LOW (ref >90)
GFR calc non Af Amer: 54 mL/min — ABNORMAL LOW (ref >90)
Glucose, Bld: 111 mg/dL — ABNORMAL HIGH (ref 70–99)
Potassium: 3.2 mEq/L — ABNORMAL LOW (ref 3.5–5.1)
Sodium: 134 mEq/L — ABNORMAL LOW (ref 135–145)

## 2013-08-01 LAB — POCT I-STAT TROPONIN I: Troponin i, poc: 0 ng/mL (ref 0.00–0.08)

## 2013-08-01 LAB — PRO B NATRIURETIC PEPTIDE: Pro B Natriuretic peptide (BNP): 9024 pg/mL — ABNORMAL HIGH (ref 0–450)

## 2013-08-01 MED ORDER — METOPROLOL TARTRATE 1 MG/ML IV SOLN
5.0000 mg | Freq: Once | INTRAVENOUS | Status: AC
  Administered 2013-08-01: 5 mg via INTRAVENOUS
  Filled 2013-08-01: qty 5

## 2013-08-01 NOTE — ED Notes (Signed)
Pt currently in xray but will come to room when done.

## 2013-08-01 NOTE — ED Notes (Signed)
Pt from PCP c/o cough x 2 days with HA and congestion; pt sent here for eval for possible CHF with hx of same in past

## 2013-08-01 NOTE — Discharge Instructions (Signed)
Take 1 lasix (furosemide) in the morning and night until you see the cardiologist.  Return for worsening shortness of breath, chest pain, fever or other concerns.

## 2013-08-01 NOTE — ED Provider Notes (Signed)
CSN: [id number]     Arrival date & time 08/01/13  1307 History   First MD Initiated Contact with Patient 08/01/13 1343     Chief Complaint  Patient presents with  . Cough   (Consider location/radiation/quality/duration/timing/severity/associated sxs/prior Treatment) Patient is a 79 y.o. female presenting with cough. The history is provided by the patient.  Cough Cough characteristics:  Non-productive Severity:  Moderate Onset quality:  Gradual Duration:  2 days Timing:  Constant Progression:  Unchanged Chronicity:  New Smoker: no   Context: upper respiratory infection   Context: not sick contacts   Context comment:  Congestion Relieved by:  None tried Worsened by:  Lying down Ineffective treatments:  None tried Associated symptoms: rhinorrhea, shortness of breath and sinus congestion   Associated symptoms: no chest pain, no chills, no fever, no weight loss and no wheezing     Past Medical History  Diagnosis Date  . Afib   . Aortic root aneurysm   . Hypertension   . CHF (congestive heart failure)   . Shortness of breath   . Aortic atherosclerosis 06/03/2013   Past Surgical History  Procedure Laterality Date  . Bentall procedure N/A 11/11/2012    Procedure: BENTALL PROCEDURE;  Surgeon: Kerin Perna, MD;  Location: [facility] OR;  Service: Open Heart Surgery;  Laterality: N/A;  *CIRC ARREST*   . Intraoperative transesophageal echocardiogram N/A 11/11/2012    Procedure: INTRAOPERATIVE TRANSESOPHAGEAL ECHOCARDIOGRAM;  Surgeon: Kerin Perna, MD;  Location: [facility] OR;  Service: Open Heart Surgery;  Laterality: N/A;  . Ascending aortic aneurysm repair  10/2012   Family History  Problem Relation Age of Onset  . Heart attack Neg Hx   . Heart disease Neg Hx   . Heart failure Neg Hx   . Hypertension Neg Hx    History  Substance Use Topics  . Smoking status: Never Smoker   . Smokeless tobacco: Never Used  . Alcohol Use: No   OB History   Grav Para Term Preterm Abortions TAB SAB  Ect Mult Living                 Review of Systems  Constitutional: Negative for fever, chills and weight loss.  HENT: Positive for rhinorrhea.   Respiratory: Positive for cough and shortness of breath. Negative for wheezing.   Cardiovascular: Negative for chest pain.  All other systems reviewed and are negative.    Allergies  Review of patient's allergies indicates no known allergies.  Home Medications   Current Outpatient Rx  Name  Route  Sig  Dispense  Refill  . acetaminophen (TYLENOL) 325 MG tablet   Oral   Take 325 mg by mouth every 6 (six) hours as needed.         Marland Kitchen amiodarone (PACERONE) 200 MG tablet   Oral   Take 200 mg by mouth daily.         . Ascorbic Acid (VITAMIN C) 1000 MG tablet   Oral   Take 1,000 mg by mouth daily.         Marland Kitchen aspirin 81 MG tablet   Oral   Take 81 mg by mouth daily.         Marland Kitchen diltiazem (DILACOR XR) 120 MG 24 hr capsule   Oral   Take 120 mg by mouth daily.         . furosemide (LASIX) 40 MG tablet   Oral   Take 40 mg by mouth.         Marland Kitchen  metoprolol succinate (TOPROL-XL) 50 MG 24 hr tablet   Oral   Take 50 mg by mouth 2 (two) times daily. Take with or immediately following a meal.         . Vitamin D, Ergocalciferol, (DRISDOL) 50000 UNITS CAPS capsule   Oral   Take 50,000 Units by mouth every 7 (seven) days.          BP 117/87  Pulse 107  Temp(Src) 97.3 F (36.3 C) (Oral)  Resp 24  SpO2 96% Physical Exam  Nursing note and vitals reviewed. Constitutional: She is oriented to person, place, and time. She appears well-developed and well-nourished. No distress.  HENT:  Head: Normocephalic and atraumatic.  Mouth/Throat: Oropharynx is clear and moist.  Eyes: Conjunctivae and EOM are normal. Pupils are equal, round, and reactive to light.  Neck: Normal range of motion. Neck supple.  Cardiovascular: Normal rate, regular rhythm and intact distal pulses.   No murmur heard. Pulmonary/Chest: Effort normal. No  respiratory distress. She has wheezes. She has rales in the right lower field and the left lower field.  Abdominal: Soft. She exhibits no distension. There is no tenderness. There is no rebound and no guarding.  Musculoskeletal: Normal range of motion. She exhibits edema. She exhibits no tenderness.  Trace edema of bilateral lower ext  Neurological: She is alert and oriented to person, place, and time.  Skin: Skin is warm and dry. No rash noted. No erythema.  Psychiatric: She has a normal mood and affect. Her behavior is normal.    ED Course  Procedures (including critical care time) Labs Review Labs Reviewed  BASIC METABOLIC PANEL - Abnormal; Notable for the following:    Sodium 134 (*)    Potassium 3.2 (*)    Glucose, Bld 111 (*)    GFR calc non Af Amer 54 (*)    GFR calc Af Amer 62 (*)    All other components within normal limits  PRO B NATRIURETIC PEPTIDE - Abnormal; Notable for the following:    Pro B Natriuretic peptide (BNP) 9024.0 (*)    All other components within normal limits  CBC  POCT I-STAT TROPONIN I   Imaging Review Dg Chest 2 View  08/01/2013   CLINICAL DATA:  Cough.  Weakness.  EXAM: CHEST  2 VIEW  COMPARISON:  05/28/2013  FINDINGS: Cardiac silhouette is enlarged but stable. Stable changes are noted from a prior median sternotomy. A aorta is diffusely enlarged reflecting the known aneurysm. Is also uncoiled. No mediastinal or hilar masses.  Lungs are hyperexpanded. Mild reticular scarring and/or subsegmental atelectasis is noted in the bases. The lungs are otherwise clear. No pleural effusion or pneumothorax.  The bony thorax is demineralized but intact.  IMPRESSION: No acute cardiopulmonary disease. Stable appearance from the prior study.   Electronically Signed   By: Amie Portland M.D.   On: 08/01/2013 13:41    EKG Interpretation    Date/Time:  Friday August 01 2013 13:21:18 EST Ventricular Rate:  122 PR Interval:    QRS Duration: 80 QT Interval:  364 QTC  Calculation: 518 R Axis:   -78 Text Interpretation:  Atrial fibrillation with rapid ventricular response Left axis deviation Low voltage QRS Inferior infarct , age undetermined Anterolateral infarct , age undetermined No significant change since last tracing Confirmed by Anitra Lauth  MD, Arlette Schaad ([id number]) on 08/01/2013 2:57:59 PM            MDM  No diagnosis found.  Pt here c/o of SOB and cough  worse with lying down for the last 2 days.  Pt denies weight gain, fever or productive cough.  Pt does have hx of CHF and aortic aneurysm but say PCP today and sent here for eval for CHF.  Pt does have some congestion and HA.  On arrival here in a.fib RVR with improvement with rest to the 110's.  Denies any CP and sx worse with lying down.  Pt has some mild lower ext edema and minimal rales in the RLL and scant wheezes.  Will evaluate for CHF.  3:39 PM Labs consistent with CHF.  Sats wnl.  Pt not in acute distress on exam.  This may be from uncontrolled a.fib.  Saw Dr. Anne Fu yesterday and recently started on cardizem.  Discussed with Cardiology and they will f/u with pt on Monday and will increase lasix to 80mg  over the weekend and see cards on Monday or tues.  Family is present and agrees with plan.  At this time will have pt continue cardizem and metoprolol.  Gwyneth Sprout, MD 08/01/13 1556

## 2013-08-01 NOTE — ED Notes (Signed)
Confirmed lopressor order with Dr. Anitra Lauth

## 2013-08-06 ENCOUNTER — Ambulatory Visit (INDEPENDENT_AMBULATORY_CARE_PROVIDER_SITE_OTHER): Payer: Medicare HMO | Admitting: Cardiology

## 2013-08-06 ENCOUNTER — Encounter: Payer: Self-pay | Admitting: Cardiology

## 2013-08-06 VITALS — BP 118/70 | HR 91 | Ht 64.0 in | Wt 121.0 lb

## 2013-08-06 DIAGNOSIS — I509 Heart failure, unspecified: Secondary | ICD-10-CM

## 2013-08-06 DIAGNOSIS — E876 Hypokalemia: Secondary | ICD-10-CM | POA: Insufficient documentation

## 2013-08-06 DIAGNOSIS — I719 Aortic aneurysm of unspecified site, without rupture: Secondary | ICD-10-CM

## 2013-08-06 DIAGNOSIS — I7100 Dissection of unspecified site of aorta: Secondary | ICD-10-CM

## 2013-08-06 DIAGNOSIS — I4891 Unspecified atrial fibrillation: Secondary | ICD-10-CM

## 2013-08-06 DIAGNOSIS — I5043 Acute on chronic combined systolic (congestive) and diastolic (congestive) heart failure: Secondary | ICD-10-CM

## 2013-08-06 HISTORY — DX: Hypokalemia: E87.6

## 2013-08-06 LAB — BASIC METABOLIC PANEL
BUN: 24 mg/dL — ABNORMAL HIGH (ref 6–23)
CO2: 31 mEq/L (ref 19–32)
Calcium: 8.8 mg/dL (ref 8.4–10.5)
Chloride: 99 mEq/L (ref 96–112)
Creatinine, Ser: 1.3 mg/dL — ABNORMAL HIGH (ref 0.4–1.2)
GFR: 41.5 mL/min — ABNORMAL LOW (ref >60.00)
Glucose, Bld: 110 mg/dL — ABNORMAL HIGH (ref 70–99)
Potassium: 2.5 mEq/L — CL (ref 3.5–5.1)
Sodium: 140 mEq/L (ref 135–145)

## 2013-08-06 MED ORDER — POTASSIUM CHLORIDE ER 20 MEQ PO TBCR: 40.0000 meq | EXTENDED_RELEASE_TABLET | Freq: Two times a day (BID) | ORAL | Status: DC

## 2013-08-06 MED ORDER — FUROSEMIDE 40 MG PO TABS: 40.0000 mg | ORAL_TABLET | Freq: Every day | ORAL | Status: DC

## 2013-08-06 NOTE — Patient Instructions (Addendum)
Your physician recommends that you schedule a follow-up appointment in: WITH DR. Anne Fu BEFORE THE [facility] OF [facility] 2015  Your physician has recommended you make the following change in your medication:   START LASIX 40 MG ONCE A DAY

## 2013-08-06 NOTE — Progress Notes (Signed)
[address] [place], [place]  [address] Phone: [phone] Fax:  [phone]  Date:  08/06/2013   ID:  [name], DOB 11-08-1927, MRN [id number]  PCP:  Emeterio Reeve, MD   History of Present Illness: [name] is a 79 y.o. female with atrial fibrillation/flutter, aortic dissection thoracic repair by Dr. Maren Beach with atrial fibrillation, intramural hematoma of thoracic aorta not allowing chronic anticoagulation, chronic combined systolic/diastolic heart failure with ejection fraction of 40-45% on 05/09/13 here for followup.    At previous visits, her blood pressure was on the low side. Her Toprol was cut significantly. She ended up having increase heart rate with her atrial fibrillation and I increased her Toprol once again back to 50 XL twice a day. Her heart rate is much improved previously but once again was increased diltiazem CD 120 mg was added. She denies any dizziness, syncope.  She did go to the emergency room 3 days after last office visit on 08/01/13 and was felt to be in a degree of heart failure. Her Lasix was increased to 80 mg over the weekend. She had mild lower extremity edema and minimal rales in the right lower lobes. A. Fib was improved with rest.     Wt Readings from Last 3 Encounters:  08/06/13 121 lb (54.885 kg)  07/29/13 128 lb (58.06 kg)  07/23/13 125 lb (56.7 kg)     Past Medical History  Diagnosis Date  . Afib   . Aortic root aneurysm   . Hypertension   . CHF (congestive heart failure)   . Shortness of breath   . Aortic atherosclerosis 06/03/2013    Past Surgical History  Procedure Laterality Date  . Bentall procedure N/A 11/11/2012    Procedure: BENTALL PROCEDURE;  Surgeon: Kerin Perna, MD;  Location: [facility] OR;  Service: Open Heart Surgery;  Laterality: N/A;  *CIRC ARREST*   . Intraoperative transesophageal echocardiogram N/A 11/11/2012    Procedure: INTRAOPERATIVE TRANSESOPHAGEAL ECHOCARDIOGRAM;  Surgeon: Kerin Perna, MD;  Location: [facility] OR;  Service: Open Heart Surgery;  Laterality: N/A;  . Ascending aortic aneurysm repair  10/2012    Current Outpatient Prescriptions  Medication Sig Dispense Refill  . acetaminophen (TYLENOL) 325 MG tablet Take 325 mg by mouth every 6 (six) hours as needed.      Marland Kitchen amiodarone (PACERONE) 200 MG tablet Take 200 mg by mouth daily.      . Ascorbic Acid (VITAMIN C) 1000 MG tablet Take 1,000 mg by mouth daily.      Marland Kitchen aspirin 81 MG tablet Take 81 mg by mouth daily.      Marland Kitchen diltiazem (DILACOR XR) 120 MG 24 hr capsule Take 120 mg by mouth daily.      . furosemide (LASIX) 40 MG tablet Take 40 mg by mouth.      . metoprolol succinate (TOPROL-XL) 50 MG 24 hr tablet Take 50 mg by mouth 2 (two) times daily. Take with or immediately following a meal.      . Vitamin D, Ergocalciferol, (DRISDOL) 50000 UNITS CAPS capsule Take 50,000 Units by mouth every 7 (seven) days.       No current facility-administered medications for this visit.    Allergies:   No Known Allergies  Social History:  The patient  reports that she has never smoked. She has never used smokeless tobacco. She reports that she does not drink alcohol or use illicit drugs.   ROS:  Please see  the history of present illness.   Denies any fevers, chills, orthopnea, PND, syncope  All other systems reviewed and negative.   PHYSICAL EXAM: VS:  Ht 5\' 4"  (1.626 m)  Wt 121 lb (54.885 kg)  BMI 20.76 kg/m2 Well nourished, well developed, in no acute distress HEENT: normal Neck: no JVD Cardiac:  Irregularly irregular,normal rate; no murmur Lungs:  clear to auscultation bilaterally, no wheezing, rhonchi or rales Abd: soft, nontender, no hepatomegaly Ext: minimal edemaSkin: warm and dry Neuro: no focal abnormalities noted  EKG:  06/20/13: Atrial flutter/fibrillation heart rate 109, nonspecific ST-T wave changes, poor R wave progression, left axis deviation/prior inferior infarct pattern.   08/06/13-atrial flutter pattern with  variable conduction, heart rate 91 beats per minute.   ASSESSMENT AND PLAN:  1. Atrial flutter/fibrillation- Continuing with amiodarone, metoprolol extended release 50 mg twice a day and diltiazem CD 120 mg once a day to her drug regimen as well. She has been on this in the past in June of 2014. I am aware of her previous bout of hypotension. Nonetheless, her heart rate still seems better controlled. I do not feel comfortable cardioverting her because we cannot place her on anticoagulation She is currently not on Coumadin because of intramural hematoma of aorta which expanded. Aspirin only. 81 mg. 2. Acute on chronic diastolic/systolic heart failure-ER visit from 08/01/13 reviewed. Lasix 40 mg twice a day was administered. Her weight is down approximately 6 pounds. I will continue with Lasix 40 mg once a day. I will check basic metabolic profile today. Her potassium was slightly low at previous hospitalization. We will add potassium if necessary. BNP was 9024.  3. Aortic dissection repair/Bentall procedure-doing well. Prior note reviewed. 4. Intramural hematoma of aorta. Unable to give Coumadin because of this. 5. Hypertension/hypotension-ACE inhibitor off. Limiting.Combined systolic/diastolic heart failure-EF 40-45%. Trying to rate control atrial flutter/fibrillation as best as possible. Unable to utilize ACE inhibitor because of hypotension. 6. Cough/hoarseness-interestingly, she did not have this immediately after the surgery she states. I agree that this may be recurrent laryngeal nerve but I would've expected this immediately after surgery. Could this be GERD related or excess mucus production especially associated with cough. ACE inhibitor has been off.    Signed, Donato Schultz, MD [facility]  08/06/2013 11:20 AM   Addendum: Potassium 2.5. Spoke to Daughter and prescribed K-Dur BID for 3 days then QD thereafter. Repeat BMET in one week after Dr. Maren Beach appt.

## 2013-08-08 ENCOUNTER — Telehealth: Payer: Self-pay | Admitting: Cardiology

## 2013-08-08 DIAGNOSIS — I509 Heart failure, unspecified: Secondary | ICD-10-CM

## 2013-08-08 MED ORDER — FUROSEMIDE 40 MG PO TABS: 40.0000 mg | ORAL_TABLET | Freq: Two times a day (BID) | ORAL | Status: DC

## 2013-08-08 NOTE — Telephone Encounter (Signed)
New problem    Pt's daughter/Irem Katrinka Blazing called pt is wezzing/sob when laying down, chest feels full.  Server coughing for the pass couple days.  Daughter needs a call back please.  Please call daughter Shelly's cell.

## 2013-08-08 NOTE — Telephone Encounter (Signed)
Daughter called stating Ms. Humphreys is not any better.  States her coughing is worse past 2 days.  Her chest feels "full" and hasn't been able to sleep at night.  Denies fever.  When takes deep breath she is wheezing.  Spoke w/Dr. Anne Fu who advises that she take extra Lasix 40 mg now and then tomorrow start Lasix 40 mg BID.  Scheduled for lab in Dr. Zenaida Niece Trigt's office on 12/31.  Advised daughter to call back if symptoms become worse or to take her to ER.  Daughter verbalizes understanding and will give her the extra dose of Lasix now.  She is taking the Potassium.

## 2013-08-09 ENCOUNTER — Emergency Department (HOSPITAL_COMMUNITY): Payer: Medicare HMO

## 2013-08-09 ENCOUNTER — Encounter (HOSPITAL_COMMUNITY): Payer: Self-pay | Admitting: Emergency Medicine

## 2013-08-09 ENCOUNTER — Inpatient Hospital Stay (HOSPITAL_COMMUNITY)
Admission: EM | Admit: 2013-08-09 | Discharge: 2013-08-11 | DRG: 292 | Disposition: A | Discharge status: Home / Self Care | Payer: Medicare HMO | Attending: Internal Medicine | Admitting: Internal Medicine

## 2013-08-09 DIAGNOSIS — I5043 Acute on chronic combined systolic (congestive) and diastolic (congestive) heart failure: Principal | ICD-10-CM

## 2013-08-09 DIAGNOSIS — I719 Aortic aneurysm of unspecified site, without rupture: Secondary | ICD-10-CM

## 2013-08-09 DIAGNOSIS — I7100 Dissection of unspecified site of aorta: Secondary | ICD-10-CM

## 2013-08-09 DIAGNOSIS — I429 Cardiomyopathy, unspecified: Secondary | ICD-10-CM

## 2013-08-09 DIAGNOSIS — N183 Chronic kidney disease, stage 3 unspecified: Secondary | ICD-10-CM

## 2013-08-09 DIAGNOSIS — I129 Hypertensive chronic kidney disease with stage 1 through stage 4 chronic kidney disease, or unspecified chronic kidney disease: Secondary | ICD-10-CM | POA: Diagnosis present

## 2013-08-09 DIAGNOSIS — I509 Heart failure, unspecified: Secondary | ICD-10-CM

## 2013-08-09 DIAGNOSIS — I10 Essential (primary) hypertension: Secondary | ICD-10-CM

## 2013-08-09 DIAGNOSIS — Q2543 Congenital aneurysm of aorta: Secondary | ICD-10-CM

## 2013-08-09 DIAGNOSIS — R0902 Hypoxemia: Secondary | ICD-10-CM

## 2013-08-09 DIAGNOSIS — I4891 Unspecified atrial fibrillation: Secondary | ICD-10-CM

## 2013-08-09 DIAGNOSIS — Z79899 Other long term (current) drug therapy: Secondary | ICD-10-CM

## 2013-08-09 DIAGNOSIS — I428 Other cardiomyopathies: Secondary | ICD-10-CM

## 2013-08-09 DIAGNOSIS — E876 Hypokalemia: Secondary | ICD-10-CM

## 2013-08-09 DIAGNOSIS — I4892 Unspecified atrial flutter: Secondary | ICD-10-CM

## 2013-08-09 DIAGNOSIS — R490 Dysphonia: Secondary | ICD-10-CM | POA: Diagnosis present

## 2013-08-09 DIAGNOSIS — I700 Atherosclerosis of aorta: Secondary | ICD-10-CM

## 2013-08-09 DIAGNOSIS — T502X5A Adverse effect of carbonic-anhydrase inhibitors, benzothiadiazides and other diuretics, initial encounter: Secondary | ICD-10-CM | POA: Diagnosis present

## 2013-08-09 DIAGNOSIS — N289 Disorder of kidney and ureter, unspecified: Secondary | ICD-10-CM

## 2013-08-09 LAB — URINALYSIS, ROUTINE W REFLEX MICROSCOPIC
Bilirubin Urine: NEGATIVE
Glucose, UA: NEGATIVE mg/dL
Hgb urine dipstick: NEGATIVE
Ketones, ur: NEGATIVE mg/dL
Leukocytes, UA: NEGATIVE
Nitrite: NEGATIVE
Protein, ur: NEGATIVE mg/dL
Specific Gravity, Urine: 1.01 (ref 1.005–1.030)
Urobilinogen, UA: 0.2 mg/dL (ref 0.0–1.0)
pH: 7 (ref 5.0–8.0)

## 2013-08-09 LAB — COMPREHENSIVE METABOLIC PANEL
ALT: 33 U/L (ref 0–35)
AST: 30 U/L (ref 0–37)
Albumin: 3.1 g/dL — ABNORMAL LOW (ref 3.5–5.2)
Alkaline Phosphatase: 96 U/L (ref 39–117)
BUN: 21 mg/dL (ref 6–23)
CO2: 25 mEq/L (ref 19–32)
Calcium: 9 mg/dL (ref 8.4–10.5)
Chloride: 99 mEq/L (ref 96–112)
Creatinine, Ser: 1.04 mg/dL (ref 0.50–1.10)
GFR calc Af Amer: 56 mL/min — ABNORMAL LOW (ref >90)
GFR calc non Af Amer: 48 mL/min — ABNORMAL LOW (ref >90)
Glucose, Bld: 157 mg/dL — ABNORMAL HIGH (ref 70–99)
Potassium: 3.9 mEq/L (ref 3.5–5.1)
Sodium: 136 mEq/L (ref 135–145)
Total Bilirubin: 0.7 mg/dL (ref 0.3–1.2)
Total Protein: 7.6 g/dL (ref 6.0–8.3)

## 2013-08-09 LAB — D-DIMER, QUANTITATIVE: D-Dimer, Quant: 2.91 ug/mL-FEU — ABNORMAL HIGH (ref 0.00–0.48)

## 2013-08-09 LAB — CBC WITH DIFFERENTIAL/PLATELET
Basophils Absolute: 0 10*3/uL (ref 0.0–0.1)
Basophils Relative: 0 % (ref 0–1)
Eosinophils Absolute: 0.1 10*3/uL (ref 0.0–0.7)
Eosinophils Relative: 1 % (ref 0–5)
HCT: 41 % (ref 36.0–46.0)
Hemoglobin: 13.6 g/dL (ref 12.0–15.0)
Lymphocytes Relative: 18 % (ref 12–46)
Lymphs Abs: 1.4 10*3/uL (ref 0.7–4.0)
MCH: 31.3 pg (ref 26.0–34.0)
MCHC: 33.2 g/dL (ref 30.0–36.0)
MCV: 94.5 fL (ref 78.0–100.0)
Monocytes Absolute: 0.6 10*3/uL (ref 0.1–1.0)
Monocytes Relative: 8 % (ref 3–12)
Neutro Abs: 5.7 10*3/uL (ref 1.7–7.7)
Neutrophils Relative %: 73 % (ref 43–77)
Platelets: 367 10*3/uL (ref 150–400)
RBC: 4.34 MIL/uL (ref 3.87–5.11)
RDW: 14.8 % (ref 11.5–15.5)
WBC: 7.9 10*3/uL (ref 4.0–10.5)

## 2013-08-09 LAB — TROPONIN I
Troponin I: <0.3 ng/mL (ref <0.30)
Troponin I: <0.3 ng/mL (ref <0.30)

## 2013-08-09 LAB — PROTIME-INR
INR: 1.1 (ref 0.00–1.49)
Prothrombin Time: 14 seconds (ref 11.6–15.2)

## 2013-08-09 LAB — PRO B NATRIURETIC PEPTIDE: Pro B Natriuretic peptide (BNP): 8761 pg/mL — ABNORMAL HIGH (ref 0–450)

## 2013-08-09 MED ORDER — DIGOXIN 125 MCG PO TABS
0.1250 mg | ORAL_TABLET | Freq: Every day | ORAL | Status: DC
  Administered 2013-08-09 – 2013-08-10 (×2): 0.125 mg via ORAL
  Filled 2013-08-09 (×2): qty 1

## 2013-08-09 MED ORDER — DILTIAZEM HCL ER COATED BEADS 120 MG PO CP24
120.0000 mg | ORAL_CAPSULE | Freq: Every day | ORAL | Status: DC
Start: 2013-08-09 — End: 2013-08-11
  Administered 2013-08-09 – 2013-08-11 (×3): 120 mg via ORAL
  Filled 2013-08-09 (×3): qty 1

## 2013-08-09 MED ORDER — FUROSEMIDE 10 MG/ML IJ SOLN
40.0000 mg | Freq: Three times a day (TID) | INTRAMUSCULAR | Status: DC
  Administered 2013-08-09 – 2013-08-11 (×5): 40 mg via INTRAVENOUS
  Filled 2013-08-09 (×8): qty 4

## 2013-08-09 MED ORDER — ACETAMINOPHEN 650 MG RE SUPP: 650.0000 mg | Freq: Four times a day (QID) | RECTAL | Status: DC | PRN

## 2013-08-09 MED ORDER — METOPROLOL SUCCINATE ER 50 MG PO TB24
50.0000 mg | ORAL_TABLET | Freq: Two times a day (BID) | ORAL | Status: DC
  Administered 2013-08-09 – 2013-08-11 (×5): 50 mg via ORAL
  Filled 2013-08-09 (×6): qty 1

## 2013-08-09 MED ORDER — AMIODARONE HCL 200 MG PO TABS
200.0000 mg | ORAL_TABLET | Freq: Every day | ORAL | Status: DC
  Administered 2013-08-09 – 2013-08-10 (×2): 200 mg via ORAL
  Filled 2013-08-09 (×2): qty 1

## 2013-08-09 MED ORDER — HYDROCODONE-ACETAMINOPHEN 5-325 MG PO TABS: 1.0000 | ORAL_TABLET | ORAL | Status: DC | PRN

## 2013-08-09 MED ORDER — HEPARIN SODIUM (PORCINE) 5000 UNIT/ML IJ SOLN
5000.0000 [IU] | Freq: Three times a day (TID) | INTRAMUSCULAR | Status: DC
  Administered 2013-08-09 – 2013-08-11 (×7): 5000 [IU] via SUBCUTANEOUS
  Filled 2013-08-09 (×9): qty 1

## 2013-08-09 MED ORDER — ONDANSETRON HCL 4 MG/2ML IJ SOLN: 4.0000 mg | Freq: Four times a day (QID) | INTRAMUSCULAR | Status: DC | PRN

## 2013-08-09 MED ORDER — VITAMIN D (ERGOCALCIFEROL) 1.25 MG (50000 UNIT) PO CAPS
50000.0000 [IU] | ORAL_CAPSULE | ORAL | Status: DC
  Administered 2013-08-09: 50000 [IU] via ORAL
  Filled 2013-08-09: qty 1

## 2013-08-09 MED ORDER — ALUM & MAG HYDROXIDE-SIMETH 200-200-20 MG/5ML PO SUSP: 30.0000 mL | Freq: Four times a day (QID) | ORAL | Status: DC | PRN

## 2013-08-09 MED ORDER — FUROSEMIDE 10 MG/ML IJ SOLN
20.0000 mg | Freq: Once | INTRAMUSCULAR | Status: AC
  Administered 2013-08-09: 20 mg via INTRAVENOUS
  Filled 2013-08-09: qty 2

## 2013-08-09 MED ORDER — IOHEXOL 350 MG/ML SOLN
80.0000 mL | Freq: Once | INTRAVENOUS | Status: AC | PRN
  Administered 2013-08-09: 80 mL via INTRAVENOUS

## 2013-08-09 MED ORDER — ACETAMINOPHEN 325 MG PO TABS: 650.0000 mg | ORAL_TABLET | Freq: Four times a day (QID) | ORAL | Status: DC | PRN

## 2013-08-09 MED ORDER — ONDANSETRON HCL 4 MG PO TABS: 4.0000 mg | ORAL_TABLET | Freq: Four times a day (QID) | ORAL | Status: DC | PRN

## 2013-08-09 MED ORDER — POLYETHYLENE GLYCOL 3350 17 G PO PACK
17.0000 g | PACK | Freq: Every day | ORAL | Status: DC
  Administered 2013-08-10 – 2013-08-11 (×2): 17 g via ORAL
  Filled 2013-08-09 (×3): qty 1

## 2013-08-09 MED ORDER — SODIUM CHLORIDE 0.9 % IJ SOLN
3.0000 mL | Freq: Two times a day (BID) | INTRAMUSCULAR | Status: DC
  Administered 2013-08-09 – 2013-08-11 (×5): 3 mL via INTRAVENOUS

## 2013-08-09 NOTE — Progress Notes (Signed)
1500 Pt poor historian . Got scared when ask for history  Emotional support provided Unable to reach family  This time

## 2013-08-09 NOTE — Progress Notes (Signed)
1447 report received from Maralyn Sago, RN ED

## 2013-08-09 NOTE — ED Provider Notes (Signed)
CSN: [id number]     Arrival date & time 08/09/13  0848 History   First MD Initiated Contact with Patient 08/09/13 [phone]     Chief Complaint  Patient presents with  . Chest Pain   (Consider location/radiation/quality/duration/timing/severity/associated sxs/prior Treatment) HPI Comments: Patient with 10 day history of chest pressure, shortness of breath especially with lying flat, cough and congestion. Seen in the ED and by her PCP with increase in her Lasix dose. She cannot sleep because she cannot breathe she lays flat. She has constant chest pressure that worsens with laying down. No chest pain or back pain at this time. History of previous aortic dissection repair with known intramural thrombus. History of atrial fibrillation on amiodarone. Her cough is productive of clear mucus. No fevers. No nausea, vomiting or abdominal pain.  79 y.o. female with atrial fibrillation/flutter, aortic dissection thoracic repair by Dr. Maren Beach with atrial fibrillation, intramural hematoma of thoracic aorta not allowing chronic anticoagulation, chronic combined systolic/diastolic heart failure with ejection fraction of 40-45%   The history is provided by the patient.    Past Medical History  Diagnosis Date  . Afib   . Aortic root aneurysm   . Hypertension   . CHF (congestive heart failure)   . Shortness of breath   . Aortic atherosclerosis 06/03/2013  . Hypokalemia 08/06/2013   Past Surgical History  Procedure Laterality Date  . Bentall procedure N/A 11/11/2012    Procedure: BENTALL PROCEDURE;  Surgeon: Kerin Perna, MD;  Location: [facility] OR;  Service: Open Heart Surgery;  Laterality: N/A;  *[facility] ARREST*   . Intraoperative transesophageal echocardiogram N/A 11/11/2012    Procedure: INTRAOPERATIVE TRANSESOPHAGEAL ECHOCARDIOGRAM;  Surgeon: Kerin Perna, MD;  Location: [facility] OR;  Service: Open Heart Surgery;  Laterality: N/A;  . Ascending aortic aneurysm repair  10/2012   Family History  Problem Relation  Age of Onset  . Heart attack Neg Hx   . Heart disease Neg Hx   . Heart failure Neg Hx   . Hypertension Neg Hx    History  Substance Use Topics  . Smoking status: Never Smoker   . Smokeless tobacco: Never Used  . Alcohol Use: No   OB History   Grav Para Term Preterm Abortions TAB SAB Ect Mult Living                 Review of Systems  Constitutional: Positive for activity change and fatigue. Negative for fever.  HENT: Positive for congestion and rhinorrhea.   Respiratory: Positive for cough, chest tightness and shortness of breath.   Cardiovascular: Positive for chest pain. Negative for palpitations.  Gastrointestinal: Negative for nausea, vomiting and abdominal pain.  Genitourinary: Negative for dysuria, hematuria, vaginal bleeding and vaginal discharge.  Musculoskeletal: Negative for back pain.  Skin: Negative for rash.  Neurological: Negative for dizziness, weakness and headaches.  A complete 10 system review of systems was obtained and all systems are negative except as noted in the HPI and PMH.    Allergies  Review of patient's allergies indicates no known allergies.  Home Medications   No current outpatient prescriptions on file. BP 117/83  Pulse 96  Temp(Src) 98 F (36.7 C) (Oral)  Resp 22  Ht 5' (1.524 m)  Wt 120 lb 13 oz (54.8 kg)  BMI 23.59 kg/m2  SpO2 95% Physical Exam  Constitutional: She is oriented to person, place, and time. She appears well-developed and well-nourished. No distress.  HENT:  Head: Normocephalic and atraumatic.  Mouth/Throat: Oropharynx is  clear and moist. No oropharyngeal exudate.  Eyes: Conjunctivae and EOM are normal. Pupils are equal, round, and reactive to light.  Neck: Normal range of motion. Neck supple.  Cardiovascular: Normal rate.   irregular rhythm  Pulmonary/Chest: Effort normal and breath sounds normal. No respiratory distress.  Abdominal: Soft. There is no tenderness. There is no rebound and no guarding.   Musculoskeletal: Normal range of motion. She exhibits edema. She exhibits no tenderness.  Trace pedal edema  Neurological: She is alert and oriented to person, place, and time. No cranial nerve deficit. She exhibits normal muscle tone. Coordination normal.  Skin: Skin is warm.    ED Course  Procedures (including critical care time) Labs Review Labs Reviewed  COMPREHENSIVE METABOLIC PANEL - Abnormal; Notable for the following:    Glucose, Bld 157 (*)    Albumin 3.1 (*)    GFR calc non Af Amer 48 (*)    GFR calc Af Amer 56 (*)    All other components within normal limits  PRO B NATRIURETIC PEPTIDE - Abnormal; Notable for the following:    Pro B Natriuretic peptide (BNP) 8761.0 (*)    All other components within normal limits  D-DIMER, QUANTITATIVE - Abnormal; Notable for the following:    D-Dimer, Quant 2.91 (*)    All other components within normal limits  CBC WITH DIFFERENTIAL  TROPONIN I  PROTIME-INR  URINALYSIS, ROUTINE W REFLEX MICROSCOPIC  TROPONIN I  TSH   Imaging Review Dg Chest 2 View  08/09/2013   CLINICAL DATA:  Shortness of Breath; left-sided chest pain  EXAM: CHEST  2 VIEW  COMPARISON:  August 01, 2013  FINDINGS: The known thoracic aortic aneurysm appears unchanged. Cardiomegaly is likewise unchanged. Patient is status post median sternotomy.  There is no edema or consolidation. No adenopathy. There are postoperative changes over the right upper hemithorax, stable.  IMPRESSION: No edema or consolidation. Cardiomegaly as well as known thoracic aortic aneurysm appear grossly stable.   Electronically Signed   By: Bretta Bang M.D.   On: 08/09/2013 10:02   Ct Angio Chest Pe W/cm &/or Wo Cm  08/09/2013   CLINICAL DATA:  Chest pressure, cough, shortness of breath and wheezing. History of ascending thoracic aortic dissection repair.  EXAM: CT ANGIOGRAPHY CHEST WITH CONTRAST  TECHNIQUE: Multidetector CT imaging of the chest was performed using the standard protocol  during bolus administration of intravenous contrast. Multiplanar CT image reconstructions including MIPs were obtained to evaluate the vascular anatomy.  CONTRAST:  80mL OMNIPAQUE IOHEXOL 350 MG/ML SOLN  COMPARISON:  05/08/2013 as well as multiple additional prior exams.  FINDINGS: Contrast timing was performed to evaluate the pulmonary arteries which are well opacified. There is no evidence of pulmonary embolism. Stable dilatation of central pulmonary arteries is identified.  Contrast does reflux into the inferior vena cava as well as dilated central hepatic veins. Findings are consistent with right heart failure. On lung windows, there is some mild pulmonary vascular and interstitial prominence without overt airspace edema or pleural effusions.  Stable density of the ascending thoracic aorta related to prior replacement of the aortic root. There is stable aneurysmal disease at the level of the aortic arch and descending thoracic aorta with maximal aortic caliber of approximately 6 cm at the level of the proximal descending thoracic aorta. The study is not timed to evaluate any changes within the lumen of the thoracic aorta other than gross aortic diameter. No mediastinal hemorrhage is identified.  There is stable cardiomegaly. No pericardial  fluid is seen. No masses or enlarged lymph nodes are identified. The bony thorax is unremarkable.  Review of the MIP images confirms the above findings.  IMPRESSION: 1. No evidence of pulmonary embolism. Stable dilated central hepatic arteries. 2. Evidence of chronic right heart failure with pulmonary vascular and interstitial prominence likely reflecting chronic edema. 3. Stable postoperative changes of the thoracic aorta and aneurysmal dilatation of the residual native aorta. With current contrast timing, the lumen of the aorta is not opacified and unable to be evaluated. Gross diameter of the aorta is unchanged.   Electronically Signed   By: Irish Lack M.D.   On:  08/09/2013 11:57    EKG Interpretation    Date/Time:  Saturday August 09 2013 08:55:34 EST Ventricular Rate:  101 PR Interval:    QRS Duration: 90 QT Interval:  388 QTC Calculation: 503 R Axis:   -80 Text Interpretation:  Atrial fibrillation with rapid ventricular response Left axis deviation Low voltage QRS Inferior infarct , age undetermined Possible Anterolateral infarct , age undetermined Abnormal ECG No significant change was found Confirmed by Manus Gunning  MD, Trell Secrist ([id number]) on 08/09/2013 9:16:37 AM            MDM   1. Hypoxia   2. CHF (congestive heart failure)   3. Atrial fibrillation   4. Acute on chronic combined systolic and diastolic congestive heart failure   5. Afib   6. Hypertension   7. Intramural aortic hematoma   8. Aortic aneurysm with dissection   9. Aortic root aneurysm   10. Cardiomyopathy    Ten-day history of difficulty breathing with laying flat associated with PND and orthopnea. Intermittent chest pressure.  EKG with atrial fibrillation, unchanged. Rate controlled.   Concerning for atrial fibrillation and CHF the source of PND and dyspnea.  Discussed with on-call cardiologist Dr. Jens Som who is covering for Dr. Anne Fu. He agrees with increasing Lasix temporarily to 80 mg daily and patient will followup with Dr. Anne Fu next week.  During the ED stay, patient became progressively hypoxic in the low 90s. On ambulation she desaturated to 83%. CT scan shows no PE. Her thoracic aneurysm appears to be stable.  Suspect acute and chronic diastolic heart failure source of patient's PND and hypoxia. She'll require admission for diuresis and oxygen supplementation.  Glynn Octave, MD 08/09/13 [phone]

## 2013-08-09 NOTE — ED Notes (Addendum)
Pt placed on 2 L oxygen. Pt family going home, taking all pt's belongings. Is 94% with oxygen

## 2013-08-09 NOTE — Progress Notes (Signed)
1500 Pt  Received drom ED via stretcher. Awake , alert and oriented. Kept comfortable in bed . Safety precautions observed . Orientation to unit done

## 2013-08-09 NOTE — ED Notes (Signed)
Daughter stated, she has been having chest pressure , she was told she has fluid around her heart.

## 2013-08-09 NOTE — Progress Notes (Signed)
1830  Telephone call received from daughter . Updated about over  pt's l status . Let daughter to speak with pt. Appreciated update

## 2013-08-09 NOTE — H&P (Signed)
[facility] History and Physical  [name] ZOX:[id number] DOB: 1927/12/18 DOA: 08/09/2013  Referring physician: Glynn Octave PCP: Emeterio Reeve, MD   Chief Complaint: Shortness of breath  HPI: [name] is a 79 y.o. female with past medical history of aortic root aneurysm status post repair, combined CHF and atrial fibrillation. Patient came into the hospital because of shortness of breath. For the past 10 days patient has some chest pressure, shortness of breath especially when laying flat, cough and congestion. Patient seen by her cardiologist on the 24th in her Lasix dose was increased, patient continues to have same problem so she presented to the emergency department today. In the ED cardiology was called and they deferred the admission to try hospitalists.. The physician patient developed hypoxia with ambulation, and because her symptoms requests admission for acute on chronic combined CHF.  Review of Systems:  Constitutional: negative for anorexia, fevers and sweats, she has soft and muffled voice Eyes: negative for irritation, redness and visual disturbance Ears, nose, mouth, throat, and face: negative for earaches, epistaxis, nasal congestion and sore throat Respiratory: negative for cough, dyspnea on exertion, sputum and wheezing Cardiovascular: negative for chest pain, dyspnea, lower extremity edema, orthopnea, palpitations and syncope Gastrointestinal: negative for abdominal pain, constipation, diarrhea, melena, nausea and vomiting Genitourinary:negative for dysuria, frequency and hematuria Hematologic/lymphatic: negative for bleeding, easy bruising and lymphadenopathy Musculoskeletal:negative for arthralgias, muscle weakness and stiff joints Neurological: negative for coordination problems, gait problems, headaches and weakness Endocrine: negative for diabetic symptoms including polydipsia, polyuria and weight loss Allergic/Immunologic: negative for anaphylaxis,  hay fever and urticaria   Past Medical History  Diagnosis Date  . Afib   . Aortic root aneurysm   . Hypertension   . CHF (congestive heart failure)   . Shortness of breath   . Aortic atherosclerosis 06/03/2013  . Hypokalemia 08/06/2013   Past Surgical History  Procedure Laterality Date  . Bentall procedure N/A 11/11/2012    Procedure: BENTALL PROCEDURE;  Surgeon: Kerin Perna, MD;  Location: [facility] OR;  Service: Open Heart Surgery;  Laterality: N/A;  *C[facility] ARREST*   . Intraoperative transesophageal echocardiogram N/A 11/11/2012    Procedure: INTRAOPERATIVE TRANSESOPHAGEAL ECHOCARDIOGRAM;  Surgeon: Kerin Perna, MD;  Location: [facility] OR;  Service: Open Heart Surgery;  Laterality: N/A;  . Ascending aortic aneurysm repair  10/2012   Social History:  reports that she has never smoked. She has never used smokeless tobacco. She reports that she does not drink alcohol or use illicit drugs.  No Known Allergies  Family History  Problem Relation Age of Onset  . Heart attack Neg Hx   . Heart disease Neg Hx   . Heart failure Neg Hx   . Hypertension Neg Hx      Prior to Admission medications   Medication Sig Start Date End Date Taking? Authorizing Provider  amiodarone (PACERONE) 200 MG tablet Take 200 mg by mouth daily.   Yes Historical Provider, MD  digoxin (LANOXIN) 0.125 MG tablet Take 0.125 mg by mouth daily.   Yes Historical Provider, MD  diltiazem (CARDIZEM CD) 120 MG 24 hr capsule Take 120 mg by mouth daily.   Yes Historical Provider, MD  furosemide (LASIX) 40 MG tablet Take 1 tablet (40 mg total) by mouth 2 (two) times daily. 08/08/13  Yes Donato Schultz, MD  lisinopril (PRINIVIL,ZESTRIL) 20 MG tablet Take 20 mg by mouth daily.   Yes Historical Provider, MD  metoprolol succinate (TOPROL-XL) 50 MG 24 hr tablet Take 50 mg by mouth  2 (two) times daily. Take with or immediately following a meal. 06/11/13  Yes Donato Schultz, MD  potassium chloride 20 MEQ TBCR Take 40 mEq by mouth 2 (two) times  daily. 08/06/13  Yes Donato Schultz, MD  Vitamin D, Ergocalciferol, (DRISDOL) 50000 UNITS CAPS capsule Take 50,000 Units by mouth every 7 (seven) days.   Yes Historical Provider, MD   Physical Exam: Filed Vitals:   08/09/13 1300  BP: 126/85  Pulse: 99  Temp:   Resp: 0    BP 126/85  Pulse 99  Temp(Src) 97.7 F (36.5 C) (Oral)  Resp 0  Wt 56.7 kg (125 lb)  SpO2 98% General appearance: alert, cooperative and no distress  Head: Normocephalic, without obvious abnormality, atraumatic  Eyes: conjunctivae/corneas clear. PERRL, EOM's intact. Fundi benign.  Nose: Nares normal. Septum midline. Mucosa normal. No drainage or sinus tenderness.  Throat: lips, mucosa, and tongue normal; teeth and gums normal  Neck: Supple, no masses, no cervical lymphadenopathy, no JVD appreciated, no meningeal signs Resp: Wet crackles bilaterally Chest wall: no tenderness  Cardio: regular rate and rhythm, S1, S2 normal, no murmur, click, rub or gallop  GI: soft, non-tender; bowel sounds normal; no masses, no organomegaly  Extremities: No evidence of significant edema bilaterally Skin: Skin color, texture, turgor normal. No rashes or lesions  Neurologic: Alert and oriented X 3, normal strength and tone. Normal symmetric reflexes. Normal coordination and gait  Labs on Admission:  Basic Metabolic Panel:  Recent Labs Lab 08/06/13 1149 08/09/13 0930  NA 140 136  K 2.5* 3.9  CL 99 99  CO2 31 25  GLUCOSE 110* 157*  BUN 24* 21  CREATININE 1.3* 1.04  CALCIUM 8.8 9.0   Liver Function Tests:  Recent Labs Lab 08/09/13 0930  AST 30  ALT 33  ALKPHOS 96  BILITOT 0.7  PROT 7.6  ALBUMIN 3.1*   No results found for this basename: LIPASE, AMYLASE,  in the last 168 hours No results found for this basename: AMMONIA,  in the last 168 hours CBC:  Recent Labs Lab 08/09/13 0930  WBC 7.9  NEUTROABS 5.7  HGB 13.6  HCT 41.0  MCV 94.5  PLT 367   Cardiac Enzymes:  Recent Labs Lab 08/09/13 0930  08/09/13 1211  TROPONINI <0.30 <0.30    BNP (last 3 results)  Recent Labs  03/06/13 0415 08/01/13 1430 08/09/13 0930  PROBNP 5114.0* 9024.0* 8761.0*   CBG: No results found for this basename: GLUCAP,  in the last 168 hours  Radiological Exams on Admission: Dg Chest 2 View  08/09/2013   CLINICAL DATA:  Shortness of Breath; left-sided chest pain  EXAM: CHEST  2 VIEW  COMPARISON:  August 01, 2013  FINDINGS: The known thoracic aortic aneurysm appears unchanged. Cardiomegaly is likewise unchanged. Patient is status post median sternotomy.  There is no edema or consolidation. No adenopathy. There are postoperative changes over the right upper hemithorax, stable.  IMPRESSION: No edema or consolidation. Cardiomegaly as well as known thoracic aortic aneurysm appear grossly stable.   Electronically Signed   By: Bretta Bang M.D.   On: 08/09/2013 10:02   Ct Angio Chest Pe W/cm &/or Wo Cm  08/09/2013   CLINICAL DATA:  Chest pressure, cough, shortness of breath and wheezing. History of ascending thoracic aortic dissection repair.  EXAM: CT ANGIOGRAPHY CHEST WITH CONTRAST  TECHNIQUE: Multidetector CT imaging of the chest was performed using the standard protocol during bolus administration of intravenous contrast. Multiplanar CT image reconstructions including MIPs were  obtained to evaluate the vascular anatomy.  CONTRAST:  80mL OMNIPAQUE IOHEXOL 350 MG/ML SOLN  COMPARISON:  05/08/2013 as well as multiple additional prior exams.  FINDINGS: Contrast timing was performed to evaluate the pulmonary arteries which are well opacified. There is no evidence of pulmonary embolism. Stable dilatation of central pulmonary arteries is identified.  Contrast does reflux into the inferior vena cava as well as dilated central hepatic veins. Findings are consistent with right heart failure. On lung windows, there is some mild pulmonary vascular and interstitial prominence without overt airspace edema or pleural  effusions.  Stable density of the ascending thoracic aorta related to prior replacement of the aortic root. There is stable aneurysmal disease at the level of the aortic arch and descending thoracic aorta with maximal aortic caliber of approximately 6 cm at the level of the proximal descending thoracic aorta. The study is not timed to evaluate any changes within the lumen of the thoracic aorta other than gross aortic diameter. No mediastinal hemorrhage is identified.  There is stable cardiomegaly. No pericardial fluid is seen. No masses or enlarged lymph nodes are identified. The bony thorax is unremarkable.  Review of the MIP images confirms the above findings.  IMPRESSION: 1. No evidence of pulmonary embolism. Stable dilated central hepatic arteries. 2. Evidence of chronic right heart failure with pulmonary vascular and interstitial prominence likely reflecting chronic edema. 3. Stable postoperative changes of the thoracic aorta and aneurysmal dilatation of the residual native aorta. With current contrast timing, the lumen of the aorta is not opacified and unable to be evaluated. Gross diameter of the aorta is unchanged.   Electronically Signed   By: Irish Lack M.D.   On: 08/09/2013 11:57    EKG: Independently reviewed.  Assessment/Plan Principal Problem:   Acute on chronic combined systolic and diastolic congestive heart failure Active Problems:   Afib   Hypertension   Intramural aortic hematoma   Hypoxia    Acute on chronic combined CHF -Patient has history of LVEF of 43% and grade 2 diastolic dysfunction. -Came in with symptoms suggesting acute CHF exacerbation. -Patient started on Lasix 40 mg 3 times a day, check her weight daily, heart healthy diet. -Continue metoprolol and digoxin, because of aggressive diuresis, hold lisinopril but restart prior to discharge.  Atrial fibrillation -Patient rate is controlled with Toprol-XL, amiodarone, digoxin and Cardizem. -No anticoagulation  because of history of intramural thrombus.   History of aortic root aneurysm -Status post repair done by Dr. Morton Peters on March of this year. -She has evidence of intramural thrombus, no anticoagulation is recommended.  Hoarseness of voice -Patient said she is weak and hoarse voice since surgery in March. -? 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   Code Status: Full code Family Communication: Plan discussed with the patient Disposition Plan: Inpatient, telemetry, and spit length of stay to be greater than 2 midnights  Time spent: 70 minutes  [facility] A [facility] Pager [phone]

## 2013-08-09 NOTE — ED Notes (Signed)
Pt undressed, in gown, on monitor, continuous pulse oximetry and blood pressure cuff; family at bedside 

## 2013-08-10 DIAGNOSIS — E876 Hypokalemia: Secondary | ICD-10-CM

## 2013-08-10 DIAGNOSIS — N183 Chronic kidney disease, stage 3 unspecified: Secondary | ICD-10-CM

## 2013-08-10 DIAGNOSIS — I4892 Unspecified atrial flutter: Secondary | ICD-10-CM

## 2013-08-10 DIAGNOSIS — I5043 Acute on chronic combined systolic (congestive) and diastolic (congestive) heart failure: Principal | ICD-10-CM

## 2013-08-10 LAB — BASIC METABOLIC PANEL
BUN: 21 mg/dL (ref 6–23)
CO2: 26 mEq/L (ref 19–32)
Calcium: 9.2 mg/dL (ref 8.4–10.5)
Chloride: 99 mEq/L (ref 96–112)
Creatinine, Ser: 1.26 mg/dL — ABNORMAL HIGH (ref 0.50–1.10)
GFR calc Af Amer: 44 mL/min — ABNORMAL LOW (ref >90)
GFR calc non Af Amer: 38 mL/min — ABNORMAL LOW (ref >90)
Glucose, Bld: 97 mg/dL (ref 70–99)
Potassium: 3.5 mEq/L (ref 3.5–5.1)
Sodium: 139 mEq/L (ref 135–145)

## 2013-08-10 LAB — TSH: TSH: 1.969 u[IU]/mL (ref 0.350–4.500)

## 2013-08-10 LAB — CBC
HCT: 41.6 % (ref 36.0–46.0)
Hemoglobin: 13.7 g/dL (ref 12.0–15.0)
MCH: 31.1 pg (ref 26.0–34.0)
MCHC: 32.9 g/dL (ref 30.0–36.0)
MCV: 94.5 fL (ref 78.0–100.0)
Platelets: 403 10*3/uL — ABNORMAL HIGH (ref 150–400)
RBC: 4.4 MIL/uL (ref 3.87–5.11)
RDW: 14.9 % (ref 11.5–15.5)
WBC: 6.4 10*3/uL (ref 4.0–10.5)

## 2013-08-10 LAB — DIGOXIN LEVEL: Digoxin Level: 0.7 ng/mL — ABNORMAL LOW (ref 0.8–2.0)

## 2013-08-10 MED ORDER — DIGOXIN 125 MCG PO TABS
0.1250 mg | ORAL_TABLET | Freq: Every day | ORAL | Status: DC
  Administered 2013-08-11: 0.125 mg via ORAL
  Filled 2013-08-10 (×2): qty 1

## 2013-08-10 NOTE — Evaluation (Signed)
Occupational Therapy Evaluation Patient Details Name: [name] MRN: [id number] DOB: 1928-01-29 Today's Date: 08/10/2013 Time: 4098-1191 OT Time Calculation (min): 10 min  OT Assessment / Plan / Recommendation History of present illness [name] is a 79 y.o. female with past medical history of aortic root aneurysm status post repair, combined CHF and atrial fibrillation. Patient came into the hospital because of shortness of breath. For the past 10 days patient has some chest pressure, shortness of breath especially when laying flat, cough and congestion. Patient seen by her cardiologist on the 24th in her Lasix dose was increased, patient continues to have same problem so she presented to the emergency department today.   Clinical Impression   Pt admitted with above. Pt is at baseline level of function with ADLs and functional mobility. No further acute OT services needed. Will sign off.   OT Assessment  Patient does not need any further OT services    Follow Up Recommendations  No OT follow up;Supervision - Intermittent    Barriers to Discharge      Equipment Recommendations  None recommended by OT    Recommendations for Other Services    Frequency       Precautions / Restrictions Precautions Precautions: None   Pertinent Vitals/Pain See vitals    ADL  Grooming: Performed;Brushing hair;Modified independent Where Assessed - Grooming: Unsupported standing Lower Body Dressing: Performed;Modified independent Where Assessed - Lower Body Dressing: Unsupported sitting (doffed socks) Toilet Transfer: Simulated;Modified independent Toilet Transfer Method: Sit to Barista:  (chair) Transfers/Ambulation Related to ADLs: mod I ambulating in room ADL Comments: Pt able to ambulate to sink and retrieve comb for grooming task.  Pt is at baseline.    OT Diagnosis:    OT Problem List:   OT Treatment Interventions:     OT Goals(Current goals can be found in  the care plan section) Acute Rehab OT Goals Patient Stated Goal: Home  Visit Information  Last OT Received On: 08/10/13 Assistance Needed: +1 History of Present Illness: [name] is a 79 y.o. female with past medical history of aortic root aneurysm status post repair, combined CHF and atrial fibrillation. Patient came into the hospital because of shortness of breath. For the past 10 days patient has some chest pressure, shortness of breath especially when laying flat, cough and congestion. Patient seen by her cardiologist on the 24th in her Lasix dose was increased, patient continues to have same problem so she presented to the emergency department today.       Prior Functioning     Home Living Family/patient expects to be discharged to:: Private residence Living Arrangements: Children Available Help at Discharge: Family Type of Home: Apartment Home Access: Stairs to enter Secretary/administrator of Steps: flight Entrance Stairs-Rails: Left Home Layout: One level Home Equipment: None Prior Function Level of Independence: Independent Comments: States her daughter often provides setup assist by retrieving clothes for pt at home. Communication Communication: Prefers language other than English ([place])         Vision/Perception     Cognition  Cognition Arousal/Alertness: Awake/alert Behavior During Therapy: WFL for tasks assessed/performed Overall Cognitive Status: Within Functional Limits for tasks assessed    Extremity/Trunk Assessment Upper Extremity Assessment Upper Extremity Assessment: Overall WFL for tasks assessed Lower Extremity Assessment Lower Extremity Assessment: Overall WFL for tasks assessed     Mobility Bed Mobility Bed Mobility: Not assessed Supine to Sit: 7: Independent Transfers Transfers: Sit to Stand;Stand to Sit Sit to Stand: 7:  Independent;From chair/3-in-1 Stand to Sit: 7: Independent;To chair/3-in-1     Exercise     Balance      End of Session OT - End of Session Activity Tolerance: Patient tolerated treatment well Patient left: in chair;with call bell/phone within reach Nurse Communication: Mobility status  GO    08/10/2013 Cipriano Mile OTR/L Pager [phone] Office [phone]  Cipriano Mile 08/10/2013, 2:19 PM

## 2013-08-10 NOTE — Progress Notes (Signed)
[facility] PROGRESS NOTE    [name] ZOX:[id number] DOB: [birth year] DOA: 08/09/2013 PCP: Emeterio Reeve, MD  HPI/Brief narrative 79 year old female with history of persistent atrial flutter, prior aortic root repair, no anticoagulation secondary to previous expanding aortic hematoma, intermittent hoarseness of voice, chronic combined systolic/diastolic heart failure, secondary pulmonary hypertension presented with worsening dyspnea, chest pressure and was admitted for evaluation and management of acute on chronic combined CHF.  Assessment/Plan:  Acute on chronic combined CHF  -Patient has history of LVEF of 43% and grade 2 diastolic dysfunction.  -Came in with symptoms suggesting acute CHF exacerbation. BNP was increased on admission. -Patient started on Lasix 40 mg 3 times a day, check her weight daily, heart healthy diet.  -Continue metoprolol and digoxin, because of aggressive diuresis, hold lisinopril but restart prior to discharge - Cardiology consultation appreciated. Continue additional days of IV Lasix   Atrial fibrillation  -Patient rate is controlled with Toprol-XL, amiodarone, digoxin and Cardizem.  -No anticoagulation because of history of aortic hematoma -Cardiology consultation appreciated: Have discontinued the amiodarone as a rhythm control strategy does not appear to be plausible. Cardiology also does not recommend performing cardioversion because of inability to anticoagulate. Check serum digoxin levels   History of aortic root aneurysm  -Status post repair done by Dr. Morton Peters on March of this year.  -She has evidence of intramural thrombus, no anticoagulation is recommended.   Hoarseness of voice  -Patient said she is weak and hoarse voice since surgery in March.  -? 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   Stage III chronic kidney disease - Monitor creatinine closely while on diuresis.  Hypokalemia - Resolved    Code  Status: Full  Family Communication: None at bedside  Disposition Plan: Home when medically stable   Consultants:  Cardiology  Procedures:  None  Antibiotics:  None   Subjective: Poor historian. Denies dyspnea. Gives 3 day history of hoarseness. Denies chest pressure or pain. Overall feels better.   Objective: Filed Vitals:   08/10/13 0540 08/10/13 0941 08/10/13 0950 08/10/13 1445  BP: 105/63  104/80 88/66  Pulse: 91 95 92 81  Temp: 98.1 F (36.7 C)   97.8 F (36.6 C)  TempSrc: Oral   Axillary  Resp: 17   16  Height:      Weight: 53.9 kg (118 lb 13.3 oz)     SpO2: 91%   92%    Intake/Output Summary (Last 24 hours) at 08/10/13 1523 Last data filed at 08/10/13 1331  Gross per 24 hour  Intake    860 ml  Output    725 ml  Net    135 ml   Filed Weights   08/09/13 0902 08/09/13 1528 08/10/13 0540  Weight: 56.7 kg (125 lb) 54.8 kg (120 lb 13 oz) 53.9 kg (118 lb 13.3 oz)     Exam:  General exam: Elderly female comfortably ambulating in the hallway with PT  Respiratory system: Clear. No increased work of breathing. Cardiovascular system: S1 & S2 heard,  irregularly irregular. No JVD, murmurs, gallops, clicks or pedal edema.Telemetry: A. fib in the 80s-90s  Gastrointestinal system: Abdomen is nondistended, soft and nontender. Normal bowel sounds heard. Central nervous system: Alert and oriented. No focal neurological deficits. Extremities: Symmetric 5 x 5 power.   Data Reviewed: Basic Metabolic Panel:  Recent Labs Lab 08/06/13 1149 08/09/13 0930 08/10/13 0539  NA 140 136 139  K 2.5* 3.9 3.5  CL 99 99 99  CO2 31  25 26  GLUCOSE 110* 157* 97  BUN 24* 21 21  CREATININE 1.3* 1.04 1.26*  CALCIUM 8.8 9.0 9.2   Liver Function Tests:  Recent Labs Lab 08/09/13 0930  AST 30  ALT 33  ALKPHOS 96  BILITOT 0.7  PROT 7.6  ALBUMIN 3.1*   No results found for this basename: LIPASE, AMYLASE,  in the last 168 hours No results found for this basename: AMMONIA,   in the last 168 hours CBC:  Recent Labs Lab 08/09/13 0930 08/10/13 0539  WBC 7.9 6.4  NEUTROABS 5.7  --   HGB 13.6 13.7  HCT 41.0 41.6  MCV 94.5 94.5  PLT 367 403*   Cardiac Enzymes:  Recent Labs Lab 08/09/13 0930 08/09/13 1211  TROPONINI <0.30 <0.30   BNP (last 3 results)  Recent Labs  03/06/13 0415 08/01/13 1430 08/09/13 0930  PROBNP 5114.0* 9024.0* 8761.0*   CBG: No results found for this basename: GLUCAP,  in the last 168 hours  No results found for this or any previous visit (from the past 240 hour(s)).     Studies: Dg Chest 2 View  08/09/2013   CLINICAL DATA:  Shortness of Breath; left-sided chest pain  EXAM: CHEST  2 VIEW  COMPARISON:  August 01, 2013  FINDINGS: The known thoracic aortic aneurysm appears unchanged. Cardiomegaly is likewise unchanged. Patient is status post median sternotomy.  There is no edema or consolidation. No adenopathy. There are postoperative changes over the right upper hemithorax, stable.  IMPRESSION: No edema or consolidation. Cardiomegaly as well as known thoracic aortic aneurysm appear grossly stable.   Electronically Signed   By: Bretta Bang M.D.   On: 08/09/2013 10:02   Ct Angio Chest Pe W/cm &/or Wo Cm  08/09/2013   CLINICAL DATA:  Chest pressure, cough, shortness of breath and wheezing. History of ascending thoracic aortic dissection repair.  EXAM: CT ANGIOGRAPHY CHEST WITH CONTRAST  TECHNIQUE: Multidetector CT imaging of the chest was performed using the standard protocol during bolus administration of intravenous contrast. Multiplanar CT image reconstructions including MIPs were obtained to evaluate the vascular anatomy.  CONTRAST:  80mL OMNIPAQUE IOHEXOL 350 MG/ML SOLN  COMPARISON:  05/08/2013 as well as multiple additional prior exams.  FINDINGS: Contrast timing was performed to evaluate the pulmonary arteries which are well opacified. There is no evidence of pulmonary embolism. Stable dilatation of central pulmonary  arteries is identified.  Contrast does reflux into the inferior vena cava as well as dilated central hepatic veins. Findings are consistent with right heart failure. On lung windows, there is some mild pulmonary vascular and interstitial prominence without overt airspace edema or pleural effusions.  Stable density of the ascending thoracic aorta related to prior replacement of the aortic root. There is stable aneurysmal disease at the level of the aortic arch and descending thoracic aorta with maximal aortic caliber of approximately 6 cm at the level of the proximal descending thoracic aorta. The study is not timed to evaluate any changes within the lumen of the thoracic aorta other than gross aortic diameter. No mediastinal hemorrhage is identified.  There is stable cardiomegaly. No pericardial fluid is seen. No masses or enlarged lymph nodes are identified. The bony thorax is unremarkable.  Review of the MIP images confirms the above findings.  IMPRESSION: 1. No evidence of pulmonary embolism. Stable dilated central hepatic arteries. 2. Evidence of chronic right heart failure with pulmonary vascular and interstitial prominence likely reflecting chronic edema. 3. Stable postoperative changes of the thoracic aorta  and aneurysmal dilatation of the residual native aorta. With current contrast timing, the lumen of the aorta is not opacified and unable to be evaluated. Gross diameter of the aorta is unchanged.   Electronically Signed   By: Irish Lack M.D.   On: 08/09/2013 11:57        Scheduled Meds: . digoxin  0.125 mg Oral Daily  . diltiazem  120 mg Oral Daily  . furosemide  40 mg Intravenous Q8H  . heparin  5,000 Units Subcutaneous Q8H  . metoprolol succinate  50 mg Oral BID  . polyethylene glycol  17 g Oral Daily  . sodium chloride  3 mL Intravenous Q12H  . Vitamin D (Ergocalciferol)  50,000 Units Oral Q7 days   Continuous Infusions:   Principal Problem:   Acute on chronic combined systolic  and diastolic congestive heart failure Active Problems:   Afib   Hypertension   Intramural aortic hematoma   Hypoxia    Time spent: 35 minutes    Percy Comp, MD, FACP, FHM. [facility] Pager [phone]  If 7PM-7AM, please contact night-coverage [email or web address] Password [email or web address] 08/10/2013, 3:23 PM    LOS: 1 day

## 2013-08-10 NOTE — Consult Note (Signed)
Admit date: 08/09/2013 Referring Physician  Dr. Rondell Reams Primary Physician Emeterio Reeve, MD Primary Cardiologist  Dr. Anne Fu Reason for Consultation  Heart failure  HPI: 79 year old with persistent atrial flutter, improved rate control, prior aortic root repair /Bentall procedure followed by Dr. Maren Beach, with no anticoagulation secondary to previous expanding aortic hematoma who presented with 2 to three-day history of increasing orthopnea, shortness of breath. She states that she could not sleep.ejection fraction previously 40-45%.  I recently saw her in clinic a few days ago and decided to increase her Lasix because of this intermittent complaint of trouble sleeping, trouble breathing. When I checked her blood work, her potassium was quite low at 2.5 and we aggressively repleted and today she is within a normal range.  Over the past few months since her surgery, she has had this complaint of trouble sleeping, shortness of breath intermittently. At times, I thought this may be related to her atrial flutter with rapid ventricular response. Because of this, I have more aggressively controlled her heart rate and she is currently in the 90s on digoxin, diltiazem, metoprolol, amiodarone combination. Because of the persistent nature of her atrial flutter, I may consider discontinuation of amiodarone.  At prior office visit with Dr. Maren Beach, hypotension was noted in many of her medications were withdrawn but now they are back on board.  I cannot perform cardioversion safely because of no anticoagulation secondary to intramural hematoma of aorta.  Currently, she is laying comfortably in bed, does not appear to be in any respiratory distress.  She also has long-standing complaint of hoarseness. This was thought to be secondary to recurrent laryngeal nerve palsy following surgery.    PMH:   Past Medical History  Diagnosis Date  . Afib   . Aortic root aneurysm   . Hypertension   . CHF  (congestive heart failure)   . Shortness of breath   . Aortic atherosclerosis 06/03/2013  . Hypokalemia 08/06/2013    PSH:   Past Surgical History  Procedure Laterality Date  . Bentall procedure N/A 11/11/2012    Procedure: BENTALL PROCEDURE;  Surgeon: Kerin Perna, MD;  Location: [facility] OR;  Service: Open Heart Surgery;  Laterality: N/A;  *CIRC ARREST*   . Intraoperative transesophageal echocardiogram N/A 11/11/2012    Procedure: INTRAOPERATIVE TRANSESOPHAGEAL ECHOCARDIOGRAM;  Surgeon: Kerin Perna, MD;  Location: [facility] OR;  Service: Open Heart Surgery;  Laterality: N/A;  . Ascending aortic aneurysm repair  10/2012   Allergies:  Review of patient's allergies indicates no known allergies. Prior to Admit Meds:   Prior to Admission medications   Medication Sig Start Date End Date Taking? Authorizing Provider  amiodarone (PACERONE) 200 MG tablet Take 200 mg by mouth daily.   Yes Historical Provider, MD  digoxin (LANOXIN) 0.125 MG tablet Take 0.125 mg by mouth daily.   Yes Historical Provider, MD  diltiazem (CARDIZEM CD) 120 MG 24 hr capsule Take 120 mg by mouth daily.   Yes Historical Provider, MD  furosemide (LASIX) 40 MG tablet Take 1 tablet (40 mg total) by mouth 2 (two) times daily. 08/08/13  Yes Donato Schultz, MD  lisinopril (PRINIVIL,ZESTRIL) 20 MG tablet Take 20 mg by mouth daily.   Yes Historical Provider, MD  metoprolol succinate (TOPROL-XL) 50 MG 24 hr tablet Take 50 mg by mouth 2 (two) times daily. Take with or immediately following a meal. 06/11/13  Yes Donato Schultz, MD  potassium chloride 20 MEQ TBCR Take 40 mEq by mouth 2 (two) times daily. 08/06/13  Yes Donato Schultz, MD  Vitamin D, Ergocalciferol, (DRISDOL) 50000 UNITS CAPS capsule Take 50,000 Units by mouth every 7 (seven) days.   Yes Historical Provider, MD   Fam HX:    Family History  Problem Relation Age of Onset  . Heart attack Neg Hx   . Heart disease Neg Hx   . Heart failure Neg Hx   . Hypertension Neg Hx    Social HX:     History   Social History  . Marital Status: Widowed    Spouse Name: N/A    Number of Children: 2  . Years of Education: N/A   Occupational History  . Not on file.   Social History Main Topics  . Smoking status: Never Smoker   . Smokeless tobacco: Never Used  . Alcohol Use: No  . Drug Use: No  . Sexual Activity: No   Other Topics Concern  . Not on file   Social History Narrative   From [place], [place]     ROS:  General malaise, intermittent, fatigue, trouble sleeping, shortness of breath, no bleeding, no chest pain All 11 ROS were addressed and are negative except what is stated in the HPI   Physical Exam: Blood pressure 104/80, pulse 95, temperature 98.1 F (36.7 C), temperature source Oral, resp. rate 17, height 5' (1.524 m), weight 118 lb 13.3 oz (53.9 kg), SpO2 91.00%.   General: Well developed, well nourished,elderly in no acute distress Head: Eyes PERRLA, No xanthomas.   Normal cephalic and atramatic  Lungs:   Clear bilaterally to auscultation and percussion. Normal respiratory effort. No wheezes, no rales. Heart:   Irregularly irregular, normal rate Pulses are 2+ & equal. Soft systolic murmur, rubs, gallops.  No carotid bruit. No JVD.  No abdominal bruits.  Abdomen: Bowel sounds are positive, abdomen soft and non-tender without masses. No hepatosplenomegaly. Msk:  Back normal. Normal strength and tone for age. Extremities:  No clubbing, cyanosis or edema.  DP +1 Neuro: Alert and oriented X 3, non-focal, MAE x 4 GU: Deferred Rectal: Deferred Psych:  Good affect, responds appropriately      Labs: Lab Results  Component Value Date   WBC 6.4 08/10/2013   HGB 13.7 08/10/2013   HCT 41.6 08/10/2013   MCV 94.5 08/10/2013   PLT 403* 08/10/2013     Recent Labs Lab 08/09/13 0930 08/10/13 0539  NA 136 139  K 3.9 3.5  CL 99 99  CO2 25 26  BUN 21 21  CREATININE 1.04 1.26*  CALCIUM 9.0 9.2  PROT 7.6  --   BILITOT 0.7  --   ALKPHOS 96  --   ALT 33  --     AST 30  --   GLUCOSE 157* 97    Recent Labs  08/09/13 0930 08/09/13 1211  TROPONINI <0.30 <0.30   No results found for this basename: CHOL, HDL, LDLCALC, TRIG   Lab Results  Component Value Date   DDIMER 2.91* 08/09/2013     Radiology:  Dg Chest 2 View  08/09/2013   CLINICAL DATA:  Shortness of Breath; left-sided chest pain  EXAM: CHEST  2 VIEW  COMPARISON:  August 01, 2013  FINDINGS: The known thoracic aortic aneurysm appears unchanged. Cardiomegaly is likewise unchanged. Patient is status post median sternotomy.  There is no edema or consolidation. No adenopathy. There are postoperative changes over the right upper hemithorax, stable.  IMPRESSION: No edema or consolidation. Cardiomegaly as well as known thoracic aortic aneurysm appear grossly stable.  Electronically Signed   By: Bretta Bang M.D.   On: 08/09/2013 10:02   Ct Angio Chest Pe W/cm &/or Wo Cm  08/09/2013   CLINICAL DATA:  Chest pressure, cough, shortness of breath and wheezing. History of ascending thoracic aortic dissection repair.  EXAM: CT ANGIOGRAPHY CHEST WITH CONTRAST  TECHNIQUE: Multidetector CT imaging of the chest was performed using the standard protocol during bolus administration of intravenous contrast. Multiplanar CT image reconstructions including MIPs were obtained to evaluate the vascular anatomy.  CONTRAST:  80mL OMNIPAQUE IOHEXOL 350 MG/ML SOLN  COMPARISON:  05/08/2013 as well as multiple additional prior exams.  FINDINGS: Contrast timing was performed to evaluate the pulmonary arteries which are well opacified. There is no evidence of pulmonary embolism. Stable dilatation of central pulmonary arteries is identified.  Contrast does reflux into the inferior vena cava as well as dilated central hepatic veins. Findings are consistent with right heart failure. On lung windows, there is some mild pulmonary vascular and interstitial prominence without overt airspace edema or pleural effusions.  Stable  density of the ascending thoracic aorta related to prior replacement of the aortic root. There is stable aneurysmal disease at the level of the aortic arch and descending thoracic aorta with maximal aortic caliber of approximately 6 cm at the level of the proximal descending thoracic aorta. The study is not timed to evaluate any changes within the lumen of the thoracic aorta other than gross aortic diameter. No mediastinal hemorrhage is identified.  There is stable cardiomegaly. No pericardial fluid is seen. No masses or enlarged lymph nodes are identified. The bony thorax is unremarkable.  Review of the MIP images confirms the above findings.  IMPRESSION: 1. No evidence of pulmonary embolism. Stable dilated central hepatic arteries. 2. Evidence of chronic right heart failure with pulmonary vascular and interstitial prominence likely reflecting chronic edema. 3. Stable postoperative changes of the thoracic aorta and aneurysmal dilatation of the residual native aorta. With current contrast timing, the lumen of the aorta is not opacified and unable to be evaluated. Gross diameter of the aorta is unchanged.   Electronically Signed   By: Irish Lack M.D.   On: 08/09/2013 11:57   Personally viewed.  EKG:  Persistent atrial flutter, heart rate in the 90s. No change from prior. Personally viewed.   Echocardiogram 05/09/13:  - Left ventricle: The cavity size was normal. Wall thickness was normal. Systolic function was mildly to moderately reduced. The estimated ejection fraction was in the range of 40% to 45%. Diffuse hypokinesis. There was septal-lateral dyssynchrony. Features are consistent with a pseudonormal left ventricular filling pattern, with concomitant abnormal relaxation and increased filling pressure (grade 2 diastolic dysfunction). - Aortic valve: There was no stenosis. Mild regurgitation. - Mitral valve: Mild regurgitation. - Left atrium: The atrium was moderately dilated. - Right  ventricle: The cavity size was normal. Systolic function was mildly reduced. - Right atrium: The atrium was moderately dilated. - Tricuspid valve: Peak RV-RA gradient:64mm Hg (S). - Pulmonary arteries: PA peak pressure: 38mm Hg (S). - Systemic veins: IVC measured 2.3 cm with some respirophasic variation, suggesting RA pressure 10 mmHg. Impressions:  - Normal LV size with EF 40-45%. Diffuse hypokinesis with septal-lateral dyssynchrony. Normal RV size with mildly decreased systolic function. Moderate biatrial enlargement. Mild pulmonary hypertension. Mild MR and AI.    ASSESSMENT/PLAN:    79 year old female with acute on chronic combined systolic/diastolic heart failure, atrial flutter persistent, secondary pulmonary hypertension, status post Bentall procedure with aortic valve sparing secondary to  type A aortic dissection on 11/08/12 on no chronic anticoagulation because of intramural hematoma.  1. Acute on chronic systolic/diastolic heart failure-agree with IV Lasix. Creatinine slightly increased. BNP was increased on admission. Counseled on salt, fluid restriction. She appears quite comfortable currently. I think that another day of IV Lasix will be helpful however. In my recent clinic encounter, I restarted her Lasix.  2. Atrial flutter-persistent, now well rate controlled. Do not feel safe performing cardioversion because of inability to anticoagulate. Approximately 4 years ago in [place] she did have a cardioversion performed. Did not sustain sinus rhythm. Since she has shown persistent atrial flutter, I would like to go ahead and discontinue her amiodarone as a rhythm control strategy does not appear to be plausible. Continuing with digoxin, metoprolol, diltiazem.  3. Status post Bentall procedure for type A aortic dissection-stable.  4. Hypokalemia-resolved. Continue to replete with IV Lasix.  5. Chronic kidney disease stage III-continue to monitor closely.  Challenging to obtain a  clear history however she has been having this symptom of trouble sleeping off and on since the surgery in March 2014.  We will try her best to maintain decreased intravascular volume as her creatinine tolerates.   Donato Schultz, MD  08/10/2013  10:47 AM

## 2013-08-10 NOTE — Evaluation (Signed)
Physical Therapy Evaluation Patient Details Name: [name] MRN: [id number] DOB: [birth year] Today's Date: 08/10/2013 Time: 4098-1191 PT Time Calculation (min): 23 min  PT Assessment / Plan / Recommendation History of Present Illness  [name] is a 79 y.o. female with past medical history of aortic root aneurysm status post repair, combined CHF and atrial fibrillation. Patient came into the hospital because of shortness of breath. For the past 10 days patient has some chest pressure, shortness of breath especially when laying flat, cough and congestion. Patient seen by her cardiologist on the 24th in her Lasix dose was increased, patient continues to have same problem so she presented to the emergency department today.  Clinical Impression   Patient evaluated by Physical Therapy with no further acute PT needs identified. All education has been completed and the patient has no further questions.  See below for any follow-up Physial Therapy or equipment needs. PT is signing off. Thank you for this referral.     PT Assessment  Patent does not need any further PT services    Follow Up Recommendations  No PT follow up    Does the patient have the potential to tolerate intense rehabilitation      Barriers to Discharge        Equipment Recommendations  None recommended by PT    Recommendations for Other Services     Frequency      Precautions / Restrictions Precautions Precautions: None   Pertinent Vitals/Pain no apparent distress       Mobility  Bed Mobility Bed Mobility: Supine to Sit Supine to Sit: 7: Independent Transfers Transfers: Sit to Stand;Stand to Sit Sit to Stand: 7: Independent Stand to Sit: 7: Independent Ambulation/Gait Ambulation/Gait Assistance: 7: Independent Ambulation Distance (Feet): 300 Feet Assistive device: None Ambulation/Gait Assistance Details: No LOB; WNL Gait Pattern: Within Functional Limits Stairs: Yes Stairs Assistance: 5:  Supervision Stairs Assistance Details (indicate cue type and reason): No difficulty Stair Management Technique: One rail Left;Alternating pattern;Forwards Number of Stairs: 12    Exercises     PT Diagnosis:    PT Problem List:   PT Treatment Interventions:       PT Goals(Current goals can be found in the care plan section) Acute Rehab PT Goals Patient Stated Goal: Home PT Goal Formulation: No goals set, d/c therapy  Visit Information  Last PT Received On: 08/10/13 Assistance Needed: +1 History of Present Illness: [name] is a 79 y.o. female with past medical history of aortic root aneurysm status post repair, combined CHF and atrial fibrillation. Patient came into the hospital because of shortness of breath. For the past 10 days patient has some chest pressure, shortness of breath especially when laying flat, cough and congestion. Patient seen by her cardiologist on the 24th in her Lasix dose was increased, patient continues to have same problem so she presented to the emergency department today.       Prior Functioning  Home Living Family/patient expects to be discharged to:: Private residence Living Arrangements: Children Available Help at Discharge: Family Type of Home: Apartment Home Access: Stairs to enter Secretary/administrator of Steps: flight Entrance Stairs-Rails: Left Home Layout: One level Home Equipment: None Prior Function Level of Independence: Independent Communication Communication: Prefers language other than English ([place])    Cognition  Cognition Arousal/Alertness: Awake/alert Behavior During Therapy: WFL for tasks assessed/performed Overall Cognitive Status: Within Functional Limits for tasks assessed    Extremity/Trunk Assessment Upper Extremity Assessment Upper Extremity Assessment: Overall WFL for tasks  assessed Lower Extremity Assessment Lower Extremity Assessment: Overall WFL for tasks assessed   Balance    End of Session PT - End of  Session Activity Tolerance: Patient tolerated treatment well Patient left: in chair;with call bell/phone within reach  GP     Van Clines [facility] [place], [place] [phone]  08/10/2013, 12:25 PM

## 2013-08-11 ENCOUNTER — Other Ambulatory Visit: Payer: Self-pay | Admitting: *Deleted

## 2013-08-11 LAB — BASIC METABOLIC PANEL
BUN: 26 mg/dL — ABNORMAL HIGH (ref 6–23)
CO2: 30 mEq/L (ref 19–32)
Calcium: 9.3 mg/dL (ref 8.4–10.5)
Chloride: 97 mEq/L (ref 96–112)
Creatinine, Ser: 1.4 mg/dL — ABNORMAL HIGH (ref 0.50–1.10)
GFR calc Af Amer: 39 mL/min — ABNORMAL LOW (ref >90)
GFR calc non Af Amer: 34 mL/min — ABNORMAL LOW (ref >90)
Glucose, Bld: 114 mg/dL — ABNORMAL HIGH (ref 70–99)
Potassium: 3.7 mEq/L (ref 3.5–5.1)
Sodium: 138 mEq/L (ref 135–145)

## 2013-08-11 MED ORDER — DIGOXIN 125 MCG PO TABS: 0.1250 mg | ORAL_TABLET | Freq: Every day | ORAL | Status: DC

## 2013-08-11 MED ORDER — ENSURE COMPLETE PO LIQD
237.0000 mL | ORAL | Status: DC
  Administered 2013-08-11: 237 mL via ORAL

## 2013-08-11 MED ORDER — FUROSEMIDE 40 MG PO TABS
40.0000 mg | ORAL_TABLET | Freq: Two times a day (BID) | ORAL | Status: DC
Start: 2013-08-11 — End: 2013-08-11
  Filled 2013-08-11 (×2): qty 1

## 2013-08-11 MED ORDER — POTASSIUM CHLORIDE ER 20 MEQ PO TBCR: 40.0000 meq | EXTENDED_RELEASE_TABLET | Freq: Every day | ORAL | Status: DC

## 2013-08-11 NOTE — Progress Notes (Signed)
INITIAL NUTRITION ASSESSMENT  DOCUMENTATION CODES Per approved criteria  -Severe malnutrition in the context of chronic illness   INTERVENTION: Add Ensure Complete po daily, each supplement provides 350 kcal and 13 grams of protein. RD to continue to follow.  NUTRITION DIAGNOSIS: Inadequate oral intake related to variable appetite as evidenced by pt report.   Goal: Intake to meet >90% of estimated nutrition needs.  Monitor:  weight trends, lab trends, I/O's, PO intake, supplement tolerance  Reason for Assessment: Malnutrition Screening Tool  79 y.o. female  Admitting Dx: Acute on chronic combined systolic and diastolic congestive heart failure  ASSESSMENT: PMHx significant for aortic root aneurysm s/p repair, combined CHF and afib. Admitted with SOB. Work-up reveals acute on chronic CHF.  Pt confirms ongoing weight loss. She states that she got sick earlier this year and her appetite has not returned to baseline. She is presently eating 50-100% of meals.  Nutrition Focused Physical Exam:  Subcutaneous Fat:  Orbital Region: WNL Upper Arm Region: moderate depletion Thoracic and Lumbar Region: n/a  Muscle:  Temple Region: moderate depletion Clavicle Bone Region: moderate depletion Clavicle and Acromion Bone Region: n/a Scapular Bone Region: n/a Dorsal Hand: n/a Patellar Region: WNL Anterior Thigh Region: moderate depletion Posterior Calf Region: severe depletion  Edema: n/a  Pt meets criteria for severe MALNUTRITION in the context of chronic illness as evidenced by 22% wt loss x 9 months and severe muscle mass loss.    Height: Ht Readings from Last 1 Encounters:  08/09/13 5' (1.524 m)    Weight: Wt Readings from Last 1 Encounters:  08/11/13 117 lb 4.6 oz (53.2 kg)    Ideal Body Weight: 100 lb  % Ideal Body Weight: 117%  Wt Readings from Last 20 Encounters:  08/11/13 117 lb 4.6 oz (53.2 kg)  08/06/13 121 lb (54.885 kg)  07/29/13 128 lb (58.06 kg)   07/23/13 125 lb (56.7 kg)  06/20/13 124 lb (56.246 kg)  06/18/13 125 lb (56.7 kg)  06/03/13 125 lb 6.4 oz (56.881 kg)  05/28/13 121 lb (54.885 kg)  05/16/13 121 lb 11.2 oz (55.203 kg)  05/07/13 130 lb (58.968 kg)  03/19/13 130 lb (58.968 kg)  03/10/13 132 lb 6.4 oz (60.056 kg)  01/01/13 147 lb (66.679 kg)  12/13/12 147 lb (66.679 kg)  11/19/12 147 lb 11.3 oz (67 kg)  11/19/12 147 lb 11.3 oz (67 kg)  11/07/12 149 lb (67.586 kg)    Usual Body Weight: 150 lb  % Usual Body Weight: 78%  BMI:  Body mass index is 22.91 kg/(m^2). Normal weight  Estimated Nutritional Needs: Kcal: 1500 - 1700 Protein: 50 - 60 g  Fluid: 1.5 liters daily  Skin: intact  Diet Order: Cardiac  EDUCATION NEEDS: -No education needs identified at this time   Intake/Output Summary (Last 24 hours) at 08/11/13 1116 Last data filed at 08/11/13 0830  Gross per 24 hour  Intake    680 ml  Output    400 ml  Net    280 ml    Last BM: 12/27  Labs:   Recent Labs Lab 08/09/13 0930 08/10/13 0539 08/11/13 0605  NA 136 139 138  K 3.9 3.5 3.7  CL 99 99 97  CO2 25 26 30   BUN 21 21 26*  CREATININE 1.04 1.26* 1.40*  CALCIUM 9.0 9.2 9.3  GLUCOSE 157* 97 114*    CBG (last 3)  No results found for this basename: GLUCAP,  in the last 72 hours  Scheduled Meds: .  digoxin  0.125 mg Oral Daily  . diltiazem  120 mg Oral Daily  . furosemide  40 mg Oral BID  . heparin  5,000 Units Subcutaneous Q8H  . metoprolol succinate  50 mg Oral BID  . polyethylene glycol  17 g Oral Daily  . sodium chloride  3 mL Intravenous Q12H  . Vitamin D (Ergocalciferol)  50,000 Units Oral Q7 days    Continuous Infusions:   Past Medical History  Diagnosis Date  . Afib   . Aortic root aneurysm   . Hypertension   . CHF (congestive heart failure)   . Shortness of breath   . Aortic atherosclerosis 06/03/2013  . Hypokalemia 08/06/2013    Past Surgical History  Procedure Laterality Date  . [place] procedure N/A  11/11/2012    Procedure: BENTALL PROCEDURE;  Surgeon: Kerin Perna, MD;  Location: [facility] OR;  Service: Open Heart Surgery;  Laterality: N/A;  *CIRC ARREST*   . Intraoperative transesophageal echocardiogram N/A 11/11/2012    Procedure: INTRAOPERATIVE TRANSESOPHAGEAL ECHOCARDIOGRAM;  Surgeon: Kerin Perna, MD;  Location: [facility] OR;  Service: Open Heart Surgery;  Laterality: N/A;  . Ascending aortic aneurysm repair  10/2012    Jarold Motto MS, RD, LDN Pager: [phone] After-hours pager: [phone]

## 2013-08-11 NOTE — Discharge Summary (Signed)
Physician Discharge Summary  [name] ZOX:[id number] DOB: 1928-03-02 DOA: 08/09/2013  PCP: Emeterio Reeve, MD  Admit date: 08/09/2013 Discharge date: 08/11/2013  Time spent: Less than 30 minutes  Recommendations for Outpatient Follow-up:  1. Dr. Donato Schultz, Cardiology on 08/19/13 at 1:45 PM with repeat labs (BMP). Lisinopril held until outpatient followup 2. Dr. Mila Palmer, PCP  Discharge Diagnoses:  Principal Problem:   Acute on chronic combined systolic and diastolic congestive heart failure Active Problems:   Afib   Hypertension   Intramural aortic hematoma   Hypoxia   Discharge Condition: Improved & Stable  Diet recommendation: Heart healthy diet  Filed Weights   08/09/13 1528 08/10/13 0540 08/11/13 0547  Weight: 54.8 kg (120 lb 13 oz) 53.9 kg (118 lb 13.3 oz) 53.2 kg (117 lb 4.6 oz)    History of present illness:  79 year old female with history of persistent atrial flutter, prior aortic root repair, no anticoagulation secondary to previous expanding aortic hematoma, intermittent hoarseness of voice, chronic combined systolic/diastolic heart failure, secondary pulmonary hypertension presented with worsening dyspnea, chest pressure and was admitted for evaluation and management of acute on chronic combined CHF.  Hospital Course:   Acute on chronic combined CHF  -Patient has history of LVEF of 43% and grade 2 diastolic dysfunction.  -Came in with symptoms suggesting acute CHF exacerbation. BNP was increased on admission.  -Patient started on Lasix 40 mg 3 times a day - Cardiology consulted. Patient has clinically improved. Cardiology has cleared her for discharge and close outpatient followup. - CTA chest was negative for PE.  Atrial fibrillation  -Patient rate is controlled with Toprol-XL, digoxin and Cardizem.  -No anticoagulation because of history of aortic hematoma  -Cardiology consulted: Have discontinued amiodarone as a rhythm control strategy does not  appear to be plausible. Cardiology also does not recommend performing cardioversion because of inability to anticoagulate. Digoxin level is low.  History of aortic root aneurysm  -Status post repair done by Dr. Morton Peters on March of this year.  -She has evidence of intramural thrombus, no anticoagulation is recommended.   Hoarseness of voice  -Patient said she is weak and hoarse voice since surgery in March.  -? 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 Outpatient followup.  Stage III chronic kidney disease  - Monitor creatinine closely while on diuresis.  - The patient's creatinine has slightly increased to 1.4 secondary to diuresis. Follow BMP in a week's time. Lasix changed to prior home dose. Lisinopril held temporarily secondary to soft blood pressures and worsening creatinine.  Hypokalemia  - Resolved. Resumed potassium at lower dose secondary to worsening creatinine.   Consultations:  Cardiology  Procedures:  None    Discharge Exam:  Complaints:  Denies complaints. Denies SOB or CP.  Filed Vitals:   08/10/13 2042 08/11/13 0547 08/11/13 0959 08/11/13 1000  BP: 105/76 118/84 96/67   Pulse: 86 87 101 101  Temp: 97.5 F (36.4 C) 98.1 F (36.7 C)    TempSrc: Oral Oral    Resp: 17 18    Height:      Weight:  53.2 kg (117 lb 4.6 oz)    SpO2: 96% 93%      General exam: Elderly female comfortably sitting on chair.  Respiratory system: Clear. No increased work of breathing.  Cardiovascular system: S1 & S2 heard, irregularly irregular. No JVD, murmurs, gallops, clicks or pedal edema.Telemetry: A. fib with controlled ventricular rate Gastrointestinal system: Abdomen is nondistended, soft and nontender. Normal bowel sounds  heard.  Central nervous system: Alert and oriented. No focal neurological deficits.  Extremities: Symmetric 5 x 5 power.   Discharge Instructions      Discharge Orders   Future Appointments Provider Department Dept Phone    08/13/2013 10:30 AM Kerin Perna, MD [facility] [phone]   08/19/2013 1:45 PM [facility] [place] Office [phone]   08/19/2013 2:00 PM Donato Schultz, MD [facility] [phone]   Future Orders Complete By Expires   (HEART FAILURE PATIENTS) Call MD:  Anytime you have any of the following symptoms: 1) 3 pound weight gain in 24 hours or 5 pounds in 1 week 2) shortness of breath, with or without a dry hacking cough 3) swelling in the hands, feet or stomach 4) if you have to sleep on extra pillows at night in order to breathe.  As directed    Call MD for:  difficulty breathing, headache or visual disturbances  As directed    Call MD for:  extreme fatigue  As directed    Call MD for:  persistant dizziness or light-headedness  As directed    Call MD for:  severe uncontrolled pain  As directed    Diet - low sodium heart healthy  As directed    Increase activity slowly  As directed        Medication List    STOP taking these medications       amiodarone 200 MG tablet  Commonly known as:  PACERONE     lisinopril 20 MG tablet  Commonly known as:  PRINIVIL,ZESTRIL      TAKE these medications       digoxin 0.125 MG tablet  Commonly known as:  LANOXIN  Take 0.125 mg by mouth daily.     diltiazem 120 MG 24 hr capsule  Commonly known as:  CARDIZEM CD  Take 120 mg by mouth daily.     furosemide 40 MG tablet  Commonly known as:  LASIX  Take 1 tablet (40 mg total) by mouth 2 (two) times daily.     metoprolol succinate 50 MG 24 hr tablet  Commonly known as:  TOPROL-XL  Take 50 mg by mouth 2 (two) times daily. Take with or immediately following a meal.     Potassium Chloride ER 20 MEQ Tbcr  Take 40 mEq by mouth daily.     Vitamin D (Ergocalciferol) 50000 UNITS Caps capsule  Commonly known as:  DRISDOL  Take 50,000 Units by mouth every 7 (seven) days.       Follow-up Information   Follow up with  Donato Schultz, MD On 08/19/2013. (1:45 for lab test and visit with Dr. Anne Fu)    Specialty:  Cardiology   Contact information:   [address] [place] [address] [phone]       Schedule an appointment as soon as possible for a visit with Emeterio Reeve, MD.   Specialty:  Family Medicine   Contact information:   [address] [place] [address] [phone]        The results of significant diagnostics from this hospitalization (including imaging, microbiology, ancillary and laboratory) are listed below for reference.    Significant Diagnostic Studies: Dg Chest 2 View  08/09/2013   CLINICAL DATA:  Shortness of Breath; left-sided chest pain  EXAM: CHEST  2 VIEW  COMPARISON:  August 01, 2013  FINDINGS: The known thoracic aortic aneurysm appears unchanged. Cardiomegaly  is likewise unchanged. Patient is status post median sternotomy.  There is no edema or consolidation. No adenopathy. There are postoperative changes over the right upper hemithorax, stable.  IMPRESSION: No edema or consolidation. Cardiomegaly as well as known thoracic aortic aneurysm appear grossly stable.   Electronically Signed   By: Bretta Bang M.D.   On: 08/09/2013 10:02   Ct Angio Chest Pe W/cm &/or Wo Cm  08/09/2013   CLINICAL DATA:  Chest pressure, cough, shortness of breath and wheezing. History of ascending thoracic aortic dissection repair.  EXAM: CT ANGIOGRAPHY CHEST WITH CONTRAST  TECHNIQUE: Multidetector CT imaging of the chest was performed using the standard protocol during bolus administration of intravenous contrast. Multiplanar CT image reconstructions including MIPs were obtained to evaluate the vascular anatomy.  CONTRAST:  80mL OMNIPAQUE IOHEXOL 350 MG/ML SOLN  COMPARISON:  05/08/2013 as well as multiple additional prior exams.  FINDINGS: Contrast timing was performed to evaluate the pulmonary arteries which are well opacified. There is no evidence of  pulmonary embolism. Stable dilatation of central pulmonary arteries is identified.  Contrast does reflux into the inferior vena cava as well as dilated central hepatic veins. Findings are consistent with right heart failure. On lung windows, there is some mild pulmonary vascular and interstitial prominence without overt airspace edema or pleural effusions.  Stable density of the ascending thoracic aorta related to prior replacement of the aortic root. There is stable aneurysmal disease at the level of the aortic arch and descending thoracic aorta with maximal aortic caliber of approximately 6 cm at the level of the proximal descending thoracic aorta. The study is not timed to evaluate any changes within the lumen of the thoracic aorta other than gross aortic diameter. No mediastinal hemorrhage is identified.  There is stable cardiomegaly. No pericardial fluid is seen. No masses or enlarged lymph nodes are identified. The bony thorax is unremarkable.  Review of the MIP images confirms the above findings.  IMPRESSION: 1. No evidence of pulmonary embolism. Stable dilated central hepatic arteries. 2. Evidence of chronic right heart failure with pulmonary vascular and interstitial prominence likely reflecting chronic edema. 3. Stable postoperative changes of the thoracic aorta and aneurysmal dilatation of the residual native aorta. With current contrast timing, the lumen of the aorta is not opacified and unable to be evaluated. Gross diameter of the aorta is unchanged.   Electronically Signed   By: Irish Lack M.D.   On: 08/09/2013 11:57    Microbiology: No results found for this or any previous visit (from the past 240 hour(s)).   Labs: Basic Metabolic Panel:  Recent Labs Lab 08/06/13 1149 08/09/13 0930 08/10/13 0539 08/11/13 0605  NA 140 136 139 138  K 2.5* 3.9 3.5 3.7  CL 99 99 99 97  CO2 31 25 26 30   GLUCOSE 110* 157* 97 114*  BUN 24* 21 21 26*  CREATININE 1.3* 1.04 1.26* 1.40*  CALCIUM 8.8  9.0 9.2 9.3   Liver Function Tests:  Recent Labs Lab 08/09/13 0930  AST 30  ALT 33  ALKPHOS 96  BILITOT 0.7  PROT 7.6  ALBUMIN 3.1*   No results found for this basename: LIPASE, AMYLASE,  in the last 168 hours No results found for this basename: AMMONIA,  in the last 168 hours CBC:  Recent Labs Lab 08/09/13 0930 08/10/13 0539  WBC 7.9 6.4  NEUTROABS 5.7  --   HGB 13.6 13.7  HCT 41.0 41.6  MCV 94.5 94.5  PLT 367 403*  Cardiac Enzymes:  Recent Labs Lab 08/09/13 0930 08/09/13 1211  TROPONINI <0.30 <0.30   BNP: BNP (last 3 results)  Recent Labs  03/06/13 0415 08/01/13 1430 08/09/13 0930  PROBNP 5114.0* 9024.0* 8761.0*   CBG: No results found for this basename: GLUCAP,  in the last 168 hours  Additional labs: 1. D-dimer: 2.91 2. Digoxin level: 0.7 3. TSH: 1.969   Signed:  Marcellus Scott, MD, FACP, FHM. [facility] Pager [phone]  If 7PM-7AM, please contact night-coverage [email or web address] Password [facility] 08/11/2013, 2:24 PM

## 2013-08-11 NOTE — Care Management Note (Signed)
    Page 1 of 1   08/11/2013     2:53:25 PM   CARE MANAGEMENT NOTE 08/11/2013  Patient:  [name]   Account Number:  [id number]  Date Initiated:  08/11/2013  Documentation initiated by:  Donato Schultz  Subjective/Objective Assessment:   Admitted with Hypoxia     Action/Plan:   CM will monitor for d/c planning needs   Anticipated DC Date:  08/14/2013   Anticipated DC Plan:  HOME/SELF CARE         Choice offered to / List presented to:             Status of service:  Completed, signed off Medicare Important Message given?   (If response is "NO", the following Medicare IM given date fields will be blank) Date Medicare IM given:   Date Additional Medicare IM given:    Discharge Disposition:    Per UR Regulation:  Reviewed for med. necessity/level of care/duration of stay  If discussed at Long Length of Stay Meetings, dates discussed:    Comments:  PT recs:  No f/u needed DME recs:  NO DME needs identified. Crystal Hutchinson RN, BSN, [place], [place] 08/11/2013

## 2013-08-11 NOTE — Progress Notes (Addendum)
SUBJECTIVE:  Breathing better.  OBJECTIVE:   Vitals:   Filed Vitals:   08/10/13 2042 08/11/13 0547 08/11/13 0959 08/11/13 1000  BP: 105/76 118/84 96/67   Pulse: 86 87 101 101  Temp: 97.5 F (36.4 C) 98.1 F (36.7 C)    TempSrc: Oral Oral    Resp: 17 18    Height:      Weight:  117 lb 4.6 oz (53.2 kg)    SpO2: 96% 93%     I&O's:   Intake/Output Summary (Last 24 hours) at 08/11/13 1342 Last data filed at 08/11/13 0830  Gross per 24 hour  Intake    440 ml  Output    400 ml  Net     40 ml   TELEMETRY: Reviewed telemetry pt in AFib:     PHYSICAL EXAM General: Well developed, well nourished, in no acute distress Head: Eyes PERRLA, No xanthomas.   Normal cephalic and atramatic  Lungs:  *Clear bilaterally to auscultation and percussion. Heart:  Ireregularly irregular Pulses are 2+ & equal.            No carotid bruit. No JVD.  No abdominal bruits. No femoral bruits. Abdomen: Bowel sounds are positive, abdomen soft and non-tender without masses or                  Hernia's noted. Msk:  Back normal, normal gait. Normal strength and tone for age. Extremities:   No  edema.  DP +1 Neuro: Alert and oriented X 3. Psych:  Normal affect, responds appropriately   LABS: Basic Metabolic Panel:  Recent Labs  16/10/96 0539 08/11/13 0605  NA 139 138  K 3.5 3.7  CL 99 97  CO2 26 30  GLUCOSE 97 114*  BUN 21 26*  CREATININE 1.26* 1.40*  CALCIUM 9.2 9.3   Liver Function Tests:  Recent Labs  08/09/13 0930  AST 30  ALT 33  ALKPHOS 96  BILITOT 0.7  PROT 7.6  ALBUMIN 3.1*   No results found for this basename: LIPASE, AMYLASE,  in the last 72 hours CBC:  Recent Labs  08/09/13 0930 08/10/13 0539  WBC 7.9 6.4  NEUTROABS 5.7  --   HGB 13.6 13.7  HCT 41.0 41.6  MCV 94.5 94.5  PLT 367 403*   Cardiac Enzymes:  Recent Labs  08/09/13 0930 08/09/13 1211  TROPONINI <0.30 <0.30   BNP: No components found with this basename: POCBNP,  D-Dimer:  Recent Labs   08/09/13 1038  DDIMER 2.91*   Hemoglobin A1C: No results found for this basename: HGBA1C,  in the last 72 hours Fasting Lipid Panel: No results found for this basename: CHOL, HDL, LDLCALC, TRIG, CHOLHDL, LDLDIRECT,  in the last 72 hours Thyroid Function Tests:  Recent Labs  08/10/13 0539  TSH 1.969   Anemia Panel: No results found for this basename: VITAMINB12, FOLATE, FERRITIN, TIBC, IRON, RETICCTPCT,  in the last 72 hours Coag Panel:   Lab Results  Component Value Date   INR 1.10 08/09/2013   INR 1.21 05/10/2013   INR 1.16 05/09/2013    RADIOLOGY: Dg Chest 2 View  08/09/2013   CLINICAL DATA:  Shortness of Breath; left-sided chest pain  EXAM: CHEST  2 VIEW  COMPARISON:  August 01, 2013  FINDINGS: The known thoracic aortic aneurysm appears unchanged. Cardiomegaly is likewise unchanged. Patient is status post median sternotomy.  There is no edema or consolidation. No adenopathy. There are postoperative changes over the right upper hemithorax, stable.  IMPRESSION: No edema or consolidation. Cardiomegaly as well as known thoracic aortic aneurysm appear grossly stable.   Electronically Signed   By: Bretta Bang M.D.   On: 08/09/2013 10:02   Dg Chest 2 View  08/01/2013   CLINICAL DATA:  Cough.  Weakness.  EXAM: CHEST  2 VIEW  COMPARISON:  05/28/2013  FINDINGS: Cardiac silhouette is enlarged but stable. Stable changes are noted from a prior median sternotomy. A aorta is diffusely enlarged reflecting the known aneurysm. Is also uncoiled. No mediastinal or hilar masses.  Lungs are hyperexpanded. Mild reticular scarring and/or subsegmental atelectasis is noted in the bases. The lungs are otherwise clear. No pleural effusion or pneumothorax.  The bony thorax is demineralized but intact.  IMPRESSION: No acute cardiopulmonary disease. Stable appearance from the prior study.   Electronically Signed   By: Amie Portland M.D.   On: 08/01/2013 13:41   Ct Angio Chest Pe W/cm &/or Wo  Cm  08/09/2013   CLINICAL DATA:  Chest pressure, cough, shortness of breath and wheezing. History of ascending thoracic aortic dissection repair.  EXAM: CT ANGIOGRAPHY CHEST WITH CONTRAST  TECHNIQUE: Multidetector CT imaging of the chest was performed using the standard protocol during bolus administration of intravenous contrast. Multiplanar CT image reconstructions including MIPs were obtained to evaluate the vascular anatomy.  CONTRAST:  80mL OMNIPAQUE IOHEXOL 350 MG/ML SOLN  COMPARISON:  05/08/2013 as well as multiple additional prior exams.  FINDINGS: Contrast timing was performed to evaluate the pulmonary arteries which are well opacified. There is no evidence of pulmonary embolism. Stable dilatation of central pulmonary arteries is identified.  Contrast does reflux into the inferior vena cava as well as dilated central hepatic veins. Findings are consistent with right heart failure. On lung windows, there is some mild pulmonary vascular and interstitial prominence without overt airspace edema or pleural effusions.  Stable density of the ascending thoracic aorta related to prior replacement of the aortic root. There is stable aneurysmal disease at the level of the aortic arch and descending thoracic aorta with maximal aortic caliber of approximately 6 cm at the level of the proximal descending thoracic aorta. The study is not timed to evaluate any changes within the lumen of the thoracic aorta other than gross aortic diameter. No mediastinal hemorrhage is identified.  There is stable cardiomegaly. No pericardial fluid is seen. No masses or enlarged lymph nodes are identified. The bony thorax is unremarkable.  Review of the MIP images confirms the above findings.  IMPRESSION: 1. No evidence of pulmonary embolism. Stable dilated central hepatic arteries. 2. Evidence of chronic right heart failure with pulmonary vascular and interstitial prominence likely reflecting chronic edema. 3. Stable postoperative  changes of the thoracic aorta and aneurysmal dilatation of the residual native aorta. With current contrast timing, the lumen of the aorta is not opacified and unable to be evaluated. Gross diameter of the aorta is unchanged.   Electronically Signed   By: Irish Lack M.D.   On: 08/09/2013 11:57      ASSESSMENT:  Systolic/diastolic heart failure, AFib  PLAN:  Appears euvolemic.  OK to d/c on Lasix 40 mg BID.  Rate control.  Unable to maintain NSR even on Amiodarone.  F/u in a week with BMet.   Corky Crafts., MD  08/11/2013  1:42 PM

## 2013-08-11 NOTE — Progress Notes (Signed)
Patient evaluated for community based chronic disease management services with [facility] Care Management Program as a benefit of patient's [employer]. Spoke with patient daughter Meliton Rattan [phone]) via phone to explain [facility] Care Management services.  Patient has been engaged with [facility] until October 2014.  Daughter would like to again use services for CHF education and nutritional support.  Verbal consent for services received from daughter as patient is not comfortable signing documents is absence of her daughter.  Patient will receive a post discharge transition of care call and will be evaluated for monthly home visits for assessments and disease process education.  Left contact information and [facility] literature at bedside. Made Inpatient Case Manager aware that [facility] Care Management following. Of note, [facility] Care Management services does not replace or interfere with any services that are arranged by inpatient case management or social work.  For additional questions or referrals please contact Anibal Henderson BSN RN [facility] Liaison at [phone].

## 2013-08-11 NOTE — Progress Notes (Signed)
D/C Tele, D/C IV, D/C instructions reviewed with pt. And pt.'s daughter, Pt. And daughter verbalized understanding, Pt. Left unit via Wheelchair and transported home via her daughter, D/C paperwork given to pt.'s daughter. Pt. Showed no signs or symptoms of distress or discomfort.

## 2013-08-12 ENCOUNTER — Other Ambulatory Visit: Payer: Self-pay | Admitting: *Deleted

## 2013-08-12 DIAGNOSIS — I712 Thoracic aortic aneurysm, without rupture: Secondary | ICD-10-CM

## 2013-08-13 ENCOUNTER — Ambulatory Visit: Payer: Commercial Managed Care - HMO | Admitting: Cardiothoracic Surgery

## 2013-08-19 ENCOUNTER — Encounter: Payer: Self-pay | Admitting: Cardiology

## 2013-08-19 ENCOUNTER — Ambulatory Visit (INDEPENDENT_AMBULATORY_CARE_PROVIDER_SITE_OTHER): Payer: Medicare HMO | Admitting: Cardiology

## 2013-08-19 ENCOUNTER — Other Ambulatory Visit: Payer: Commercial Managed Care - HMO

## 2013-08-19 VITALS — BP 118/72 | HR 84 | Ht 60.0 in | Wt 120.0 lb

## 2013-08-19 DIAGNOSIS — I509 Heart failure, unspecified: Secondary | ICD-10-CM

## 2013-08-19 DIAGNOSIS — I5043 Acute on chronic combined systolic (congestive) and diastolic (congestive) heart failure: Secondary | ICD-10-CM

## 2013-08-19 DIAGNOSIS — I4891 Unspecified atrial fibrillation: Secondary | ICD-10-CM

## 2013-08-19 DIAGNOSIS — I10 Essential (primary) hypertension: Secondary | ICD-10-CM

## 2013-08-19 LAB — BASIC METABOLIC PANEL
BUN: 28 mg/dL — ABNORMAL HIGH (ref 6–23)
CO2: 29 mEq/L (ref 19–32)
Calcium: 9.5 mg/dL (ref 8.4–10.5)
Chloride: 99 mEq/L (ref 96–112)
Creatinine, Ser: 1.5 mg/dL — ABNORMAL HIGH (ref 0.4–1.2)
GFR: 34.13 mL/min — ABNORMAL LOW (ref >60.00)
Glucose, Bld: 101 mg/dL — ABNORMAL HIGH (ref 70–99)
Potassium: 4 mEq/L (ref 3.5–5.1)
Sodium: 137 mEq/L (ref 135–145)

## 2013-08-19 NOTE — Patient Instructions (Signed)
Your physician recommends that you continue on your current medications as directed. Please refer to the Current Medication list given to you today.  Your physician recommends that you have labs today: BMET  Your physician recommends that you schedule a follow-up appointment in: 1 months with Dr. Anne Fu

## 2013-08-19 NOTE — Progress Notes (Signed)
[address] [place], [place]  [address] Phone: [phone] Fax:  [phone]  Date:  08/19/2013   ID:  [name] SchmidtAyse [name], DOB 02/23/1928, MRN [id number]  PCP:  Emeterio ReeveWOLTERS,SHARON A, MD   History of Present Illness: [name] is a 80 y.o. female with atrial fibrillation/flutter, aortic dissection thoracic repair by Dr. Maren BeachVanTrigt with atrial fibrillation, intramural hematoma of thoracic aorta not allowing chronic anticoagulation, chronic combined systolic/diastolic heart failure with ejection fraction of 40-45% on 05/09/13 here for followup.  Unfortunately, she was hospitalized 2 days after her last appointment on 08/09/13 secondary to increasing insomnia, trouble breathing at night/acute on chronic systolic heart failure. We utilized IV Lasix which seemed to help and she was currently comfortable after a daily dose. CT scan was negative for pulmonary and was him. Her atrial fibrillation was overall well rate controlled on Toprol, digoxin as well as Cardizem. Once again, not on anticoagulation because of intramural aortic hematoma. Amiodarone was discontinued because rhythm control did not seem to be plausible. No cardioversion because of inability to anticoagulate.    At previous visits, her blood pressure was on the low side. Her Toprol was cut significantly. She ended up having increase heart rate with her atrial fibrillation and I increased her Toprol once again back to 50 XL twice a day. Her heart rate is much improved previously but once again was increased diltiazem CD 120 mg was added. She denies any dizziness, syncope.  She did go to the emergency room 3 days after last office visit on 08/01/13 and was felt to be in a degree of heart failure. Her Lasix was increased to 80 mg over the weekend. She had mild lower extremity edema and minimal rales in the right lower lobes. A. Fib was improved with rest.     Wt Readings from Last 3 Encounters:  08/19/13 120 lb (54.432 kg)    08/11/13 117 lb 4.6 oz (53.2 kg)  08/06/13 121 lb (54.885 kg)     Past Medical History  Diagnosis Date  . Afib   . Aortic root aneurysm   . Hypertension   . CHF (congestive heart failure)   . Shortness of breath   . Aortic atherosclerosis 06/03/2013  . Hypokalemia 08/06/2013    Past Surgical History  Procedure Laterality Date  . Bentall procedure N/A 11/11/2012    Procedure: BENTALL PROCEDURE;  Surgeon: Kerin PernaPeter Van Trigt, MD;  [facility];  Service: Open Heart Surgery;  Laterality: N/A;  *[facility] ARREST*   . Intraoperative transesophageal echocardiogram N/A 11/11/2012    Procedure: INTRAOPERATIVE TRANSESOPHAGEAL ECHOCARDIOGRAM;  Surgeon: Kerin PernaPeter Van Trigt, MD;  [facility];  Service: Open Heart Surgery;  Laterality: N/A;  . Ascending aortic aneurysm repair  10/2012    Current Outpatient Prescriptions  Medication Sig Dispense Refill  . digoxin (LANOXIN) 0.125 MG tablet Take 1 tablet (0.125 mg total) by mouth daily.  30 tablet  1  . diltiazem (CARDIZEM CD) 120 MG 24 hr capsule Take 120 mg by mouth daily.      . furosemide (LASIX) 40 MG tablet Take 1 tablet (40 mg total) by mouth 2 (two) times daily.  30 tablet  3  . KLOR-CON M20 20 MEQ tablet Take 20 mEq by mouth daily.       . metoprolol succinate (TOPROL-XL) 50 MG 24 hr tablet Take 50 mg by mouth 2 (two) times daily. Take with or immediately following a meal.      .  Vitamin D, Ergocalciferol, (DRISDOL) 50000 UNITS CAPS capsule Take 50,000 Units by mouth every 7 (seven) days.       No current facility-administered medications for this visit.    Allergies:   No Known Allergies  Social History:  The patient  reports that she has never smoked. She has never used smokeless tobacco. She reports that she does not drink alcohol or use illicit drugs.   ROS:  Please see the history of present illness.   Denies any fevers, chills, orthopnea, PND, syncope  All other systems reviewed and negative.   PHYSICAL EXAM: VS:  BP 118/72  Pulse  84  Ht 5' (1.524 m)  Wt 120 lb (54.432 kg)  BMI 23.44 kg/m2 Well nourished, well developed, in no acute distress HEENT: normal Neck: no JVD Cardiac:  Irregularly irregular,normal rate; no murmur Lungs:  clear to auscultation bilaterally, no wheezing, rhonchi or rales Abd: soft, nontender, no hepatomegaly Ext: minimal edemaSkin: warm and dry Neuro: no focal abnormalities noted  EKG:  06/20/13: Atrial flutter/fibrillation heart rate 109, nonspecific ST-T wave changes, poor R wave progression, left axis deviation/prior inferior infarct pattern.   08/06/13-atrial flutter pattern with variable conduction, heart rate 91 beats per minute.   ASSESSMENT AND PLAN:  1. Atrial flutter/fibrillation- Stopped amiodarone in the hospital since it was not maintaining any semblance of rhythm control. Metoprolol extended release 50 mg twice a day and diltiazem CD 120 mg once a day to her drug regimen as well. She has been on this in the past in June of 2014. I am aware of her previous bout of hypotension. Nonetheless, her heart rate still seems better controlled. I do not feel comfortable cardioverting her because we cannot place her on anticoagulation She is currently not on Coumadin because of intramural hematoma of aorta which expanded. Aspirin only. 81 mg. 2. Acute on chronic diastolic/systolic heart failure-ER visit from 08/01/13 reviewed. Lasix 40 mg twice a day was administered. Her weight is down approximately 6 pounds. I will continue with Lasix 40 mg twice a day for maintenance. I will check basic metabolic profile today. Her potassium was slightly low at previous hospitalization. Continue with potassium supplementation Aortic dissection repair/Bentall procedure-doing well. Prior note reviewed. 3. Intramural hematoma of aorta. Unable to give Coumadin because of this. 4. Hypertension/hypotension-ACE inhibitor off. Limiting.Combined systolic/diastolic heart failure-EF 40-45%. Trying to rate control atrial  flutter/fibrillation as best as possible. Unable to utilize ACE inhibitor because of hypotension. 5. Cough/hoarseness-interestingly, she did not have this immediately after the surgery she states. I agree that this may be recurrent laryngeal nerve but I would've expected this immediately after surgery. Could this be GERD related or excess mucus production especially associated with cough. ACE inhibitor has been off.    Signed, Donato Schultz, MD [facility]  08/19/2013 1:59 PM

## 2013-08-20 ENCOUNTER — Telehealth: Payer: Self-pay | Admitting: Cardiology

## 2013-08-20 ENCOUNTER — Telehealth: Payer: Self-pay | Admitting: *Deleted

## 2013-08-20 NOTE — Telephone Encounter (Signed)
lmtcb for results. Number provided  

## 2013-08-20 NOTE — Telephone Encounter (Signed)
New Problem: ° °Pt is requesting a call back to hear recent test results.  °

## 2013-08-22 ENCOUNTER — Ambulatory Visit: Payer: Medicare PPO | Admitting: Cardiology

## 2013-08-27 NOTE — Telephone Encounter (Signed)
Results provided to patient's daughter

## 2013-09-10 ENCOUNTER — Encounter: Payer: Self-pay | Admitting: Cardiothoracic Surgery

## 2013-09-10 ENCOUNTER — Other Ambulatory Visit: Payer: Medicare HMO

## 2013-09-10 ENCOUNTER — Ambulatory Visit (INDEPENDENT_AMBULATORY_CARE_PROVIDER_SITE_OTHER): Payer: Medicare HMO | Admitting: Cardiothoracic Surgery

## 2013-09-10 VITALS — BP 112/72 | HR 65 | Resp 20 | Ht 60.0 in | Wt 120.0 lb

## 2013-09-10 DIAGNOSIS — Z8679 Personal history of other diseases of the circulatory system: Secondary | ICD-10-CM

## 2013-09-10 DIAGNOSIS — Z9889 Other specified postprocedural states: Secondary | ICD-10-CM

## 2013-09-10 DIAGNOSIS — I7100 Dissection of unspecified site of aorta: Secondary | ICD-10-CM

## 2013-09-10 DIAGNOSIS — I509 Heart failure, unspecified: Secondary | ICD-10-CM

## 2013-09-10 NOTE — Progress Notes (Signed)
PCP is Farris Has, MD Referring Provider is Emeterio Reeve, MD  Chief Complaint  Patient presents with  . Follow-up    6 month f/u wtih Chest CTA 08/09/13    HPI: One month followup for this 80 year old female who had emergency repair of type A dissection March 2014. She has chronic atrial fibrillation and when Coumadin was resumed 8 weeks postop he bled into her distal arch-descending aorta false lumen which was treated conservatively and all anticoagulation was stopped. She did not have a cardiac cath preop and has been complaining of angina. Her ejection fraction is 30% and she has diastolic heart failure currently well-controlled on a combination of Lasix beta blocker and Cardizem will give the patient a prescription for sublingual nitroglycerin which should help with her intermittent chest pain.   Past Medical History  Diagnosis Date  . Afib   . Aortic root aneurysm   . Hypertension   . CHF (congestive heart failure)   . Shortness of breath   . Aortic atherosclerosis 06/03/2013  . Hypokalemia 08/06/2013    Past Surgical History  Procedure Laterality Date  . Bentall procedure N/A 11/11/2012    Procedure: BENTALL PROCEDURE;  Surgeon: Kerin Perna, MD;  [facility];  Service: Open Heart Surgery;  Laterality: N/A;  *[facility] ARREST*   . Intraoperative transesophageal echocardiogram N/A 11/11/2012    Procedure: INTRAOPERATIVE TRANSESOPHAGEAL ECHOCARDIOGRAM;  Surgeon: Kerin Perna, MD;  Location: [facility] OR;  Service: Open Heart Surgery;  Laterality: N/A;  . Ascending aortic aneurysm repair  10/2012    Family History  Problem Relation Age of Onset  . Heart attack Neg Hx   . Heart disease Neg Hx   . Heart failure Neg Hx   . Hypertension Neg Hx     Social History History  Substance Use Topics  . Smoking status: Never Smoker   . Smokeless tobacco: Never Used  . Alcohol Use: No    Current Outpatient Prescriptions  Medication Sig Dispense Refill  . acetaminophen  (TYLENOL) 325 MG tablet Take 650 mg by mouth every 6 (six) hours as needed.      . digoxin (LANOXIN) 0.125 MG tablet Take 1 tablet (0.125 mg total) by mouth daily.  30 tablet  1  . diltiazem (CARDIZEM CD) 120 MG 24 hr capsule Take 120 mg by mouth daily.      . furosemide (LASIX) 40 MG tablet Take 1 tablet (40 mg total) by mouth 2 (two) times daily.  30 tablet  3  . KLOR-CON M20 20 MEQ tablet Take 20 mEq by mouth daily.       . metoprolol succinate (TOPROL-XL) 50 MG 24 hr tablet Take 50 mg by mouth 2 (two) times daily. Take with or immediately following a meal.      . Vitamin D, Ergocalciferol, (DRISDOL) 50000 UNITS CAPS capsule Take 50,000 Units by mouth every 7 (seven) days.       No current facility-administered medications for this visit.    No Known Allergies  Review of Systems complains of hoarseness which is probably related to expansion of the false lumen of her distal arch which occurred after she was resumed on Coumadin for her A. fib  BP 112/72  Pulse 65  Resp 20  Ht 5' (1.524 m)  Wt 120 lb (54.432 kg)  BMI 23.44 kg/m2  SpO2 97% Physical Exam Alert and pleasant breathing comfortably Lungs are clear Heart rate is controlled atrial fibrillation No murmur of AI No pedal edema Extremities  warm Neuro intact Walking independently  Diagnostic Tests: Last CT scan of chest at December reviewed showing intact repair with distal arch measuring 4.8 cm  Impression: Patient clinically stable on current regimen of the oral medications. I will add a prescription for when necessary sublingual nitroglycerin as it appears she is having some angina with unknown coronary status and she is not a candidate for cardiac cath. We'll continue to follow  Plan:

## 2013-09-22 ENCOUNTER — Encounter: Payer: Self-pay | Admitting: Cardiology

## 2013-09-22 ENCOUNTER — Ambulatory Visit (INDEPENDENT_AMBULATORY_CARE_PROVIDER_SITE_OTHER): Payer: Medicare HMO | Admitting: Cardiology

## 2013-09-22 VITALS — BP 126/84 | HR 62 | Ht 60.0 in | Wt 116.0 lb

## 2013-09-22 DIAGNOSIS — I5042 Chronic combined systolic (congestive) and diastolic (congestive) heart failure: Secondary | ICD-10-CM

## 2013-09-22 DIAGNOSIS — Z9889 Other specified postprocedural states: Secondary | ICD-10-CM

## 2013-09-22 DIAGNOSIS — I719 Aortic aneurysm of unspecified site, without rupture: Secondary | ICD-10-CM

## 2013-09-22 DIAGNOSIS — I10 Essential (primary) hypertension: Secondary | ICD-10-CM

## 2013-09-22 DIAGNOSIS — I7100 Dissection of unspecified site of aorta: Secondary | ICD-10-CM

## 2013-09-22 DIAGNOSIS — I7121 Aneurysm of the ascending aorta, without rupture: Secondary | ICD-10-CM

## 2013-09-22 DIAGNOSIS — I4891 Unspecified atrial fibrillation: Secondary | ICD-10-CM

## 2013-09-22 DIAGNOSIS — I359 Nonrheumatic aortic valve disorder, unspecified: Secondary | ICD-10-CM

## 2013-09-22 NOTE — Progress Notes (Signed)
[address], [address] [place], [place]  [address] Phone: [phone] Fax:  [phone]  Date:  09/22/2013   ID:  [name], DOB 02/20/1928, MRN [id number]  PCP:  Farris Has, MD   History of Present Illness: [name] is a 80 y.o. female with chronic atrial fibrillation/flutter, Type A aortic dissection thoracic repair by Dr. 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  Unfortunately, she was hospitalized 2 days after her prior appointment on 08/09/13 secondary to increasing insomnia, trouble breathing at night/acute on chronic systolic heart failure. We utilized IV Lasix which seemed to help and she was currently comfortable after a daily dose. CT scan was negative for pulmonary and was him. Her atrial fibrillation was overall well rate controlled on Toprol, digoxin as well as Cardizem. Once again, not on anticoagulation because of intramural aortic hematoma. Amiodarone was discontinued because rhythm control did not seem to be plausible. No cardioversion because of inability to anticoagulate.    At previous visits, her blood pressure was on the low side. Her Toprol was cut significantly. She ended up having increase heart rate with her atrial fibrillation and I increased her Toprol once again back to 50 XL twice a day. Her heart rate is much improved previously but once again was increased diltiazem CD 120 mg was added. She denies any dizziness, syncope.  She did go to the emergency room 3 days after last office visit on 08/01/13 and was felt to be in a degree of heart failure. Her Lasix was increased to 80 mg over the weekend. She had mild lower extremity edema and minimal rales in the right lower lobes. A. Fib was improved with rest.  Dr. Maren Beach prescribed her sublingual nitroglycerin when necessary. Remember,  unknown coronary artery status as she was not a candidate for cardiac catheterization at the time of repair. Overall she is doing well currently.     Wt Readings from Last 3 Encounters:  09/22/13 116 lb (52.617 kg)  09/10/13 120 lb (54.432 kg)  08/19/13 120 lb (54.432 kg)     Past Medical History  Diagnosis Date  . Afib   . Aortic root aneurysm   . Hypertension   . CHF (congestive heart failure)   . Shortness of breath   . Aortic atherosclerosis 06/03/2013  . Hypokalemia 08/06/2013    Past Surgical History  Procedure Laterality Date  . Bentall procedure N/A 11/11/2012    Procedure: BENTALL PROCEDURE;  Surgeon: Kerin Perna, MD;  Location: [facility] OR;  Service: Open Heart Surgery;  Laterality: N/A;  *CIRC ARREST*   . Intraoperative transesophageal echocardiogram N/A 11/11/2012    Procedure: INTRAOPERATIVE TRANSESOPHAGEAL ECHOCARDIOGRAM;  Surgeon: Kerin Perna, MD;  Location: [facility] OR;  Service: Open Heart Surgery;  Laterality: N/A;  . Ascending aortic aneurysm repair  10/2012    Current Outpatient Prescriptions  Medication Sig Dispense Refill  . acetaminophen (TYLENOL) 325 MG tablet Take 650 mg by mouth every 6 (six) hours as needed.      Marland Kitchen aspirin 81 MG tablet Take 81 mg by mouth daily.      . Cholecalciferol (VITAMIN D PO) Take 1,000 mg by mouth daily.      . digoxin (LANOXIN) 0.125 MG tablet Take 1 tablet (0.125 mg total) by mouth daily.  30 tablet  1  . diltiazem (CARDIZEM CD) 120 MG  24 hr capsule Take 120 mg by mouth daily.      . furosemide (LASIX) 40 MG tablet Take 1 tablet (40 mg total) by mouth 2 (two) times daily.  30 tablet  3  . KLOR-CON M20 20 MEQ tablet Take 20 mEq by mouth daily.       . metoprolol succinate (TOPROL-XL) 50 MG 24 hr tablet Take 50 mg by mouth 2 (two) times daily. Take with or immediately following a meal.      . Vitamin D, Ergocalciferol, (DRISDOL) 50000 UNITS CAPS capsule Take 50,000 Units by mouth every 7 (seven) days.       No current  facility-administered medications for this visit.    Allergies:   No Known Allergies  Social History:  The patient  reports that she has never smoked. She has never used smokeless tobacco. She reports that she does not drink alcohol or use illicit drugs.   ROS:  Please see the history of present illness.   Denies any fevers, chills, orthopnea, PND, syncope  All other systems reviewed and negative.   PHYSICAL EXAM: VS:  Ht 5' (1.524 m)  Wt 116 lb (52.617 kg)  BMI 22.65 kg/m2 Well nourished, well developed, in no acute distress HEENT: normal Neck: no JVD Cardiac:  Irregularly irregular,normal rate; no murmur Lungs:  clear to auscultation bilaterally, no wheezing, rhonchi or rales Abd: soft, nontender, no hepatomegaly Ext: minimal edemaSkin: warm and dry Neuro: no focal abnormalities noted  EKG:   06/20/13: Atrial flutter/fibrillation heart rate 109, nonspecific ST-T wave changes, poor R wave progression, left axis deviation/prior inferior infarct pattern.   08/06/13-atrial flutter pattern with variable conduction, heart rate 91 beats per minute.   Echocardiogram: 9/14: - Left ventricle: The cavity size was normal. Wall thickness was normal. Systolic function was mildly to moderately reduced. The estimated ejection fraction was in the range of 40% to 45%. Diffuse hypokinesis. There was septal-lateral dyssynchrony. Features are consistent with a pseudonormal left ventricular filling pattern, with concomitant abnormal relaxation and increased filling pressure (grade 2 diastolic dysfunction). - Aortic valve: There was no stenosis. Mild regurgitation. - Mitral valve: Mild regurgitation. - Left atrium: The atrium was moderately dilated. - Right ventricle: The cavity size was normal. Systolic function was mildly reduced. - Right atrium: The atrium was moderately dilated. - Tricuspid valve: Peak RV-RA gradient:5728mm Hg (S). - Pulmonary arteries: PA peak pressure: 38mm Hg (S). -  Systemic veins: IVC measured 2.3 cm with some respirophasic variation, suggesting RA pressure 10 mmHg. Impressions:  - Normal LV size with EF 40-45%. Diffuse hypokinesis with septal-lateral dyssynchrony. Normal RV size with mildly decreased systolic function. Moderate biatrial enlargement. Mild pulmonary hypertension. Mild MR and AI.  ASSESSMENT AND PLAN:  1. Atrial flutter/fibrillation- Stopped amiodarone in the hospital since it was not maintaining any semblance of rhythm control. Metoprolol extended release 50 mg twice a day and diltiazem CD 120 mg once a day to her drug regimen as well. She has been on this in the past in June of 2014. I am aware of her previous bout of hypotension. Nonetheless, her heart rate still seems better controlled. I do not feel comfortable cardioverting her because we cannot place her on anticoagulation She is currently not on Coumadin because of intramural hematoma of aorta which expanded. Aspirin only. 81 mg. 2. Acute on chronic diastolic/systolic heart failure-ER visit from 08/01/13 reviewed. Lasix 40 mg twice a day was administered. Her weight is down approximately 6 pounds. I will continue with Lasix 40  mg twice a day for maintenance. I will check basic metabolic profile today. Her potassium was slightly low at previous hospitalization. Continue with potassium supplementation Aortic dissection repair/Bentall procedure-doing well. Prior note reviewed. No change. 3. Intramural hematoma of aorta. Unable to give Coumadin because of this. 4. Hypertension/hypotension-ACE inhibitor off. Limiting.Combined systolic/diastolic heart failure-EF 40-45%. Trying to rate control atrial flutter/fibrillation as best as possible. Unable to utilize ACE inhibitor because of hypotension. 5. Cough/hoarseness-interestingly, she did not have this immediately after the surgery she states. I spoke with Dr. Maren Beach who thinks that her expanding aortic intramural hematoma may have compressed  the recurrent laryngeal nerve causing hoarseness.    Signed, Donato Schultz, MD [facility]  09/22/2013 1:30 PM

## 2013-09-22 NOTE — Patient Instructions (Signed)
Your physician recommends that you continue on your current medications as directed. Please refer to the Current Medication list given to you today.  Your physician recommends that you schedule a follow-up appointment in: 2 months with Dr. Skains.  

## 2013-09-26 ENCOUNTER — Other Ambulatory Visit: Payer: Self-pay | Admitting: *Deleted

## 2013-09-26 DIAGNOSIS — I359 Nonrheumatic aortic valve disorder, unspecified: Secondary | ICD-10-CM

## 2013-10-01 ENCOUNTER — Ambulatory Visit: Payer: Commercial Managed Care - HMO | Admitting: Cardiothoracic Surgery

## 2013-10-10 ENCOUNTER — Other Ambulatory Visit: Payer: Self-pay

## 2013-10-10 MED ORDER — DIGOXIN 125 MCG PO TABS: 0.1250 mg | ORAL_TABLET | Freq: Every day | ORAL | Status: DC

## 2013-10-22 ENCOUNTER — Other Ambulatory Visit: Payer: Self-pay | Admitting: *Deleted

## 2013-10-22 DIAGNOSIS — I7100 Dissection of unspecified site of aorta: Secondary | ICD-10-CM

## 2013-10-22 DIAGNOSIS — I719 Aortic aneurysm of unspecified site, without rupture: Secondary | ICD-10-CM

## 2013-10-29 ENCOUNTER — Other Ambulatory Visit: Payer: Self-pay | Admitting: Cardiothoracic Surgery

## 2013-10-29 ENCOUNTER — Ambulatory Visit
Admission: RE | Admit: 2013-10-29 | Discharge: 2013-10-29 | Disposition: A | Discharge status: Home / Self Care | Payer: Commercial Managed Care - HMO | Source: Ambulatory Visit | Attending: Cardiothoracic Surgery | Admitting: Cardiothoracic Surgery

## 2013-10-29 ENCOUNTER — Encounter: Payer: Self-pay | Admitting: Cardiothoracic Surgery

## 2013-10-29 ENCOUNTER — Ambulatory Visit (INDEPENDENT_AMBULATORY_CARE_PROVIDER_SITE_OTHER): Payer: Commercial Managed Care - HMO | Admitting: Cardiothoracic Surgery

## 2013-10-29 VITALS — BP 125/86 | HR 65 | Resp 20 | Ht 60.0 in | Wt 116.0 lb

## 2013-10-29 DIAGNOSIS — I509 Heart failure, unspecified: Secondary | ICD-10-CM

## 2013-10-29 DIAGNOSIS — I719 Aortic aneurysm of unspecified site, without rupture: Secondary | ICD-10-CM

## 2013-10-29 DIAGNOSIS — I7100 Dissection of unspecified site of aorta: Secondary | ICD-10-CM

## 2013-10-29 DIAGNOSIS — Z9889 Other specified postprocedural states: Principal | ICD-10-CM

## 2013-10-29 DIAGNOSIS — Z8679 Personal history of other diseases of the circulatory system: Secondary | ICD-10-CM

## 2013-10-29 LAB — CREATININE, SERUM: Creat: 1.01 mg/dL (ref 0.50–1.10)

## 2013-10-29 LAB — BUN: BUN: 15 mg/dL (ref 6–23)

## 2013-10-29 NOTE — Progress Notes (Signed)
PCP is [name], [name], MD Referring Provider is [name], Mark, MD  Chief Complaint  Patient presents with  . Follow-up    1 month f/u with CXR    HPI: Routine followup after repair of type A ascending dissection. The patient has a patent false lumen distally in the proximal descending thoracic aorta which is stable at 6 cm. She is not a candidate for further surgery orTEVAR. She is off Coumadin because of risk of expansion of the false lumen. Right now her blood pressure is under good control, her atrial arrhythmias are well-controlled and her heart failure as well controlled on current medical management by Dr. Anne FuSkains.  The patient is still hoarse probably from traction on the left recurrent nerve from the distal arch aneurysm.   Past Medical History  Diagnosis Date  . Afib   . Aortic root aneurysm   . Hypertension   . CHF (congestive heart failure)   . Shortness of breath   . Aortic atherosclerosis 06/03/2013  . Hypokalemia 08/06/2013    Past Surgical History  Procedure Laterality Date  . Bentall procedure N/A 11/11/2012    Procedure: BENTALL PROCEDURE;  Surgeon: [name] PernaPeter Van Trigt, MD;  [facility];  Service: Open Heart Surgery;  Laterality: N/A;  *CIRC ARREST*   . Intraoperative transesophageal echocardiogram N/A 11/11/2012    Procedure: INTRAOPERATIVE TRANSESOPHAGEAL ECHOCARDIOGRAM;  Surgeon: [name] PernaPeter Van Trigt, MD;  [facility];  Service: Open Heart Surgery;  Laterality: N/A;  . Ascending aortic aneurysm repair  10/2012    Family History  Problem Relation Age of Onset  . Heart attack Neg Hx   . Heart disease Neg Hx   . Heart failure Neg Hx   . Hypertension Neg Hx     Social History History  Substance Use Topics  . Smoking status: Never Smoker   . Smokeless tobacco: Never Used  . Alcohol Use: No    Current Outpatient Prescriptions  Medication Sig Dispense Refill  . acetaminophen (TYLENOL) 325 MG tablet Take 650 mg by mouth every 6 (six) hours as needed.      [name].  aspirin 81 MG tablet Take 81 mg by mouth daily.      . digoxin (LANOXIN) 0.125 MG tablet Take 1 tablet (0.125 mg total) by mouth daily.  30 tablet  6  . diltiazem (CARDIZEM CD) 120 MG 24 hr capsule Take 120 mg by mouth daily.      . furosemide (LASIX) 40 MG tablet Take 1 tablet (40 mg total) by mouth 2 (two) times daily.  30 tablet  3  . KLOR-CON M20 20 MEQ tablet Take 20 mEq by mouth daily.       . metoprolol succinate (TOPROL-XL) 50 MG 24 hr tablet Take 50 mg by mouth 2 (two) times daily. Take with or immediately following a meal.       No current facility-administered medications for this visit.    No Known Allergies  Review of Systems breathing well no significant ankle edema no chest pains BP 125/86  Pulse 65  Resp 20  Ht 5' (1.524 m)  Wt 116 lb (52.617 kg)  BMI 22.65 kg/m2  SpO2 98% Physical Exam Alert and comfortable Lungs clear Heart rate slightly irregular, no murmur Trace pedal edema   Diagnostic Tests: Chest x-ray with dilated descending aortic shadow, stable No CHF or pleural effusion  Impression: Continue current medications Patient not a candidate for further surgical therapy of her thoracic aortic disease  Plan: Return for review in 3  months. Importance of compliance with her medications was stressed to the patient.

## 2013-11-03 ENCOUNTER — Other Ambulatory Visit: Payer: Self-pay | Admitting: *Deleted

## 2013-11-03 DIAGNOSIS — I509 Heart failure, unspecified: Secondary | ICD-10-CM

## 2013-11-03 MED ORDER — FUROSEMIDE 40 MG PO TABS: 40.0000 mg | ORAL_TABLET | Freq: Two times a day (BID) | ORAL | Status: DC

## 2013-11-24 ENCOUNTER — Encounter: Payer: Self-pay | Admitting: Cardiology

## 2013-11-24 ENCOUNTER — Ambulatory Visit (INDEPENDENT_AMBULATORY_CARE_PROVIDER_SITE_OTHER): Payer: Medicare HMO | Admitting: Cardiology

## 2013-11-24 VITALS — BP 118/70 | HR 76 | Ht 60.0 in | Wt 118.8 lb

## 2013-11-24 DIAGNOSIS — I4891 Unspecified atrial fibrillation: Secondary | ICD-10-CM

## 2013-11-24 DIAGNOSIS — I7100 Dissection of unspecified site of aorta: Secondary | ICD-10-CM

## 2013-11-24 DIAGNOSIS — I428 Other cardiomyopathies: Secondary | ICD-10-CM

## 2013-11-24 DIAGNOSIS — I429 Cardiomyopathy, unspecified: Secondary | ICD-10-CM

## 2013-11-24 DIAGNOSIS — I10 Essential (primary) hypertension: Secondary | ICD-10-CM

## 2013-11-24 MED ORDER — METOPROLOL SUCCINATE ER 50 MG PO TB24: 50.0000 mg | ORAL_TABLET | Freq: Every day | ORAL | Status: DC

## 2013-11-24 NOTE — Progress Notes (Signed)
[address] [place], [place]  [address] Phone: [phone] Fax:  [phone]  Date:  11/24/2013   ID:  [name], DOB March 13, 1928, MRN [id number]  PCP:  Farris Has, MD   History of Present Illness: [name] is a 80 y.o. female with chronic atrial fibrillation/flutter, Type A aortic dissection thoracic repair by Dr. 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  Unfortunately, she was hospitalized 2 days after her prior appointment on 08/09/13 secondary to increasing insomnia, trouble breathing at night/acute on chronic systolic heart failure. We utilized IV Lasix which seemed to help and she was currently comfortable after a daily dose. CT scan was negative for pulmonary and was him. Her atrial fibrillation was overall well rate controlled on Toprol, digoxin as well as Cardizem. Once again, not on anticoagulation because of intramural aortic hematoma. Amiodarone was discontinued because rhythm control did not seem to be plausible. No cardioversion because of inability to anticoagulate.   At previous visits, her blood pressure was on the low side. Her Toprol was cut significantly. She ended up having increase heart rate with her atrial fibrillation and I increased her Toprol once again back to 50 XL twice a day. Her heart rate is much improved previously but once again was increased diltiazem CD 120 mg was added. She denies any dizziness, syncope.   Dr. Maren Beach prescribed her sublingual nitroglycerin when necessary. Remember, unknown coronary artery status as she was not a candidate for cardiac catheterization at the time of repair. The patient has a patent false lumen distally in the proximal descending thoracic aorta which is stable at 6 cm. She is not a candidate for further surgery or  TEVAR. Overall she  is doing well currently. Her atrial arrhythmia/atrial fibrillation is well compensated, rate controlled. She states that she is actually only taking one of the metoprolol extended release 50 mg tablets a day. She was to go shopping, has more energy.     Wt Readings from Last 3 Encounters:  11/24/13 118 lb 12.8 oz (53.887 kg)  10/29/13 116 lb (52.617 kg)  09/22/13 116 lb (52.617 kg)     Past Medical History  Diagnosis Date  . Afib   . Aortic root aneurysm   . Hypertension   . CHF (congestive heart failure)   . Shortness of breath   . Aortic atherosclerosis 06/03/2013  . Hypokalemia 08/06/2013    Past Surgical History  Procedure Laterality Date  . Bentall procedure N/A 11/11/2012    Procedure: BENTALL PROCEDURE;  Surgeon: Kerin Perna, MD;  Location: [facility] OR;  Service: Open Heart Surgery;  Laterality: N/A;  *CIRC ARREST*   . Intraoperative transesophageal echocardiogram N/A 11/11/2012    Procedure: INTRAOPERATIVE TRANSESOPHAGEAL ECHOCARDIOGRAM;  Surgeon: Kerin Perna, MD;  Location[facility] OR;  Service: Open Heart Surgery;  Laterality: N/A;  . Ascending aortic aneurysm repair  10/2012    Current Outpatient Prescriptions  Medication Sig Dispense Refill  . acetaminophen (TYLENOL) 325 MG tablet Take 650 mg by mouth every 6 (six) hours as needed.      Marland Kitchen aspirin 81 MG tablet Take 81 mg by mouth daily.      . digoxin (LANOXIN) 0.125 MG tablet Take 1 tablet (0.125 mg total) by mouth daily.  30 tablet  6  . diltiazem (CARDIZEM CD) 120 MG 24 hr capsule  Take 120 mg by mouth daily.      . furosemide (LASIX) 40 MG tablet Take 1 tablet (40 mg total) by mouth 2 (two) times daily.  60 tablet  0  . KLOR-CON M20 20 MEQ tablet Take 20 mEq by mouth daily.       . metoprolol succinate (TOPROL-XL) 50 MG 24 hr tablet Take 50 mg by mouth 2 (two) times daily. Take with or immediately following a meal.       No current facility-administered medications for this visit.    Allergies:   No Known  Allergies  Social History:  The patient  reports that she has never smoked. She has never used smokeless tobacco. She reports that she does not drink alcohol or use illicit drugs.   ROS:  Please see the history of present illness.   Denies any fevers, chills, orthopnea, PND, syncope  All other systems reviewed and negative.   PHYSICAL EXAM: VS:  BP 118/70  Pulse 76  Ht 5' (1.524 m)  Wt 118 lb 12.8 oz (53.887 kg)  BMI 23.20 kg/m2 Well nourished, well developed, in no acute distress HEENT: normal Neck: no JVD Cardiac:  Irregularly irregular,normal rate; no murmur Lungs:  clear to auscultation bilaterally, no wheezing, rhonchi or rales Abd: soft, nontender, no hepatomegaly Ext: minimal edemaSkin: warm and dry Neuro: no focal abnormalities noted  EKG:   06/20/13: Atrial flutter/fibrillation heart rate 109, nonspecific ST-T wave changes, poor R wave progression, left axis deviation/prior inferior infarct pattern.   08/06/13-atrial flutter pattern with variable conduction, heart rate 91 beats per minute.   Echocardiogram: 9/14: - Left ventricle: The cavity size was normal. Wall thickness was normal. Systolic function was mildly to moderately reduced. The estimated ejection fraction was in the range of 40% to 45%. Diffuse hypokinesis. There was septal-lateral dyssynchrony. Features are consistent with a pseudonormal left ventricular filling pattern, with concomitant abnormal relaxation and increased filling pressure (grade 2 diastolic dysfunction). - Aortic valve: There was no stenosis. Mild regurgitation. - Mitral valve: Mild regurgitation. - Left atrium: The atrium was moderately dilated. - Right ventricle: The cavity size was normal. Systolic function was mildly reduced. - Right atrium: The atrium was moderately dilated. - Tricuspid valve: Peak RV-RA gradient:61mm Hg (S). - Pulmonary arteries: PA peak pressure: 38mm Hg (S). - Systemic veins: IVC measured 2.3 cm with  some respirophasic variation, suggesting RA pressure 10 mmHg. Impressions:  - Normal LV size with EF 40-45%. Diffuse hypokinesis with septal-lateral dyssynchrony. Normal RV size with mildly decreased systolic function. Moderate biatrial enlargement. Mild pulmonary hypertension. Mild MR and AI.  ASSESSMENT AND PLAN:  1. Atrial flutter/fibrillation- Stopped amiodarone in the hospital since it was not maintaining any semblance of rhythm control. Metoprolol extended release 50 mg once a day and diltiazem CD 120 mg once a day to her drug regimen as well. She has been on digoxin because of difficulty with rate control. I contemplated discontinuing this medication but since she is doing so well currently, we will continue. Aspirin only. 81 mg. 2. Acute on chronic diastolic/systolic heart failure-ER visit from 08/01/13 reviewed. Lasix 40 mg twice a day was administered. Her weight is down approximately 6 pounds. I will continue with Lasix 40 mg twice a day for maintenance. Continue with potassium supplementation. 3. Aortic dissection repair/Bentall procedure- Prior note reviewed. No change, stable. 4. Intramural hematoma of aorta. Unable to give Coumadin because of this. 5. Hypertension/hypotension-ACE inhibitor off. Limiting.Combined systolic/diastolic heart failure-EF 40-45%. Trying to rate control atrial flutter/fibrillation  as best as possible. Unable to utilize ACE inhibitor because of hypotension. 6. Cough/hoarseness-interestingly, she did not have this immediately after the surgery she states. I spoke with Dr. Maren BeachVanTrigt who that her expanded aortic intramural hematoma may have caused traction on the recurrent laryngeal nerve causing hoarseness. 7. 4 months follow up.    Signed, Donato SchultzMark Erma Raiche, MD [facility]  11/24/2013 11:45 AM

## 2013-11-24 NOTE — Patient Instructions (Signed)
Your physician has recommended you make the following change in your medication:   1. Decrease Metoprolol to 50mg  once daily.  Your physician wants you to follow-up in: 4 month with Dr. Anne Fu. You will receive a reminder letter in the mail two months in advance. If you don't receive a letter, please call our office to schedule the follow-up appointment.

## 2013-12-09 ENCOUNTER — Other Ambulatory Visit: Payer: Self-pay

## 2013-12-09 DIAGNOSIS — I509 Heart failure, unspecified: Secondary | ICD-10-CM

## 2013-12-09 MED ORDER — FUROSEMIDE 40 MG PO TABS: 40.0000 mg | ORAL_TABLET | Freq: Two times a day (BID) | ORAL | Status: DC

## 2014-02-04 ENCOUNTER — Ambulatory Visit: Payer: Commercial Managed Care - HMO | Admitting: Cardiothoracic Surgery

## 2014-02-24 ENCOUNTER — Other Ambulatory Visit: Payer: Self-pay | Admitting: *Deleted

## 2014-02-24 MED ORDER — METOPROLOL SUCCINATE ER 50 MG PO TB24: 50.0000 mg | ORAL_TABLET | Freq: Every day | ORAL | Status: DC

## 2014-02-25 ENCOUNTER — Ambulatory Visit
Admission: RE | Admit: 2014-02-25 | Discharge: 2014-02-25 | Disposition: A | Discharge status: Home / Self Care | Payer: Commercial Managed Care - HMO | Source: Ambulatory Visit | Attending: Cardiothoracic Surgery | Admitting: Cardiothoracic Surgery

## 2014-02-25 ENCOUNTER — Ambulatory Visit (INDEPENDENT_AMBULATORY_CARE_PROVIDER_SITE_OTHER): Payer: Commercial Managed Care - HMO | Admitting: Cardiothoracic Surgery

## 2014-02-25 ENCOUNTER — Other Ambulatory Visit: Payer: Self-pay | Admitting: *Deleted

## 2014-02-25 ENCOUNTER — Encounter: Payer: Self-pay | Admitting: Cardiothoracic Surgery

## 2014-02-25 VITALS — BP 124/79 | HR 50 | Resp 16 | Ht 60.0 in | Wt 118.0 lb

## 2014-02-25 DIAGNOSIS — I71019 Dissection of thoracic aorta, unspecified: Secondary | ICD-10-CM

## 2014-02-25 DIAGNOSIS — R609 Edema, unspecified: Secondary | ICD-10-CM

## 2014-02-25 DIAGNOSIS — I7100 Dissection of unspecified site of aorta: Secondary | ICD-10-CM

## 2014-02-25 DIAGNOSIS — Z09 Encounter for follow-up examination after completed treatment for conditions other than malignant neoplasm: Secondary | ICD-10-CM

## 2014-02-25 DIAGNOSIS — I359 Nonrheumatic aortic valve disorder, unspecified: Secondary | ICD-10-CM

## 2014-02-25 DIAGNOSIS — I7101 Dissection of thoracic aorta: Secondary | ICD-10-CM

## 2014-02-25 MED ORDER — FUROSEMIDE 20 MG PO TABS: 20.0000 mg | ORAL_TABLET | Freq: Every day | ORAL | Status: DC

## 2014-02-25 NOTE — Progress Notes (Signed)
PCP is Farris Has, MD Referring Provider is Donato Schultz, MD  Chief Complaint  Patient presents with  . Routine Post Op    3 month f/u with cxr    HPI: 6 month followup-patient stable and doing well. She had emergency repair of type A aortic dissection March 2014. She had postop atrial fibrillation. 3 months after surgery she was placed on Coumadin and sustained a extension of the false lumen. The Coumadin has been stopped. Her transverse arch measures 4.3 cm  and her proximal descending thoracic aorta measures 5 cm. Last echo shows EF 45% without AI. The patient has hoarseness from expansion of the false lumen following Coumadin with stretch of the left recurrent laryngeal nerve. The hoarseness is more pronounced late in the day. She denies any difficulty swallowing.  Patient has rapid atrial fibrillation medically controlled and followed by Dr. Anne Fu. Past Medical History  Diagnosis Date  . Afib   . Aortic root aneurysm   . Hypertension   . CHF (congestive heart failure)   . Shortness of breath   . Aortic atherosclerosis 06/03/2013  . Hypokalemia 08/06/2013    Past Surgical History  Procedure Laterality Date  . Bentall procedure N/A 11/11/2012    Procedure: BENTALL PROCEDURE;  Surgeon: Kerin Perna, MD;  Location: [facility] OR;  Service: Open Heart Surgery;  Laterality: N/A;  *CIRC ARREST*   . Intraoperative transesophageal echocardiogram N/A 11/11/2012    Procedure: INTRAOPERATIVE TRANSESOPHAGEAL ECHOCARDIOGRAM;  Surgeon: Kerin Perna, MD;  Location: [facility] OR;  Service: Open Heart Surgery;  Laterality: N/A;  . Ascending aortic aneurysm repair  10/2012    Family History  Problem Relation Age of Onset  . Heart attack Neg Hx   . Heart disease Neg Hx   . Heart failure Neg Hx   . Hypertension Neg Hx     Social History History  Substance Use Topics  . Smoking status: Never Smoker   . Smokeless tobacco: Never Used  . Alcohol Use: No    Current Outpatient Prescriptions   Medication Sig Dispense Refill  . acetaminophen (TYLENOL) 325 MG tablet Take 650 mg by mouth every 6 (six) hours as needed.      Marland Kitchen aspirin 81 MG tablet Take 81 mg by mouth daily.      . digoxin (LANOXIN) 0.125 MG tablet Take 1 tablet (0.125 mg total) by mouth daily.  30 tablet  6  . diltiazem (CARDIZEM CD) 120 MG 24 hr capsule Take 120 mg by mouth daily.      . furosemide (LASIX) 40 MG tablet Take 1 tablet (40 mg total) by mouth 2 (two) times daily.  60 tablet  3  . KLOR-CON M20 20 MEQ tablet Take 20 mEq by mouth daily.       . metoprolol succinate (TOPROL-XL) 50 MG 24 hr tablet Take 50 mg by mouth 2 (two) times daily. Take with or immediately following a meal.       No current facility-administered medications for this visit.    No Known Allergies  Review of Systems patient denies chest pain or shortness of breath. She has lost 10-15 pounds on purpose.  BP 124/79  Pulse 50  Resp 16  Ht 5' (1.524 m)  Wt 118 lb (53.524 kg)  BMI 23.05 kg/m2  SpO2 96% Physical Exam Alert and pleasant Voice is hoarse Neck without JVD, good carotid pulses Heart rate irregular Lungs clear No cardiac murmur Minimal pedal edema Good peripheral pulses  Diagnostic Tests: Chest x-ray shows  no change in the mediastinal contour from the dilated descending thoracic aorta  Impression: Continue medical therapy for her thoracic aortic disease-blood pressure control Patient doing well. The patient is not a candidate for further thoracic aortic surgery because of her advanced age   Plan return with chest x-ray in 6 months and continue current meds:

## 2014-02-26 ENCOUNTER — Other Ambulatory Visit: Payer: Self-pay

## 2014-02-26 MED ORDER — METOPROLOL SUCCINATE ER 50 MG PO TB24: 50.0000 mg | ORAL_TABLET | Freq: Two times a day (BID) | ORAL | Status: DC

## 2014-04-03 ENCOUNTER — Other Ambulatory Visit: Payer: Self-pay

## 2014-04-03 MED ORDER — METOPROLOL SUCCINATE ER 50 MG PO TB24: 50.0000 mg | ORAL_TABLET | Freq: Two times a day (BID) | ORAL | Status: DC

## 2014-04-11 ENCOUNTER — Other Ambulatory Visit: Payer: Self-pay | Admitting: Cardiology

## 2014-05-07 ENCOUNTER — Other Ambulatory Visit: Payer: Self-pay | Admitting: Cardiology

## 2014-05-08 ENCOUNTER — Other Ambulatory Visit: Payer: Self-pay

## 2014-05-08 ENCOUNTER — Other Ambulatory Visit: Payer: Self-pay | Admitting: Cardiology

## 2014-05-08 MED ORDER — DIGOXIN 125 MCG PO TABS: 0.1250 mg | ORAL_TABLET | Freq: Every day | ORAL | Status: DC

## 2014-07-12 ENCOUNTER — Other Ambulatory Visit: Payer: Self-pay | Admitting: Cardiology

## 2014-08-31 ENCOUNTER — Other Ambulatory Visit: Payer: Self-pay | Admitting: Cardiothoracic Surgery

## 2014-08-31 DIAGNOSIS — I7121 Aneurysm of the ascending aorta, without rupture: Secondary | ICD-10-CM

## 2014-08-31 DIAGNOSIS — I719 Aortic aneurysm of unspecified site, without rupture: Secondary | ICD-10-CM

## 2014-09-02 ENCOUNTER — Other Ambulatory Visit: Payer: Self-pay | Admitting: Cardiothoracic Surgery

## 2014-09-02 ENCOUNTER — Encounter: Payer: Self-pay | Admitting: Cardiothoracic Surgery

## 2014-09-02 ENCOUNTER — Ambulatory Visit (INDEPENDENT_AMBULATORY_CARE_PROVIDER_SITE_OTHER): Payer: Commercial Managed Care - HMO | Admitting: Cardiothoracic Surgery

## 2014-09-02 ENCOUNTER — Ambulatory Visit
Admission: RE | Admit: 2014-09-02 | Discharge: 2014-09-02 | Disposition: A | Discharge status: Home / Self Care | Payer: Commercial Managed Care - HMO | Source: Ambulatory Visit | Attending: Cardiothoracic Surgery | Admitting: Cardiothoracic Surgery

## 2014-09-02 VITALS — BP 128/78 | HR 104 | Resp 16 | Ht 60.0 in | Wt 118.0 lb

## 2014-09-02 DIAGNOSIS — I7101 Dissection of thoracic aorta: Secondary | ICD-10-CM | POA: Diagnosis not present

## 2014-09-02 DIAGNOSIS — I719 Aortic aneurysm of unspecified site, without rupture: Secondary | ICD-10-CM | POA: Diagnosis not present

## 2014-09-02 DIAGNOSIS — I429 Cardiomyopathy, unspecified: Secondary | ICD-10-CM

## 2014-09-02 DIAGNOSIS — Z9889 Other specified postprocedural states: Secondary | ICD-10-CM

## 2014-09-02 DIAGNOSIS — I71019 Dissection of thoracic aorta, unspecified: Secondary | ICD-10-CM

## 2014-09-02 DIAGNOSIS — I7121 Aneurysm of the ascending aorta, without rupture: Secondary | ICD-10-CM

## 2014-09-02 NOTE — Progress Notes (Signed)
PCP is Farris Has, MD Referring Provider is Donato Schultz, MD  Chief Complaint  Patient presents with  . Routine Post Op    6 month f/u with a cxr    HPI:2 year followupafter repair of a type A ascending thoracic aortic dissectionwith a straight graft and resuspension of the aortic valve. No neurologic deficits following surgery. The patient did have atrial fibrillation in 3 months postop was started on Coumadin. At that point she developed a type B dissection with progression in size of the false lumen in  the descending thoracic aorta. Her Coumadin was stopped.  Since then she is very slowly but gradually shown improvement. Her ejection fraction has improved to a 45%. Aortic valve is not leaking. She's remains in atrial fibrillation which is rate controlled with beta blocker, Cardizem, and digoxin. She has lost weight but is still fairly active and recently traveled to [place] in the chart or bus to visit her daughter. She has no active symptoms of angina or heart failure. She's never had a cardiac catheterization to define her coronary anatomy. She did have some hoarseness associated with extension of the dissection probably related to stretching the recurrent nerve from the dilated distal arch but this is now improved as well.   Past Medical History  Diagnosis Date  . Afib   . Aortic root aneurysm   . Hypertension   . CHF (congestive heart failure)   . Shortness of breath   . Aortic atherosclerosis 06/03/2013  . Hypokalemia 08/06/2013    Past Surgical History  Procedure Laterality Date  . Bentall procedure N/A 11/11/2012    Procedure: BENTALL PROCEDURE;  Surgeon: Kerin Perna, MD;  Location: [facility] OR;  Service: Open Heart Surgery;  Laterality: N/A;  *CIRC ARREST*   . Intraoperative transesophageal echocardiogram N/A 11/11/2012    Procedure: INTRAOPERATIVE TRANSESOPHAGEAL ECHOCARDIOGRAM;  Surgeon: Kerin Perna, MD;  Location: [facility] OR;  Service: Open Heart Surgery;  Laterality: N/A;   . Ascending aortic aneurysm repair  10/2012    Family History  Problem Relation Age of Onset  . Heart attack Neg Hx   . Heart disease Neg Hx   . Heart failure Neg Hx   . Hypertension Neg Hx     Social History History  Substance Use Topics  . Smoking status: Never Smoker   . Smokeless tobacco: Never Used  . Alcohol Use: No    Current Outpatient Prescriptions  Medication Sig Dispense Refill  . acetaminophen (TYLENOL) 325 MG tablet Take 650 mg by mouth every 6 (six) hours as needed.    Marland Kitchen aspirin 81 MG tablet Take 81 mg by mouth daily.    . digoxin (LANOXIN) 0.125 MG tablet Take 1 tablet (0.125 mg total) by mouth daily. 30 tablet 6  . diltiazem (CARDIZEM CD) 120 MG 24 hr capsule Take 120 mg by mouth daily.    . furosemide (LASIX) 40 MG tablet TAKE 1 TABLET TWICE A DAY 60 tablet 0  . KLOR-CON M20 20 MEQ tablet Take 20 mEq by mouth daily.     . metoprolol succinate (TOPROL-XL) 50 MG 24 hr tablet Take 1 tablet (50 mg total) by mouth 2 (two) times daily. Take with or immediately following a meal. 60 tablet 3   No current facility-administered medications for this visit.    No Known Allergies  Review of Systems   Sleeping well leaving well Able to walk without shortness of breath or chest pain No pedal edema No falls No TIA or stroke  symptoms  BP 128/78 mmHg  Pulse 104  Resp 16  Ht 5' (1.524 m)  Wt 118 lb (53.524 kg)  BMI 23.05 kg/m2  SpO2 98% Physical Exam Alert and pleasant, accompanied by daughter HEENT normocephalic pupils equal conjunctiva pink Neck without JVD, good carotid pulses bilaterally Heart rhythm regular, heart rate elevated 110-115, no murmur Abdomen soft without pulsatile mass,  Extremities without edema  no focal motor deficit  Diagnostic Tests: Chest x-ray today is pending  Impression: Doing very well now almost 2 years after emergency repair of type a ascending thoracic dissection  Plan:continue medical therapy of her A. Fib, hypertension,  possible coronary disease,--she is not a candidate for further surgery of her type B dissection or treatment with TEVAR  Return for followup in 6 months

## 2014-09-07 ENCOUNTER — Ambulatory Visit: Payer: Commercial Managed Care - HMO | Admitting: Cardiology

## 2014-09-23 ENCOUNTER — Encounter: Payer: Self-pay | Admitting: Cardiology

## 2014-09-30 DIAGNOSIS — I77810 Thoracic aortic ectasia: Secondary | ICD-10-CM | POA: Diagnosis not present

## 2014-09-30 DIAGNOSIS — N390 Urinary tract infection, site not specified: Secondary | ICD-10-CM | POA: Diagnosis not present

## 2014-09-30 DIAGNOSIS — I272 Other secondary pulmonary hypertension: Secondary | ICD-10-CM | POA: Diagnosis not present

## 2014-09-30 DIAGNOSIS — I481 Persistent atrial fibrillation: Secondary | ICD-10-CM | POA: Diagnosis not present

## 2014-09-30 DIAGNOSIS — Z79899 Other long term (current) drug therapy: Secondary | ICD-10-CM | POA: Diagnosis not present

## 2014-09-30 DIAGNOSIS — R42 Dizziness and giddiness: Secondary | ICD-10-CM | POA: Diagnosis not present

## 2014-09-30 DIAGNOSIS — W19XXXA Unspecified fall, initial encounter: Secondary | ICD-10-CM | POA: Diagnosis not present

## 2014-09-30 DIAGNOSIS — I5022 Chronic systolic (congestive) heart failure: Secondary | ICD-10-CM | POA: Diagnosis not present

## 2014-09-30 DIAGNOSIS — I7100 Dissection of unspecified site of aorta: Secondary | ICD-10-CM | POA: Diagnosis not present

## 2014-09-30 DIAGNOSIS — I10 Essential (primary) hypertension: Secondary | ICD-10-CM | POA: Diagnosis not present

## 2014-10-01 ENCOUNTER — Encounter: Payer: Self-pay | Admitting: Cardiology

## 2014-10-01 ENCOUNTER — Ambulatory Visit (INDEPENDENT_AMBULATORY_CARE_PROVIDER_SITE_OTHER): Payer: Commercial Managed Care - HMO | Admitting: Cardiology

## 2014-10-01 VITALS — BP 118/80 | HR 108 | Ht 60.0 in | Wt 116.0 lb

## 2014-10-01 DIAGNOSIS — I7100 Dissection of unspecified site of aorta: Secondary | ICD-10-CM

## 2014-10-01 DIAGNOSIS — I481 Persistent atrial fibrillation: Secondary | ICD-10-CM

## 2014-10-01 DIAGNOSIS — I429 Cardiomyopathy, unspecified: Secondary | ICD-10-CM | POA: Diagnosis not present

## 2014-10-01 DIAGNOSIS — I10 Essential (primary) hypertension: Secondary | ICD-10-CM | POA: Diagnosis not present

## 2014-10-01 DIAGNOSIS — I4819 Other persistent atrial fibrillation: Secondary | ICD-10-CM

## 2014-10-01 NOTE — Progress Notes (Signed)
[address] [place], [place]  [address] Phone: [phone] Fax:  [phone]  Date:  10/01/2014   ID:  [name], DOB 08/25/1927, MRN [id number]  PCP:  Farris Has, MD   History of Present Illness: [name] is a 82 y.o. female with chronic atrial fibrillation/flutter, Type A aortic dissection thoracic repair by Dr. 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  Unfortunately, she was hospitalized 2 days after her prior appointment on 08/09/13 secondary to increasing insomnia, trouble breathing at night/acute on chronic systolic heart failure. We utilized IV Lasix which seemed to help and she was currently comfortable after a daily dose. CT scan was negative for pulmonary and was him. Her atrial fibrillation was overall well rate controlled on Toprol, digoxin as well as Cardizem. Once again, not on anticoagulation because of intramural aortic hematoma. Amiodarone was discontinued because rhythm control did not seem to be plausible. No cardioversion because of inability to anticoagulate.   At previous visits, her blood pressure was on the low side. Her Toprol was cut significantly. She ended up having increase heart rate with her atrial fibrillation and I increased her Toprol once again back to 50 XL twice a day. Her heart rate is much improved previously but once again was increased diltiazem CD 120 mg was added. She denies any dizziness, syncope.   Dr. Maren Beach prescribed her sublingual nitroglycerin when necessary. Remember, unknown coronary artery status as she was not a candidate for cardiac catheterization at the time of repair. The patient has a patent false lumen distally in the proximal descending thoracic aorta which is stable at 6 cm. She is not a candidate for further surgery or  TEVAR. Overall she  is doing well currently. Her atrial arrhythmia/atrial fibrillation is well compensated, rate controlled. She states that she is actually only taking one of the metoprolol extended release 50 mg tablets a day. She was to go shopping, has more energy.  10/01/14-overall doing very well. Occasionally she will have dizziness. She had an episode where after she got female, sat down and felt the room spinning. She feels well today. She has taken bus to [place] to visit family. Four grandchildren. Her hoarseness has improved.    Wt Readings from Last 3 Encounters:  10/01/14 116 lb (52.617 kg)  09/02/14 118 lb (53.524 kg)  02/25/14 118 lb (53.524 kg)     Past Medical History  Diagnosis Date  . Afib   . Aortic root aneurysm   . Hypertension   . CHF (congestive heart failure)   . Shortness of breath   . Aortic atherosclerosis 06/03/2013  . Hypokalemia 08/06/2013    Past Surgical History  Procedure Laterality Date  . Bentall procedure N/A 11/11/2012    Procedure: BENTALL PROCEDURE;  Surgeon: Kerin Perna, MD;  Location: [facility] OR;  Service: Open Heart Surgery;  Laterality: N/A;  *CIRC ARREST*   . Intraoperative transesophageal echocardiogram N/A 11/11/2012    Procedure: INTRAOPERATIVE TRANSESOPHAGEAL ECHOCARDIOGRAM;  Surgeon: Kerin Perna, MD;  Location: [facility] OR;  Service: Open Heart Surgery;  Laterality: N/A;  . Ascending aortic aneurysm repair  10/2012    Current Outpatient Prescriptions  Medication Sig Dispense Refill  . acetaminophen (TYLENOL) 325 MG tablet Take 650 mg by mouth every 6 (six) hours as needed.    Marland Kitchen aspirin 81 MG tablet Take  81 mg by mouth daily.    . digoxin (LANOXIN) 0.125 MG tablet Take 1 tablet (0.125 mg total) by mouth daily. 30 tablet 6  . diltiazem (CARDIZEM CD) 120 MG 24 hr capsule Take 120 mg by mouth daily.    . furosemide (LASIX) 40 MG tablet TAKE 1 TABLET TWICE A DAY 60 tablet 0  . KLOR-CON M20 20 MEQ tablet Take 20 mEq by mouth daily.     . metoprolol succinate  (TOPROL-XL) 50 MG 24 hr tablet Take 1 tablet (50 mg total) by mouth 2 (two) times daily. Take with or immediately following a meal. 60 tablet 3   No current facility-administered medications for this visit.    Allergies:   No Known Allergies  Social History:  The patient  reports that she has never smoked. She has never used smokeless tobacco. She reports that she does not drink alcohol or use illicit drugs.   ROS:  Please see the history of present illness.   Denies any fevers, chills, orthopnea, PND, syncope  All other systems reviewed and negative.   PHYSICAL EXAM: VS:  BP 118/80 mmHg  Pulse 108  Ht 5' (1.524 m)  Wt 116 lb (52.617 kg)  BMI 22.65 kg/m2 Well nourished, well developed, in no acute distress HEENT: normal Neck: no JVD Cardiac:  Irregularly irregular; no murmur Lungs:  clear to auscultation bilaterally, no wheezing, rhonchi or rales Abd: soft, nontender, no hepatomegaly Ext: minimal edemaSkin: warm and dry Neuro: no focal abnormalities noted  EKG:   06/20/13: Atrial flutter/fibrillation heart rate 109, nonspecific ST-T wave changes, poor R wave progression, left axis deviation/prior inferior infarct pattern.   08/06/13-atrial flutter pattern with variable conduction, heart rate 91 beats per minute.  10/01/14-atrial fibrillation heart rate 108, left axis deviation, old inferior infarct.  Echocardiogram: 9/14: - Left ventricle: The cavity size was normal. Wall thickness was normal. Systolic function was mildly to moderately reduced. The estimated ejection fraction was in the range of 40% to 45%. Diffuse hypokinesis. There was septal-lateral dyssynchrony. Features are consistent with a pseudonormal left ventricular filling pattern, with concomitant abnormal relaxation and increased filling pressure (grade 2 diastolic dysfunction). - Aortic valve: There was no stenosis. Mild regurgitation. - Mitral valve: Mild regurgitation. - Left atrium: The atrium was moderately  dilated. - Right ventricle: The cavity size was normal. Systolic function was mildly reduced. - Right atrium: The atrium was moderately dilated. - Tricuspid valve: Peak RV-RA gradient:68mm Hg (S). - Pulmonary arteries: PA peak pressure: 38mm Hg (S). - Systemic veins: IVC measured 2.3 cm with some respirophasic variation, suggesting RA pressure 10 mmHg. Impressions:  - Normal LV size with EF 40-45%. Diffuse hypokinesis with septal-lateral dyssynchrony. Normal RV size with mildly decreased systolic function. Moderate biatrial enlargement. Mild pulmonary hypertension. Mild MR and AI.  ASSESSMENT AND PLAN:  1. Atrial flutter/fibrillation- Stopped amiodarone in the hospital since it was not maintaining any semblance of rhythm control. Metoprolol extended release 50 mg once a day and diltiazem CD 120 mg once a day to her drug regimen as well. She has been on digoxin because of difficulty with rate control. I contemplated discontinuing this medication but since she is doing so well currently, we will continue since she still has some tachycardia. Aspirin only. 81 mg. Heart rate today is 108 bpm. Still somewhat challenging to rate control.  2. Acute on chronic diastolic/systolic heart failure-ER visit from 08/01/13 reviewed. Lasix 40 mg twice a day was administered. Her weight is down approximately 6 pounds.  I will continue with Lasix 40 mg twice a day for maintenance. Continue with potassium supplementation. 3. Aortic dissection repair/Bentall procedure- Prior note reviewed. No change, stable. 4. Intramural hematoma of aorta. Unable to give Coumadin because of this. She has been following with Dr. Maren Beach 5. Hypertension/hypotension-ACE inhibitor off. Limiting.Combined systolic/diastolic heart failure-EF 40-45%. Trying to rate control atrial flutter/fibrillation as best as possible. Unable to utilize ACE inhibitor because of hypotension. 6. Cough/hoarseness-interestingly, she did not have this  immediately after the surgery she states. I spoke with Dr. Maren Beach who that her expanded aortic intramural hematoma may have caused traction on the recurrent laryngeal nerve causing hoarseness. She has improved.  7. 4 months follow up.    Signed, Donato Schultz, MD [facility]  10/01/2014 4:05 PM

## 2014-10-01 NOTE — Patient Instructions (Signed)
The current medical regimen is effective;  continue present plan and medications.  Follow up in 4 months with Dr Skains.  Thank you for choosing [facility]!!     

## 2014-10-03 ENCOUNTER — Other Ambulatory Visit: Payer: Self-pay | Admitting: Cardiology

## 2014-10-16 ENCOUNTER — Other Ambulatory Visit: Payer: Self-pay | Admitting: *Deleted

## 2014-10-16 MED ORDER — METOPROLOL SUCCINATE ER 50 MG PO TB24: 50.0000 mg | ORAL_TABLET | Freq: Two times a day (BID) | ORAL | Status: DC

## 2014-10-27 ENCOUNTER — Ambulatory Visit: Payer: Commercial Managed Care - HMO | Admitting: Cardiology

## 2014-11-03 ENCOUNTER — Other Ambulatory Visit: Payer: Self-pay | Admitting: *Deleted

## 2014-11-03 NOTE — Telephone Encounter (Signed)
Unless another MD has changed pt's Potassium dose I only see 20 MEQ a day

## 2014-11-03 NOTE — Telephone Encounter (Signed)
Pharmacy is requesting qd, but patients chart indicates that the patient should be taking qd. I do not see where it was changed. Please advise as to what current therapy should be. Thanks, MI

## 2014-11-04 MED ORDER — POTASSIUM CHLORIDE CRYS ER 20 MEQ PO TBCR: 20.0000 meq | EXTENDED_RELEASE_TABLET | Freq: Every day | ORAL | Status: DC

## 2014-11-19 DIAGNOSIS — R69 Illness, unspecified: Secondary | ICD-10-CM | POA: Diagnosis not present

## 2014-12-31 ENCOUNTER — Other Ambulatory Visit: Payer: Self-pay | Admitting: Cardiology

## 2015-01-27 ENCOUNTER — Ambulatory Visit: Payer: Commercial Managed Care - HMO | Admitting: Cardiology

## 2015-02-19 ENCOUNTER — Other Ambulatory Visit: Payer: Self-pay | Admitting: Cardiology

## 2015-03-10 ENCOUNTER — Ambulatory Visit: Payer: Commercial Managed Care - HMO | Admitting: Cardiothoracic Surgery

## 2015-03-29 ENCOUNTER — Other Ambulatory Visit: Payer: Self-pay | Admitting: Cardiothoracic Surgery

## 2015-04-02 ENCOUNTER — Other Ambulatory Visit: Payer: Self-pay | Admitting: Cardiothoracic Surgery

## 2015-04-02 ENCOUNTER — Other Ambulatory Visit: Payer: Self-pay | Admitting: Cardiology

## 2015-04-02 NOTE — Telephone Encounter (Signed)
I do not see where you have ever refilled this for the patient. Ok to fill? Please advise. Thanks, MI

## 2015-04-06 ENCOUNTER — Other Ambulatory Visit: Payer: Self-pay | Admitting: *Deleted

## 2015-04-06 NOTE — Telephone Encounter (Signed)
Error --- pending order

## 2015-04-07 NOTE — Telephone Encounter (Signed)
Pt must be seen by Dr Anne Fu (cancelled 6/16 appt) or obtain further refills from PCP

## 2015-04-28 ENCOUNTER — Other Ambulatory Visit: Payer: Self-pay | Admitting: Cardiology

## 2015-04-29 ENCOUNTER — Other Ambulatory Visit: Payer: Self-pay | Admitting: Cardiology

## 2015-05-09 ENCOUNTER — Other Ambulatory Visit: Payer: Self-pay | Admitting: Cardiology

## 2015-05-12 ENCOUNTER — Ambulatory Visit (INDEPENDENT_AMBULATORY_CARE_PROVIDER_SITE_OTHER): Payer: Commercial Managed Care - HMO | Admitting: Cardiothoracic Surgery

## 2015-05-12 ENCOUNTER — Encounter: Payer: Self-pay | Admitting: Cardiothoracic Surgery

## 2015-05-12 VITALS — BP 143/91 | HR 77 | Resp 16 | Ht 60.0 in | Wt 116.0 lb

## 2015-05-12 DIAGNOSIS — Z9889 Other specified postprocedural states: Secondary | ICD-10-CM | POA: Diagnosis not present

## 2015-05-12 DIAGNOSIS — I7100 Dissection of unspecified site of aorta: Secondary | ICD-10-CM | POA: Diagnosis not present

## 2015-05-12 DIAGNOSIS — I719 Aortic aneurysm of unspecified site, without rupture: Secondary | ICD-10-CM

## 2015-05-16 NOTE — Progress Notes (Signed)
PCP is Farris Has, MD Referring Provider is Jake Bathe, MD  Chief Complaint  Patient presents with  . Follow-up    6 month f/u     [id number] followup after repair of Stanford type A aortic dissection with biologic Bentall procedure 2014  Persistent patency of both lumen with aneurysmal dilatation of distal arch and proximal descending aorta managed medically-patient not a candidate for redo aortic surgery or stent graft  Atrial fibrillation but off all anticoagulation because of risk of further bleeding from Stanford type B dissection  The patient continues to do well without pain. Her hoarseness is somewhat improved. Blood pressure is well-controlled. She has lost significant weight and has no edema. No symptoms of angina or heart failure.  Past Medical History  Diagnosis Date  . Afib   . Aortic root aneurysm   . Hypertension   . CHF (congestive heart failure)   . Shortness of breath   . Aortic atherosclerosis 06/03/2013  . Hypokalemia 08/06/2013    Past Surgical History  Procedure Laterality Date  . Bentall procedure N/A 11/11/2012    Procedure: BENTALL PROCEDURE;  Surgeon: Kerin Perna, MD;  Location: [facility] OR;  Service: Open Heart Surgery;  Laterality: N/A;  *CIRC ARREST*   . Intraoperative transesophageal echocardiogram N/A 11/11/2012    Procedure: INTRAOPERATIVE TRANSESOPHAGEAL ECHOCARDIOGRAM;  Surgeon: Kerin Perna, MD;  Location: [facility] OR;  Service: Open Heart Surgery;  Laterality: N/A;  . Ascending aortic aneurysm repair  10/2012    Family History  Problem Relation Age of Onset  . Heart attack Neg Hx   . Heart disease Neg Hx   . Heart failure Neg Hx   . Hypertension Neg Hx     Social History Social History  Substance Use Topics  . Smoking status: Never Smoker   . Smokeless tobacco: Never Used  . Alcohol Use: No    Current Outpatient Prescriptions  Medication Sig Dispense Refill  . acetaminophen (TYLENOL) 325 MG tablet Take 650 mg by mouth every 6  (six) hours as needed.    Marland Kitchen aspirin 81 MG tablet Take 81 mg by mouth daily.    . digoxin (LANOXIN) 0.125 MG tablet TAKE 1 TABLET BY MOUTH EVERY DAY 30 tablet 0  . diltiazem (CARDIZEM CD) 120 MG 24 hr capsule TAKE ONE CAPSULE EVERY DAY 30 capsule 2  . furosemide (LASIX) 40 MG tablet TAKE 1 TABLET TWICE A DAY 60 tablet 3  . metoprolol succinate (TOPROL-XL) 50 MG 24 hr tablet TAKE 1 TABLET BY MOUTH TWICE A DAY WITH OR IMMEDIATELY FOLLOWING A MEAL 60 tablet 0  . potassium chloride SA (KLOR-CON M20) 20 MEQ tablet Take 1 tablet (20 mEq total) by mouth daily. 30 tablet 3   No current facility-administered medications for this visit.    No Known Allergies  Review of Systems  Patient recently had a trip out of state to visit family and did well She lives with her daughter and is able to do her ADLs  BP 143/91 mmHg  Pulse 77  Resp 16  Ht 5' (1.524 m)  Wt 116 lb (52.617 kg)  BMI 22.65 kg/m2  SpO2 96% Physical Exam Alert and pleasant appears healthy and looks great Lungs clear Heart rate irregular without murmur Peripheral pulses intact No significant pedal edema No focal neuro deficit  Diagnostic Tests: none  Impression: Patient continues to do well blood pressure monitoring, controlled atrial fibrillation, and avoidance of anticoagulation.  She should take 81 mg aspirin Monday Wednesday Friday.  She has never been cathed and her coronary status is unknown. Atrial fibrillation rate is controlled with Cardizem and digoxin.  Plan return for followup in 6 months.continue medical management of thoracic aortic disease. Not a candidate for further surgery in any circumstance.   Mikey Bussing, MD [facility] [phone]

## 2015-06-04 ENCOUNTER — Other Ambulatory Visit: Payer: Self-pay | Admitting: Cardiology

## 2015-06-17 ENCOUNTER — Other Ambulatory Visit: Payer: Self-pay | Admitting: Cardiology

## 2015-06-25 ENCOUNTER — Other Ambulatory Visit: Payer: Self-pay | Admitting: Cardiology

## 2015-06-28 ENCOUNTER — Other Ambulatory Visit: Payer: Self-pay | Admitting: Cardiology

## 2015-07-02 ENCOUNTER — Other Ambulatory Visit: Payer: Self-pay | Admitting: Cardiology

## 2015-07-06 ENCOUNTER — Encounter: Payer: Self-pay | Admitting: Physician Assistant

## 2015-07-06 ENCOUNTER — Ambulatory Visit (INDEPENDENT_AMBULATORY_CARE_PROVIDER_SITE_OTHER): Payer: Commercial Managed Care - HMO | Admitting: Physician Assistant

## 2015-07-06 VITALS — BP 140/90 | HR 110 | Ht 60.0 in | Wt 118.8 lb

## 2015-07-06 DIAGNOSIS — I482 Chronic atrial fibrillation, unspecified: Secondary | ICD-10-CM | POA: Insufficient documentation

## 2015-07-06 DIAGNOSIS — I4892 Unspecified atrial flutter: Secondary | ICD-10-CM | POA: Diagnosis not present

## 2015-07-06 DIAGNOSIS — I10 Essential (primary) hypertension: Secondary | ICD-10-CM

## 2015-07-06 DIAGNOSIS — I5042 Chronic combined systolic (congestive) and diastolic (congestive) heart failure: Secondary | ICD-10-CM | POA: Diagnosis not present

## 2015-07-06 DIAGNOSIS — I7100 Dissection of unspecified site of aorta: Secondary | ICD-10-CM

## 2015-07-06 DIAGNOSIS — I4891 Unspecified atrial fibrillation: Secondary | ICD-10-CM | POA: Diagnosis not present

## 2015-07-06 LAB — CBC WITH DIFFERENTIAL/PLATELET
Basophils Absolute: 0.1 10*3/uL (ref 0.0–0.1)
Basophils Relative: 1 % (ref 0–1)
Eosinophils Absolute: 0.1 10*3/uL (ref 0.0–0.7)
Eosinophils Relative: 1 % (ref 0–5)
HCT: 44 % (ref 36.0–46.0)
Hemoglobin: 14.3 g/dL (ref 12.0–15.0)
Lymphocytes Relative: 23 % (ref 12–46)
Lymphs Abs: 1.8 10*3/uL (ref 0.7–4.0)
MCH: 29.5 pg (ref 26.0–34.0)
MCHC: 32.5 g/dL (ref 30.0–36.0)
MCV: 90.7 fL (ref 78.0–100.0)
MPV: 9.5 fL (ref 8.6–12.4)
Monocytes Absolute: 0.9 10*3/uL (ref 0.1–1.0)
Monocytes Relative: 11 % (ref 3–12)
Neutro Abs: 5 10*3/uL (ref 1.7–7.7)
Neutrophils Relative %: 64 % (ref 43–77)
Platelets: 315 10*3/uL (ref 150–400)
RBC: 4.85 MIL/uL (ref 3.87–5.11)
RDW: 14.4 % (ref 11.5–15.5)
WBC: 7.8 10*3/uL (ref 4.0–10.5)

## 2015-07-06 LAB — BASIC METABOLIC PANEL
BUN: 14 mg/dL (ref 7–25)
CO2: 27 mmol/L (ref 20–31)
Calcium: 9.5 mg/dL (ref 8.6–10.4)
Chloride: 103 mmol/L (ref 98–110)
Creat: 0.89 mg/dL — ABNORMAL HIGH (ref 0.60–0.88)
Glucose, Bld: 109 mg/dL — ABNORMAL HIGH (ref 65–99)
Potassium: 4 mmol/L (ref 3.5–5.3)
Sodium: 139 mmol/L (ref 135–146)

## 2015-07-06 LAB — TSH: TSH: 1.633 u[IU]/mL (ref 0.350–4.500)

## 2015-07-06 MED ORDER — METOPROLOL SUCCINATE ER 50 MG PO TB24: ORAL_TABLET | ORAL | Status: DC

## 2015-07-06 MED ORDER — POTASSIUM CHLORIDE CRYS ER 20 MEQ PO TBCR: 20.0000 meq | EXTENDED_RELEASE_TABLET | Freq: Every day | ORAL | Status: DC

## 2015-07-06 MED ORDER — DIGOXIN 125 MCG PO TABS: 125.0000 ug | ORAL_TABLET | Freq: Every day | ORAL | Status: DC

## 2015-07-06 MED ORDER — DILTIAZEM HCL ER COATED BEADS 120 MG PO CP24: 120.0000 mg | ORAL_CAPSULE | Freq: Every day | ORAL | Status: DC

## 2015-07-06 MED ORDER — FUROSEMIDE 40 MG PO TABS: 40.0000 mg | ORAL_TABLET | Freq: Two times a day (BID) | ORAL | Status: DC

## 2015-07-06 NOTE — Progress Notes (Signed)
Cardiology Office Note Date:  07/06/2015  Patient ID:  [name], DOB 1927/12/27, MRN [id number] PCP:  London Pepper, MD  Cardiologist: Dr. Marlou Porch   Chief Complaint: f/u atrial fib  History of Present Illness: [name] is a 81 y.o. female with history of HTN, chronic atrial fibrillation/flutter, Type A aortic dissection thoracic repair by Dr. Darcey Nora with biologic Bentall procedure 2014, intramural hematoma of thoracic aorta not allowing chronic anticoagulation, chronic combined systolic/diastolic heart failure with ejection fraction of 40-45% who presents for f/u. Per review of chart, she has unknown coronary artery status as she was not a candidate for cardiac catheterization at the time of repair. The patient has a persistent patent false lumen distally in the proximal descending thoracic aorta being managed medically as she is not a candidate for redo aortic valve surgery or stent graft. She has had residual hoarseness which Dr. Prescott Gum felt that her  expanded aortic intramural hematoma may have caused traction on the recurrent laryngeal nerve causing hoarseness. She has not been a candidate for anticoagulation because of risk of further bleeding from Stanford type B dissection. Last echo 04/2013: EF 40-45% with diffuse HK, grade 2 DD, mild AI, mild MR, mod LAE, mildly reduced RV function, mild pulm HTN.  She comes in to clinic today feeling well. She was surprised to have appointment with me instead of Dr. Marlou Porch (this is the first time we've met). She made this appt because she is in need of med refills. She gleefully fills me in on all the details of her family - overjoyed recently that her grandson had his own child that she went to visit. Her daughter helps chauffeur her around since she no longer drives. She feels like she is doing great from a cardiac perspective - says "I can't believe it. 2 years ago it was not this way." No CP, SOB, palpitations, dizziness, near-syncope or syncope.  Energy is good. HR is running higher in clinic today. She reveals she has been out of digoxin for 2 days due to no refills. Her BP is also running slightly elevated in clinic today which she says is because she is nervous. She has not yet taken her Lasix or Diltiazem today. She is only taking metoprolol once a day because she said Dr. Marlou Porch has told her she could in the past.   Past Medical History  Diagnosis Date  . Chronic atrial fibrillation ([place])     a. Not on anticoag due to risk of further bleeding from Stanford type B dissection.  . Aortic root aneurysm ([place])   . Essential hypertension   . Chronic combined systolic and diastolic CHF (congestive heart failure) ([place])     a. echo 04/2013: EF 40-45% with diffuse HK, grade 2 DD, mild AI, mild MR, mod LAE, mildly reduced RV function, mild pulm HTN.  Marland Kitchen Aortic atherosclerosis ([place]) 06/03/2013  . Hypokalemia 08/06/2013  . Chronic atrial flutter ([place])   . Aortic dissection ([place])     a. Type A aortic dissection thoracic repair by Dr. Darcey Nora with biologic Bentall procedure 2014. b. has a persistent patent false lumen distally in the proximal descending thoracic aorta being managed medically as she is not a candidate for redo surgery.    Past Surgical History  Procedure Laterality Date  . Bentall procedure N/A 11/11/2012    Procedure: BENTALL PROCEDURE;  Surgeon: Ivin Poot, MD;  Location: [facility];  Service: Open Heart Surgery;  Laterality: N/A;  *CIRC ARREST*   .  Intraoperative transesophageal echocardiogram N/A 11/11/2012    Procedure: INTRAOPERATIVE TRANSESOPHAGEAL ECHOCARDIOGRAM;  Surgeon: Ivin Poot, MD;  Location: [facility];  Service: Open Heart Surgery;  Laterality: N/A;  . Ascending aortic aneurysm repair  10/2012    Current Outpatient Prescriptions  Medication Sig Dispense Refill  . acetaminophen (TYLENOL) 325 MG tablet Take 650 mg by mouth every 6 (six) hours as needed for mild pain.     Marland Kitchen aspirin 81 MG tablet Take 81 mg by mouth  daily.    . digoxin (LANOXIN) 0.125 MG tablet Take 1 tablet (125 mcg total) by mouth daily. APPOINTMENT NEEDED FOR FURTHER REFILLS 16 tablet 0  . diltiazem (CARDIZEM CD) 120 MG 24 hr capsule Take 120 mg by mouth daily.    . furosemide (LASIX) 40 MG tablet Take 40 mg by mouth 2 (two) times daily.    . metoprolol succinate (TOPROL-XL) 50 MG 24 hr tablet TAKE 1 TABLET BY MOUTH TWICE A DAY WITH OR IMMEDIATELY FOLLOWING A MEAL 30 tablet 0  . potassium chloride SA (KLOR-CON M20) 20 MEQ tablet Take 1 tablet (20 mEq total) by mouth daily. 30 tablet 3   No current facility-administered medications for this visit.    Allergies:   Review of patient's allergies indicates no known allergies.   Social History:  The patient  reports that she has never smoked. She has never used smokeless tobacco. She reports that she does not drink alcohol or use illicit drugs.   Family History:  The patient's family history is negative for Heart attack, Heart disease, Heart failure, and Hypertension.  ROS:  Please see the history of present illness.  All other systems are reviewed and otherwise negative.   PHYSICAL EXAM:  VS:  BP 140/90 mmHg  Pulse 110  Ht 5' (1.524 m)  Wt 118 lb 12.8 oz (53.887 kg)  BMI 23.20 kg/m2 BMI: Body mass index is 23.2 kg/(m^2). Recheck BP same. Well developed thin WF, in no acute distress HEENT: normocephalic, atraumatic Neck: no JVD, carotid bruits or masses Cardiac:  Irregularly irregular, rate around 100 at rest, no murmurs, rubs, or gallops Lungs:  clear to auscultation bilaterally, no wheezing, rhonchi or rales Abd: soft, nontender, no hepatomegaly, + BS MS: no deformity or atrophy Ext: no edema Skin: warm and dry, no rash Neuro:  moves all extremities spontaneously, no focal abnormalities noted, follows commands Psych: euthymic mood, full affect   EKG:  Done today shows atrial fib 110bpm, left axis deviation, low voltage QRS, nonspecific ST-T changes  Recent Labs: No  results found for requested labs within last 365 days.  No results found for requested labs within last 365 days.   CrCl cannot be calculated (Patient has no serum creatinine result on file.).   Wt Readings from Last 3 Encounters:  07/06/15 118 lb 12.8 oz (53.887 kg)  05/12/15 116 lb (52.617 kg)  10/01/14 116 lb (52.617 kg)     Other studies reviewed: Additional studies/records reviewed today include: summarized above  ASSESSMENT AND PLAN:  1. Chronic atrial fib/flutter - rates slightly elevated today but a) she has been out of digoxin for 2 days and b) has not yet taken her diltiazem yet today. Will refill. Per Dr. Marlou Porch, she has been challenging in the past to rate control. Will also update labwork to make sure renal function stable for digoxin. She is completely asymptomatic. Not on anticoagulation as above. 2. Chronic combined CHF - appears euvolemic. No symptoms. 3. Essential HTN - BP running higher in  clinic. She has not taken her Lasix or metoprolol yet today. She says pressure is up because she is nervous. She has a BP cuff at home and occasionally follows her pressure. I asked her to check her BP daily over the next several days and 1) call with readings in 1 week for [place] to review and to 2) call before then if she tends to notice readings tending to run >136 systolic or >85 diastolic. 4. Aortic dissection with residual persistent patent false lumen distally in the proximal descending thoracic aorta being managed medically - followed by [facility].  Disposition: F/u with Dr. Marlou Porch in 4 months.  Current medicines are reviewed at length with the patient today.  The patient did not have any concerns regarding medicines.  Raechel Ache PA-C 07/06/2015 11:30 AM     [facility] [address] [place] [address] [phone] (office)  [phone] (fax)

## 2015-07-06 NOTE — Patient Instructions (Addendum)
Medication Instructions:  Your physician has recommended you make the following change in your medication:  1.  CHANGE the Metoprolol to taking 1 time a day   Labwork: TODAY;  BMET                CBC W/DIFF     TSH    Testing/Procedures: NONE ORDERED  Follow-Up: Your physician wants you to follow-up in: 4 MONTHS WITH DR. Anne Fu.  You will receive a reminder letter in the mail two months in advance. If you don't receive a letter, please call our office to schedule the follow-up appointment.   Any Other Special Instructions Will Be Listed Below (If Applicable).  Keep a check on your blood pressure.  If it is running >130 on top or >80 on the bottom, call us.   Check your blood pressure a couple of times a day and call us in a week with the readings.   If you need a refill on your cardiac medications before your next appointment, please call your pharmacy.

## 2015-07-13 ENCOUNTER — Telehealth: Payer: Self-pay

## 2015-07-13 MED ORDER — DIGOXIN 125 MCG PO TABS: 0.0625 mg | ORAL_TABLET | Freq: Every day | ORAL | Status: DC

## 2015-07-13 NOTE — Telephone Encounter (Signed)
Patient is aware of medication changes and has nurse visit scheduled for next week.  Notes Recorded by Laurann Montana, PA-C on 07/12/2015 at 7:33 AM See phone note below. Please decrease digoxin dose as below. Have patient call us with her HR 4-5 days after making this change (or she can come in for a quick nurse visit to check vitals). Ronie Spies PA-C  Notes Recorded by Jake Bathe, MD on 07/12/2015 at 6:59 AM Good thought. With her age, creat cl., lets decrease dig to 0.0625mg  daily. In the past, she was difficult to rate control.  Donato Schultz, MD

## 2015-07-19 NOTE — Addendum Note (Signed)
Addended by: Reesa Chew on: 07/19/2015 05:40 PM   Modules accepted: Orders

## 2015-07-22 ENCOUNTER — Ambulatory Visit (INDEPENDENT_AMBULATORY_CARE_PROVIDER_SITE_OTHER): Payer: Commercial Managed Care - HMO

## 2015-07-22 VITALS — BP 147/84 | HR 102 | Wt 118.0 lb

## 2015-07-22 DIAGNOSIS — Z136 Encounter for screening for cardiovascular disorders: Secondary | ICD-10-CM

## 2015-07-22 DIAGNOSIS — Z0130 Encounter for examination of blood pressure without abnormal findings: Secondary | ICD-10-CM

## 2015-07-22 NOTE — Progress Notes (Signed)
Here for recheck of HR and BP after decrease in digoxin dose on 11/28 from 0.125 mg daily to 0.0625 mg BP 147/84 HR 102 No complaints History of medication change and creat clearance along with today's BP and HR to DOD Dr. Eden Emms No change in plan of care  Routed to Teola Bradley PA And Dr. Anne Fu

## 2015-07-22 NOTE — Patient Instructions (Signed)
Continue with present plan of care 

## 2015-08-20 ENCOUNTER — Ambulatory Visit: Payer: Commercial Managed Care - HMO | Admitting: Cardiology

## 2015-10-15 ENCOUNTER — Other Ambulatory Visit: Payer: Self-pay | Admitting: Physician Assistant

## 2015-10-22 DIAGNOSIS — L989 Disorder of the skin and subcutaneous tissue, unspecified: Secondary | ICD-10-CM | POA: Diagnosis not present

## 2015-11-01 ENCOUNTER — Encounter: Payer: Self-pay | Admitting: *Deleted

## 2015-11-04 ENCOUNTER — Ambulatory Visit (INDEPENDENT_AMBULATORY_CARE_PROVIDER_SITE_OTHER): Payer: Commercial Managed Care - HMO | Admitting: Cardiology

## 2015-11-04 ENCOUNTER — Encounter: Payer: Self-pay | Admitting: Cardiology

## 2015-11-04 VITALS — BP 128/80 | HR 64 | Ht 63.0 in | Wt 120.6 lb

## 2015-11-04 DIAGNOSIS — I4892 Unspecified atrial flutter: Secondary | ICD-10-CM

## 2015-11-04 DIAGNOSIS — I700 Atherosclerosis of aorta: Secondary | ICD-10-CM | POA: Diagnosis not present

## 2015-11-04 DIAGNOSIS — I7121 Aneurysm of the ascending aorta, without rupture: Secondary | ICD-10-CM

## 2015-11-04 DIAGNOSIS — I7100 Dissection of unspecified site of aorta: Secondary | ICD-10-CM

## 2015-11-04 DIAGNOSIS — I719 Aortic aneurysm of unspecified site, without rupture: Secondary | ICD-10-CM | POA: Diagnosis not present

## 2015-11-04 NOTE — Progress Notes (Signed)
[address] [place], [place]  [address] Phone: [phone] Fax:  [phone]  Date:  11/04/2015   ID:  [name], DOB [birth year], MRN [id number]  PCP:  Farris Has, MD   History of Present Illness: [name] is a 82 y.o. female with chronic atrial fibrillation/flutter, Type A aortic dissection thoracic repair by Dr. 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  Unfortunately, she was hospitalized 2 days after her prior appointment on 08/09/13 secondary to increasing insomnia, trouble breathing at night/acute on chronic systolic heart failure. We utilized IV Lasix which seemed to help and she was currently comfortable after a daily dose. CT scan was negative for pulmonary and was him. Her atrial fibrillation was overall well rate controlled on Toprol, digoxin as well as Cardizem. Once again, not on anticoagulation because of intramural aortic hematoma. Amiodarone was discontinued because rhythm control did not seem to be plausible. No cardioversion because of inability to anticoagulate.   At previous visits, her blood pressure was on the low side. Her Toprol was cut significantly. She ended up having increase heart rate with her atrial fibrillation and I increased her Toprol once again back to 50 XL twice a day. Her heart rate is much improved previously but once again was increased diltiazem CD 120 mg was added. She denies any dizziness, syncope.   Dr. Maren Beach prescribed her sublingual nitroglycerin when necessary. Remember, unknown coronary artery status as she was not a candidate for cardiac catheterization at the time of repair. The patient has a patent false lumen distally in the proximal descending thoracic aorta which is stable at 6 cm. She is not a candidate for further surgery or  TEVAR. Overall she  is doing well currently. Her atrial arrhythmia/atrial fibrillation is well compensated, rate controlled. She states that she is actually only taking one of the metoprolol extended release 50 mg tablets a day. She was to go shopping, has more energy.  10/01/14-overall doing very well. Occasionally she will have dizziness. She had an episode where after she got female, sat down and felt the room spinning. She feels well today. She has taken bus to [place] to visit family. Four grandchildren. Her hoarseness has improved.  11/04/15-last visit with with Ronie Spies. Overall she is doing well. Does not feel that her heart is causing any issues. She does not feel any tachycardia arrhythmias. Her atrial fibrillation is still challenging to rate control. She is not short of breath. After taking her pills in the morning she does feel somewhat tired. She is eager to go visit her family in [place].    Wt Readings from Last 3 Encounters:  11/04/15 120 lb 9.6 oz (54.704 kg)  07/22/15 118 lb (53.524 kg)  07/06/15 118 lb 12.8 oz (53.887 kg)     Past Medical History  Diagnosis Date  . Chronic atrial fibrillation ([facility])     a. Not on anticoag due to risk of further bleeding from Stanford type B dissection.  . Aortic root aneurysm ([facility])   . Essential hypertension   . Chronic combined systolic and diastolic CHF (congestive heart failure) ([facility])     a. echo 04/2013: EF 40-45% with diffuse HK, grade 2 DD, mild AI, mild MR, mod LAE, mildly reduced RV function, mild pulm HTN.  Marland Kitchen Aortic atherosclerosis ([facility]) 06/03/2013  . Hypokalemia 08/06/2013  .  Chronic atrial flutter ([facility])   . Aortic dissection ([facility])     a. Type A aortic dissection thoracic repair by Dr. Maren Beach with biologic Bentall procedure 2014. b. has a persistent patent false lumen distally in the proximal descending thoracic aorta being managed medically as she is not a candidate for redo surgery.    Past Surgical History  Procedure Laterality Date  .  Bentall procedure N/A 11/11/2012    Procedure: BENTALL PROCEDURE;  Surgeon: Kerin Perna, MD;  Location: [facility];  Service: Open Heart Surgery;  Laterality: N/A;  *CIRC ARREST*   . Intraoperative transesophageal echocardiogram N/A 11/11/2012    Procedure: INTRAOPERATIVE TRANSESOPHAGEAL ECHOCARDIOGRAM;  Surgeon: Kerin Perna, MD;  Location: [facility];  Service: Open Heart Surgery;  Laterality: N/A;  . Ascending aortic aneurysm repair  10/2012    Current Outpatient Prescriptions  Medication Sig Dispense Refill  . acetaminophen (TYLENOL) 325 MG tablet Take 650 mg by mouth every 6 (six) hours as needed for mild pain.     Marland Kitchen aspirin 81 MG tablet Take 81 mg by mouth daily.    . digoxin (LANOXIN) 0.125 MG tablet Take 0.5 tablets (0.0625 mg total) by mouth daily. 45 tablet 3  . diltiazem (CARDIZEM CD) 120 MG 24 hr capsule TAKE ONE CAPSULE BY MOUTH EVERY DAY 90 capsule 1  . doxycycline (MONODOX) 100 MG capsule TAKE ONE CAPSULE BY MOUTH EVERY 12 HOURS WITH FOOD FOR 5 DAYS  0  . furosemide (LASIX) 40 MG tablet Take 1 tablet (40 mg total) by mouth 2 (two) times daily. 180 tablet 1  . metoprolol succinate (TOPROL-XL) 50 MG 24 hr tablet TAKE 1 TABLET BY MOUTH DAILY  WITH OR IMMEDIATELY FOLLOWING A MEAL 90 tablet 1  . potassium chloride SA (KLOR-CON M20) 20 MEQ tablet Take 1 tablet (20 mEq total) by mouth daily. 30 tablet 3   No current facility-administered medications for this visit.    Allergies:   No Known Allergies  Social History:  The patient  reports that she has never smoked. She has never used smokeless tobacco. She reports that she does not drink alcohol or use illicit drugs.   ROS:  Please see the history of present illness.   Denies any fevers, chills, orthopnea, PND, syncope  All other systems reviewed and negative.   PHYSICAL EXAM: VS:  BP 128/80 mmHg  Pulse 64  Ht 5\' 3"  (1.6 m)  Wt 120 lb 9.6 oz (54.704 kg)  BMI 21.37 kg/m2 Well nourished, well developed, in no acute distress HEENT:  normal Neck: no JVD Cardiac:  Irregularly irregular; no murmur Lungs:  clear to auscultation bilaterally, no wheezing, rhonchi or rales Abd: soft, nontender, no hepatomegaly Ext: minimal edemaSkin: warm and dry Neuro: no focal abnormalities noted  EKG:   06/20/13: Atrial flutter/fibrillation heart rate 109, nonspecific ST-T wave changes, poor R wave progression, left axis deviation/prior inferior infarct pattern.   08/06/13-atrial flutter pattern with variable conduction, heart rate 91 beats per minute.  10/01/14-atrial fibrillation heart rate 108, left axis deviation, old inferior infarct.  Echocardiogram: 9/14: - Left ventricle: The cavity size was normal. Wall thickness was normal. Systolic function was mildly to moderately reduced. The estimated ejection fraction was in the range of 40% to 45%. Diffuse hypokinesis. There was septal-lateral dyssynchrony. Features are consistent with a pseudonormal left ventricular filling pattern, with concomitant abnormal relaxation and increased filling pressure (grade 2 diastolic dysfunction). - Aortic valve: There was no stenosis. Mild regurgitation. - Mitral valve: Mild regurgitation. -  Left atrium: The atrium was moderately dilated. - Right ventricle: The cavity size was normal. Systolic function was mildly reduced. - Right atrium: The atrium was moderately dilated. - Tricuspid valve: Peak RV-RA gradient:79mm Hg (S). - Pulmonary arteries: PA peak pressure: 38mm Hg (S). - Systemic veins: IVC measured 2.3 cm with some respirophasic variation, suggesting RA pressure 10 mmHg. Impressions:  - Normal LV size with EF 40-45%. Diffuse hypokinesis with septal-lateral dyssynchrony. Normal RV size with mildly decreased systolic function. Moderate biatrial enlargement. Mild pulmonary hypertension. Mild MR and AI.  ASSESSMENT AND PLAN:  1. Atrial flutter/fibrillation- Stopped amiodarone in the hospital since it was not maintaining any semblance of  rhythm control. Metoprolol extended release 50 mg once a day and diltiazem CD 120 mg once a day to her drug regimen as well. She has been on digoxin because of difficulty with rate control. I contemplated discontinuing this medication but since she is doing so well currently, we will continue since she still has some tachycardia. Aspirin only. 81 mg. Heart rate today is 108 bpm. Still somewhat challenging to rate control.  2. Acute on chronic diastolic/systolic heart failure-ER visit from 08/01/13 reviewed. Lasix 40 mg twice a day was administered. Her weight is down approximately 6 pounds. I will continue with Lasix 40 mg twice a day for maintenance. Continue with potassium supplementation. Feeling well. 3. Aortic dissection repair/Bentall procedure- Prior note reviewed. No change, stable. 4. Intramural hematoma of aorta. Unable to give Coumadin because of this. She has been following with Dr. Maren Beach 5. Hypertension/hypotension-ACE inhibitor off. Limiting.Combined systolic/diastolic heart failure-EF 40-45%. Trying to rate control atrial flutter/fibrillation as best as possible. Unable to utilize ACE inhibitor because of prior hypotension. 6. Cough/hoarseness-interestingly, she did not have this immediately after the surgery she states. I spoke with Dr. Maren Beach who that her expanded aortic intramural hematoma may have caused traction on the recurrent laryngeal nerve causing hoarseness. She has improved with this.  7. 6 months follow up.    Signed, Donato Schultz, MD [facility]  11/04/2015 11:51 AM

## 2015-11-04 NOTE — Patient Instructions (Signed)

## 2015-11-10 ENCOUNTER — Ambulatory Visit: Payer: Commercial Managed Care - HMO | Admitting: Cardiothoracic Surgery

## 2015-12-08 ENCOUNTER — Ambulatory Visit: Payer: Commercial Managed Care - HMO | Admitting: Cardiothoracic Surgery

## 2016-01-08 ENCOUNTER — Other Ambulatory Visit: Payer: Self-pay | Admitting: Physician Assistant

## 2016-02-05 ENCOUNTER — Other Ambulatory Visit: Payer: Self-pay | Admitting: Physician Assistant

## 2016-04-19 ENCOUNTER — Ambulatory Visit: Payer: Commercial Managed Care - HMO | Admitting: Cardiothoracic Surgery

## 2016-04-21 ENCOUNTER — Ambulatory Visit (INDEPENDENT_AMBULATORY_CARE_PROVIDER_SITE_OTHER): Payer: Commercial Managed Care - HMO | Admitting: Cardiothoracic Surgery

## 2016-04-21 ENCOUNTER — Ambulatory Visit
Admission: RE | Admit: 2016-04-21 | Discharge: 2016-04-21 | Disposition: A | Discharge status: Home / Self Care | Payer: Commercial Managed Care - HMO | Source: Ambulatory Visit | Attending: Cardiothoracic Surgery | Admitting: Cardiothoracic Surgery

## 2016-04-21 ENCOUNTER — Encounter: Payer: Self-pay | Admitting: Cardiothoracic Surgery

## 2016-04-21 ENCOUNTER — Ambulatory Visit: Payer: Commercial Managed Care - HMO | Admitting: Cardiothoracic Surgery

## 2016-04-21 ENCOUNTER — Other Ambulatory Visit: Payer: Self-pay | Admitting: *Deleted

## 2016-04-21 VITALS — BP 139/90 | HR 120 | Resp 20 | Ht 63.0 in | Wt 116.0 lb

## 2016-04-21 DIAGNOSIS — Z9889 Other specified postprocedural states: Secondary | ICD-10-CM | POA: Diagnosis not present

## 2016-04-21 DIAGNOSIS — I712 Thoracic aortic aneurysm, without rupture, unspecified: Secondary | ICD-10-CM

## 2016-04-21 DIAGNOSIS — I719 Aortic aneurysm of unspecified site, without rupture: Secondary | ICD-10-CM | POA: Diagnosis not present

## 2016-04-21 DIAGNOSIS — I7121 Aneurysm of the ascending aorta, without rupture: Secondary | ICD-10-CM

## 2016-04-21 DIAGNOSIS — I7100 Dissection of unspecified site of aorta: Secondary | ICD-10-CM | POA: Diagnosis not present

## 2016-04-21 NOTE — Progress Notes (Signed)
PCP is Farris HasMORROW, [name], MD Referring Provider is Jake BatheSkains, Mark C, MD  Chief Complaint  Patient presents with  . Follow-up    6 month f/u HX of aortic dissection    HPI: Patient presents for one-year follow-up after a biologic-Bentall procedure in 2014 for treatment of an ascending aneurysm with dissection. The patient has a bioprosthetic aortic valve. She has a persistent false lumen of the distal arch and descending thoracic aorta with dilatation to 5 cm. This is asymptomatic. 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  She presents today stating she feels tired. Her blood pressure is satisfactory There is no evidence of fluid retention-edema Her heart rate is 120-125 atrial fibrillation. She states she had coffee this morning but she did take her medications as prescribed. Her aortic prosthesis appears to be functioning well without murmur and lungs are clear.  A chest x-ray is pending.  Past Medical History:  Diagnosis Date  . Aortic atherosclerosis (HCC) 06/03/2013  . Aortic dissection (HCC)    a. Type A aortic dissection thoracic repair by Dr. Maren BeachVanTrigt with biologic Bentall procedure 2014. b. has a persistent patent false lumen distally in the proximal descending thoracic aorta being managed medically as she is not a candidate for redo surgery.  . Aortic root aneurysm (HCC)   . Chronic atrial fibrillation (HCC)    a. Not on anticoag due to risk of further bleeding from Stanford type B dissection.  . Chronic atrial flutter (HCC)   . Chronic combined systolic and diastolic CHF (congestive heart failure) (HCC)    a. echo 04/2013: EF 40-45% with diffuse HK, grade 2 DD, mild AI, mild MR, mod LAE, mildly reduced RV function, mild pulm HTN.  . Essential hypertension   . Hypokalemia 08/06/2013     Past Surgical History:  Procedure Laterality Date  . ASCENDING AORTIC ANEURYSM REPAIR  10/2012  . BENTALL PROCEDURE N/A 11/11/2012   Procedure: BENTALL PROCEDURE;  Surgeon: Kerin PernaPeter Van Trigt, MD;  [facility];  Service: Open Heart Surgery;  Laterality: N/A;  *CIRC ARREST*   . INTRAOPERATIVE TRANSESOPHAGEAL ECHOCARDIOGRAM N/A 11/11/2012   Procedure: INTRAOPERATIVE TRANSESOPHAGEAL ECHOCARDIOGRAM;  Surgeon: Kerin PernaPeter Van Trigt, MD;  [facility];  Service: Open Heart Surgery;  Laterality: N/A;    Family History  Problem Relation Age of Onset  . Heart attack Neg Hx   . Heart disease Neg Hx   . Heart failure Neg Hx   . Hypertension Neg Hx     Social History Social History  Substance Use Topics  . Smoking status: Never Smoker  . Smokeless tobacco: Never Used  . Alcohol use No    Current Outpatient Prescriptions  Medication Sig Dispense Refill  . acetaminophen (TYLENOL) 325 MG tablet Take 650 mg by mouth every 6 (six) hours as needed for mild pain.     Marland Kitchen. aspirin 81 MG tablet Take 81 mg by mouth daily.    . digoxin (LANOXIN) 0.125 MG tablet Take 0.5 tablets (0.0625 mg total) by mouth daily. 45 tablet 3  . diltiazem (CARDIZEM CD) 120 MG 24 hr capsule TAKE ONE CAPSULE BY MOUTH EVERY DAY 90 capsule 1  . furosemide (LASIX) 40 MG tablet TAKE 1 TABLET (40 MG TOTAL) BY MOUTH 2 (TWO) TIMES DAILY. 180 tablet 2  . KLOR-CON M20 20 MEQ tablet TAKE 1 TABLET BY MOUTH  EVERY DAY 30 tablet 3  . metoprolol succinate (TOPROL-XL) 50 MG 24 hr tablet TAKE 1 TABLET BY MOUTH DAILY  WITH OR IMMEDIATELY FOLLOWING A MEAL 90 tablet 1   No current facility-administered medications for this visit.     No Known Allergies  Review of Systems  No chest pain No shortness of breath Mild weight loss since her last visit  She bruised her left hand on a box at a store with some bleeding now with an eschar No ankle edema Poor appetite No fever No new neurologic symptoms  BP 139/90 (BP Location: Right Arm,  Patient Position: Sitting, Cuff Size: Normal)   Pulse (!) 120   Resp 20   Ht 5\' 3"  (1.6 m)   Wt 116 lb (52.6 kg)   SpO2 98% Comment: RA  BMI 20.55 kg/m  Physical Exam     Physical Exam  General: Elderly small frail-appearing female in no acute distress. HEENT: Normocephalic pupils equal , dentition adequate Neck: Supple without JVD, adenopathy, or bruit Chest: Clear to auscultation, symmetrical breath sounds, no rhonchi, no tenderness   Well-healed sternal incision Cardiovascular: Rapid irregular cardiac rhythm, no murmur, no gallop, peripheral pulses             palpable in all extremities Abdomen:  Soft, nontender, no palpable mass or organomegaly Extremities: Warm, well-perfused, no clubbing cyanosis edema or tenderness,              no venous stasis changes of the legs Rectal/GU: Deferred Neuro: Grossly non--focal and symmetrical throughout, generally weak Skin: Clean and dry without rash or ulceration   Diagnostic Tests: Chest x-ray today pending  Impression: Status post repair of a ascending aneurysm-dissection with persistent dilatation of the false lumen from the distal arch to the descending thoracic aorta. This is not operable in this patient and remains asymptomatic. Blood pressure control is the most important therapy.  Rapid atrial fibrillation-today it is in poor control with heart rate 120 to 130. We will arrange to have her seen in the cardiology office for medicine titration. She will take an extra dose of Cardizem today.  Plan: I will continue to follow her thoracic aortic disease even though she is not an operative candidate. She  willreturn in  a year with chest x-ray. No more chest CT scans are needed since the patient is not operable. She should continue with good blood pressure control and avoid anticoagulation.  Mikey Bussing, MD [facility] [phone]

## 2016-04-24 ENCOUNTER — Ambulatory Visit (INDEPENDENT_AMBULATORY_CARE_PROVIDER_SITE_OTHER): Payer: Commercial Managed Care - HMO | Admitting: Physician Assistant

## 2016-04-24 ENCOUNTER — Encounter: Payer: Self-pay | Admitting: Physician Assistant

## 2016-04-24 VITALS — BP 134/82 | HR 103 | Ht 63.0 in | Wt 118.2 lb

## 2016-04-24 DIAGNOSIS — I482 Chronic atrial fibrillation, unspecified: Secondary | ICD-10-CM

## 2016-04-24 DIAGNOSIS — I10 Essential (primary) hypertension: Secondary | ICD-10-CM | POA: Diagnosis not present

## 2016-04-24 DIAGNOSIS — I7121 Aneurysm of the ascending aorta, without rupture: Secondary | ICD-10-CM

## 2016-04-24 DIAGNOSIS — I5042 Chronic combined systolic (congestive) and diastolic (congestive) heart failure: Secondary | ICD-10-CM

## 2016-04-24 DIAGNOSIS — I719 Aortic aneurysm of unspecified site, without rupture: Secondary | ICD-10-CM | POA: Diagnosis not present

## 2016-04-24 MED ORDER — DILTIAZEM HCL ER COATED BEADS 240 MG PO CP24: 240.0000 mg | ORAL_CAPSULE | Freq: Every day | ORAL | 6 refills | Status: DC

## 2016-04-24 NOTE — Patient Instructions (Addendum)
Medication Instructions:   START TAKING DILTAIZEM 240 MG ONCE A DAY   If you need a refill on your cardiac medications before your next appointment, please call your pharmacy.  Labwork: NONE ORDER TODAY    Testing/Procedures: NONE ORDER TODAY    Follow-Up: WITH DR Anne Fu IN 4 MONTHS    Any Other Special Instructions Will Be Listed Below (If Applicable).

## 2016-04-24 NOTE — Progress Notes (Signed)
Cardiology Office Note    Date:  04/24/2016   ID:  [name], DOB [birth year], MRN [id number]  PCP:  Farris Has, MD  Cardiologist: Dr. Anne Fu  No chief complaint on file.   History of Present Illness:  [name] is a 82 y.o. female with chronic atrial fibrillation/flutter, Type A aortic dissection thoracic repair by Dr. 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 Dr. Maren Beach prescribed her sublingual nitroglycerin when necessary. Remember, unknown coronary artery status as she was not a candidate for cardiac catheterization at the time of repair. The patient has a patent false lumen distally in the proximal descending thoracic aorta which is stable at 6 cm. She is not a candidate for further surgery or TAVR.  Last saw Dr. Anne Fu in 10/2015 and was doing well. Saw Dr. Alla German  04/21/16 and her heart rate was 120-125 bpm atrial fibrillation.  Patient comes in today feeling quite well. She denies chest pain, palpitations, dyspnea, dyspnea on exertion, dizziness or presyncope. Her blood pressure is stable. She doesn't feel her heart racing.        Past Medical History:  Diagnosis Date  . Aortic atherosclerosis (HCC) 06/03/2013  . Aortic dissection (HCC)    a. Type A aortic dissection thoracic repair by Dr. Maren Beach with biologic Bentall procedure 2014. b. has a persistent patent false lumen distally in the proximal descending thoracic aorta being managed medically as she is not a candidate for redo surgery.  . Aortic root aneurysm (HCC)   . Chronic atrial fibrillation (HCC)    a. Not on anticoag due to risk of further bleeding from Stanford type B dissection.  . Chronic atrial flutter (HCC)   . Chronic combined systolic and diastolic CHF (congestive heart failure) (HCC)    a. echo 04/2013: EF 40-45% with diffuse HK, grade 2 DD,  mild AI, mild MR, mod LAE, mildly reduced RV function, mild pulm HTN.  . Essential hypertension   . Hypokalemia 08/06/2013    Past Surgical History:  Procedure Laterality Date  . ASCENDING AORTIC ANEURYSM REPAIR  10/2012  . BENTALL PROCEDURE N/A 11/11/2012   Procedure: BENTALL PROCEDURE;  Surgeon: Kerin Perna, MD;  Location: [facility] OR;  Service: Open Heart Surgery;  Laterality: N/A;  *CIRC ARREST*   . INTRAOPERATIVE TRANSESOPHAGEAL ECHOCARDIOGRAM N/A 11/11/2012   Procedure: INTRAOPERATIVE TRANSESOPHAGEAL ECHOCARDIOGRAM;  Surgeon: Kerin Perna, MD;  Location: [facility] OR;  Service: Open Heart Surgery;  Laterality: N/A;    Current Medications: Outpatient Medications Prior to Visit  Medication Sig Dispense Refill  . acetaminophen (TYLENOL) 325 MG tablet Take 650 mg by mouth every 6 (six) hours as needed for mild pain.     Marland Kitchen aspirin 81 MG tablet Take 81 mg by mouth daily.    . digoxin (LANOXIN) 0.125 MG tablet Take 0.5 tablets (0.0625 mg total) by mouth daily. 45 tablet 3  . diltiazem (CARDIZEM CD) 120 MG 24 hr capsule TAKE ONE CAPSULE BY MOUTH EVERY DAY 90 capsule 1  . furosemide (LASIX) 40 MG tablet TAKE 1 TABLET (40 MG TOTAL) BY MOUTH 2 (TWO) TIMES DAILY. 180 tablet 2  . KLOR-CON M20 20 MEQ tablet TAKE 1 TABLET BY MOUTH EVERY DAY 30 tablet 3  . metoprolol succinate (TOPROL-XL) 50 MG 24 hr tablet TAKE 1 TABLET BY MOUTH DAILY  WITH OR IMMEDIATELY FOLLOWING A MEAL 90 tablet 1   No facility-administered medications prior  to visit.      Allergies:   Review of patient's allergies indicates no known allergies.   Social History   Social History  . Marital status: Widowed    Spouse name: N/A  . Number of children: 2  . Years of education: N/A   Social History Main Topics  . Smoking status: Never Smoker  . Smokeless tobacco: Never Used  . Alcohol use No  . Drug use: No  . Sexual activity: No   Other Topics Concern  . None   Social History Narrative   From [place], [place]      Family History:  The patient's   family history is not on file.   ROS:   Please see the history of present illness.    Review of Systems  Constitution: Positive for weakness.  HENT: Negative.   Eyes: Negative.   Cardiovascular: Negative.   Respiratory: Negative.   Hematologic/Lymphatic: Negative.   Musculoskeletal: Negative.  Negative for joint pain.  Gastrointestinal: Negative.   Genitourinary: Negative.    All other systems reviewed and are negative.   PHYSICAL EXAM:   VS:  BP 134/82   Pulse (!) 103   Ht 5\' 3"  (1.6 m)   Wt 118 lb 3.2 oz (53.6 kg)   BMI 20.94 kg/m   Physical Exam  GEN: Well nourished, well developed, in no acute distress  Neck: no JVD, carotid bruits, or masses Cardiac: Irregular irregular with 2/6 systolic murmur at the left sternal border, no rubs, or gallops  Respiratory:  clear to auscultation bilaterally, normal work of breathing GI: soft, nontender, nondistended, + BS Ext: without cyanosis, clubbing, or edema, Good distal pulses bilaterally MS: no deformity or atrophy  Skin: warm and dry, no rash Psych: euthymic mood, full affect  Wt Readings from Last 3 Encounters:  04/24/16 118 lb 3.2 oz (53.6 kg)  04/21/16 116 lb (52.6 kg)  11/04/15 120 lb 9.6 oz (54.7 kg)      Studies/Labs Reviewed:   EKG:  EKG is  ordered today.  The ekg ordered today demonstrates Atrial fibrillation 103 bpm poor R wave progression anteriorly  Recent Labs: 07/06/2015: BUN 14; Creat 0.89; Hemoglobin 14.3; Platelets 315; Potassium 4.0; Sodium 139; TSH 1.633   Lipid Panel No results found for: CHOL, TRIG, HDL, CHOLHDL, VLDL, LDLCALC, LDLDIRECT  Additional studies/ records that were reviewed today include:  2-D echo 04/2013 Study Conclusions  - Left ventricle: The cavity size was normal. Wall thickness   was normal. Systolic function was mildly to moderately   reduced. The estimated ejection fraction was in the range   of 40% to 45%. Diffuse hypokinesis. There  was   septal-lateral dyssynchrony. Features are consistent with   a pseudonormal left ventricular filling pattern, with   concomitant abnormal relaxation and increased filling   pressure (grade 2 diastolic dysfunction). - Aortic valve: There was no stenosis. Mild regurgitation. - Mitral valve: Mild regurgitation. - Left atrium: The atrium was moderately dilated. - Right ventricle: The cavity size was normal. Systolic   function was mildly reduced. - Right atrium: The atrium was moderately dilated. - Tricuspid valve: Peak RV-RA gradient:2mm Hg (S). - Pulmonary arteries: PA peak pressure: 38mm Hg (S). - Systemic veins: IVC measured 2.3 cm with some   respirophasic variation, suggesting RA pressure 10 mmHg. Impressions:  - Normal LV size with EF 40-45%. Diffuse hypokinesis with   septal-lateral dyssynchrony. Normal RV size with mildly   decreased systolic function. Moderate biatrial   enlargement. Mild pulmonary  hypertension. Mild MR and AI. Transthoracic echocardiography.  M-mode, complete 2D, spectral Doppler, and color Doppler.  Height:  Height: 154.9cm. Height: 61in.  Weight:  Weight: 60kg. Weight: 132lb.  Body mass index:  BMI: 25kg/m^2.  Body surface area:    BSA: 1.7741m^2.  Patient status:  Inpatient.  Location: ICU/CCU     ASSESSMENT:    1. Chronic atrial fibrillation (HCC)   2. Aortic root aneurysm (HCC)   3. Chronic combined systolic and diastolic CHF (congestive heart failure) (HCC)   4. Essential hypertension      PLAN:  In order of problems listed above: Chronic atrial fibrillation with RVR. Blood pressure is stable today so can titrate up diltiazem to 240 mg once daily. Dr. Anne FuSkains spoke with patient and concurs. Follow-up with Dr. Anne FuSkains in 4 months  Aortic root aneurysm status post Bentyl procedure in 2014 for a sending aneurysm with dissection. Patient has a bioprosthetic aortic valve. Persistent false lumen of the distal arch and descending thoracic aorta  with dilatation to 5 cm asymptomatic. Not a candidate for further surgery. 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  Chronic combined systolic and diastolic CHF compensated  Essential hypertension stable today has been on the low side in the past limiting titration of medications. Will increase diltiazem to 240 mg daily.     Medication Adjustments/Labs and Tests Ordered: Current medicines are reviewed at length with the patient today.  Concerns regarding medicines are outlined above.  Medication changes, Labs and Tests ordered today are listed in the Patient Instructions below. Patient Instructions  Medication Instructions:   START TAKING DILTAIZEM 240 MG ONCE A DAY   If you need a refill on your cardiac medications before your next appointment, please call your pharmacy.  Labwork: NONE ORDER TODAY    Testing/Procedures: NONE ORDER TODAY    Follow-Up: WITH DR Anne FuSKAINS IN 4 MONTHS    Any Other Special Instructions Will Be Listed Below (If Applicable).                                                                                                                                                      Elson ClanSigned, Pierce Biagini, PA-C  04/24/2016 9:03 AM    [facility] [address], [place], [place]  [address] Phone: [phone]; Fax: [phone]

## 2016-05-03 ENCOUNTER — Other Ambulatory Visit: Payer: Self-pay | Admitting: Cardiology

## 2016-05-20 ENCOUNTER — Other Ambulatory Visit: Payer: Self-pay | Admitting: Physician Assistant

## 2016-05-22 ENCOUNTER — Other Ambulatory Visit: Payer: Self-pay | Admitting: Physician Assistant

## 2016-11-06 ENCOUNTER — Ambulatory Visit (INDEPENDENT_AMBULATORY_CARE_PROVIDER_SITE_OTHER): Payer: Medicare HMO | Admitting: Cardiology

## 2016-11-06 ENCOUNTER — Encounter: Payer: Self-pay | Admitting: Cardiology

## 2016-11-06 ENCOUNTER — Encounter (INDEPENDENT_AMBULATORY_CARE_PROVIDER_SITE_OTHER): Payer: Self-pay

## 2016-11-06 VITALS — BP 130/82 | HR 98 | Ht 63.0 in | Wt 118.4 lb

## 2016-11-06 DIAGNOSIS — I7100 Dissection of unspecified site of aorta: Secondary | ICD-10-CM

## 2016-11-06 DIAGNOSIS — I482 Chronic atrial fibrillation: Secondary | ICD-10-CM

## 2016-11-06 DIAGNOSIS — I5042 Chronic combined systolic (congestive) and diastolic (congestive) heart failure: Secondary | ICD-10-CM | POA: Diagnosis not present

## 2016-11-06 DIAGNOSIS — I700 Atherosclerosis of aorta: Secondary | ICD-10-CM

## 2016-11-06 DIAGNOSIS — I4821 Permanent atrial fibrillation: Secondary | ICD-10-CM

## 2016-11-06 MED ORDER — METOPROLOL SUCCINATE ER 50 MG PO TB24: ORAL_TABLET | ORAL | 3 refills | Status: DC

## 2016-11-06 MED ORDER — DILTIAZEM HCL ER COATED BEADS 240 MG PO CP24: 240.0000 mg | ORAL_CAPSULE | Freq: Every day | ORAL | 6 refills | Status: DC

## 2016-11-06 MED ORDER — DIGOXIN 125 MCG PO TABS: 125.0000 ug | ORAL_TABLET | Freq: Every day | ORAL | 3 refills | Status: DC

## 2016-11-06 MED ORDER — FUROSEMIDE 40 MG PO TABS: ORAL_TABLET | ORAL | 2 refills | Status: DC

## 2016-11-06 NOTE — Patient Instructions (Signed)

## 2016-11-06 NOTE — Progress Notes (Signed)
Cardiology Office Note    Date:  11/06/2016   ID:  [name], DOB 1927/11/27, MRN [id number]  PCP:  Farris Has, MD  Cardiologist:   Donato Schultz, MD     History of Present Illness:  [name] is a 83 y.o. female with type A aortic dissection repair by Dr. Maren Beach, permanent atrial fibrillation/flutter, here for follow-up.  Previously hematoma, intramural of aorta. We did not have her on anticoagulation because of this.  Remember, unknown coronary artery status as she was not a candidate for cardiac catheterization at the time of repair. The patient has a patent false lumen distally in the proximal descending thoracic aorta which is stable at 6 cm. She is not a candidate for further surgery or  TEVAR. Overall she is doing well currently.  No SOB, no CP, no palps. Feels well. Moving part time to [place].   Past Medical History:  Diagnosis Date  . Aortic atherosclerosis (HCC) 06/03/2013  . Aortic dissection (HCC)    a. Type A aortic dissection thoracic repair by Dr. Maren Beach with biologic Bentall procedure 2014. b. has a persistent patent false lumen distally in the proximal descending thoracic aorta being managed medically as she is not a candidate for redo surgery.  . Aortic root aneurysm (HCC)   . Chronic atrial fibrillation (HCC)    a. Not on anticoag due to risk of further bleeding from Stanford type B dissection.  . Chronic atrial flutter (HCC)   . Chronic combined systolic and diastolic CHF (congestive heart failure) (HCC)    a. echo 04/2013: EF 40-45% with diffuse HK, grade 2 DD, mild AI, mild MR, mod LAE, mildly reduced RV function, mild pulm HTN.  . Essential hypertension   . Hypokalemia 08/06/2013    Past Surgical History:  Procedure Laterality Date  . ASCENDING AORTIC ANEURYSM REPAIR  10/2012  . BENTALL PROCEDURE N/A 11/11/2012   Procedure: BENTALL PROCEDURE;  Surgeon: Kerin Perna, MD;  Location: [facility] OR;  Service: Open Heart Surgery;  Laterality: N/A;  *CIRC  ARREST*   . INTRAOPERATIVE TRANSESOPHAGEAL ECHOCARDIOGRAM N/A 11/11/2012   Procedure: INTRAOPERATIVE TRANSESOPHAGEAL ECHOCARDIOGRAM;  Surgeon: Kerin Perna, MD;  Location: [facility] OR;  Service: Open Heart Surgery;  Laterality: N/A;    Current Medications: Outpatient Medications Prior to Visit  Medication Sig Dispense Refill  . acetaminophen (TYLENOL) 325 MG tablet Take 650 mg by mouth every 6 (six) hours as needed for mild pain.     Marland Kitchen aspirin 81 MG tablet Take 81 mg by mouth daily.    . digoxin (LANOXIN) 0.125 MG tablet TAKE 1 TABLET BY MOUTH EVERY DAY 90 tablet 3  . diltiazem (CARDIZEM CD) 240 MG 24 hr capsule Take 1 capsule (240 mg total) by mouth daily. 30 capsule 6  . diltiazem (CARDIZEM CD) 240 MG 24 hr capsule Take 1 capsule (240 mg total) by mouth daily. 90 capsule 3  . furosemide (LASIX) 40 MG tablet TAKE 1 TABLET (40 MG TOTAL) BY MOUTH 2 (TWO) TIMES DAILY. 180 tablet 2  . KLOR-CON M20 20 MEQ tablet TAKE 1 TABLET BY MOUTH EVERY DAY 30 tablet 3  . metoprolol succinate (TOPROL-XL) 50 MG 24 hr tablet TAKE 1 TABLET BY MOUTH DAILY WITH OR IMMEDIATELY FOLLOWING A MEAL 90 tablet 3   No facility-administered medications prior to visit.      Allergies:   Patient has no known allergies.   Social History   Social History  . Marital status: Widowed    Spouse name:  N/A  . Number of children: 2  . Years of education: N/A   Social History Main Topics  . Smoking status: Never Smoker  . Smokeless tobacco: Never Used  . Alcohol use No  . Drug use: No  . Sexual activity: No   Other Topics Concern  . None   Social History Narrative   From [place], [place]     Family History:  No early CAD history   ROS:   Please see the history of present illness.    ROS All other systems reviewed and are negative.   PHYSICAL EXAM:   VS:  BP 130/82   Pulse 98   Ht 5\' 3"  (1.6 m)   Wt 118 lb 6.4 oz (53.7 kg)   BMI 20.97 kg/m    GEN: Well nourished, well developed, in no acute distress    HEENT: normal  Neck: no JVD, carotid bruits, or masses Cardiac: irreg, mildly tachy; no murmurs, rubs, or gallops,no edema  Respiratory:  clear to auscultation bilaterally, normal work of breathing GI: soft, nontender, nondistended, + BS MS: no deformity or atrophy  Skin: warm and dry, no rash Neuro:  Alert and Oriented x 3, Strength and sensation are intact Psych: euthymic mood, full affect  Wt Readings from Last 3 Encounters:  11/06/16 118 lb 6.4 oz (53.7 kg)  04/24/16 118 lb 3.2 oz (53.6 kg)  04/21/16 116 lb (52.6 kg)      Studies/Labs Reviewed:   EKG:  04/24/16-atrial fibrillation with anterior fascicular block heart rate 103 bpm. Personally viewed.  Recent Labs: No results found for requested labs within last 8760 hours.   Lipid Panel No results found for: CHOL, TRIG, HDL, CHOLHDL, VLDL, LDLCALC, LDLDIRECT  Additional studies/ records that were reviewed today include:   ECHO 04/2013  - Left ventricle: The cavity size was normal. Wall thickness was normal. Systolic function was mildly to moderately reduced. The estimated ejection fraction was in the range of 40% to 45%. Diffuse hypokinesis. There was septal-lateral dyssynchrony. Features are consistent with a pseudonormal left ventricular filling pattern, with concomitant abnormal relaxation and increased filling pressure (grade 2 diastolic dysfunction). - Aortic valve: There was no stenosis. Mild regurgitation. - Mitral valve: Mild regurgitation. - Left atrium: The atrium was moderately dilated. - Right ventricle: The cavity size was normal. Systolic function was mildly reduced. - Right atrium: The atrium was moderately dilated. - Tricuspid valve: Peak RV-RA gradient:80mm Hg (S). - Pulmonary arteries: PA peak pressure: 38mm Hg (S). - Systemic veins: IVC measured 2.3 cm with some respirophasic variation, suggesting RA pressure 10 mmHg. Impressions:  - Normal LV size with EF 40-45%. Diffuse  hypokinesis with septal-lateral dyssynchrony. Normal RV size with mildly decreased systolic function. Moderate biatrial enlargement. Mild pulmonary hypertension. Mild MR and AI.    ASSESSMENT:    1. Permanent atrial fibrillation (HCC)   2. Chronic combined systolic and diastolic CHF (congestive heart failure) (HCC)   3. Dissection of aorta, unspecified portion of aorta (HCC)   4. Aortic atherosclerosis (HCC)      PLAN:  In order of problems listed above:  Permanent atrial fibrillation/flutter  - Previously tried amiodarone but this was stopped because of permanent atrial fibrillation.  - Trouble with rate control, medications reviewed as above. Doing fairly well today.  Chronic diastolic/systolic heart failure  - Fairly well stable. EF 45%.  - Unable to utilize ACE inhibitor because of previous hypotension. Low-dose Toprol 50 mg.  Aortic dissection repair/Bentall procedure  - Stable, intramural hematoma  of aorta, unable to give Coumadin because of this.  Hypotension  - Improved. No longer with hypotension.  Cough/hoarseness-this has improved. Intramural hematoma expansion may have caused traction to the recurrent laryngeal nerve causing hoarseness. This has improved.    Medication Adjustments/Labs and Tests Ordered: Current medicines are reviewed at length with the patient today.  Concerns regarding medicines are outlined above.  Medication changes, Labs and Tests ordered today are listed in the Patient Instructions below. Patient Instructions  Medication Instructions:  The current medical regimen is effective;  continue present plan and medications.  Follow-Up: Follow up in 6 months with Dr. Anne Fu.  You will receive a letter in the mail 2 months before you are due.  Please call us when you receive this letter to schedule your follow up appointment.  If you need a refill on your cardiac medications before your next appointment, please call your pharmacy.  Thank  you for choosing [facility]!!        Signed, Donato Schultz, MD  11/06/2016 2:14 PM    [facility] [address] [place], [place], [place]  [address] Phone: [phone]; Fax: [phone]

## 2016-11-19 ENCOUNTER — Other Ambulatory Visit: Payer: Self-pay | Admitting: Physician Assistant

## 2016-11-20 NOTE — Telephone Encounter (Signed)
Medication Detail    Disp Refills Start End   diltiazem (CARDIZEM CD) 240 MG 24 hr capsule 30 capsule 6 11/06/2016    Sig - Route: Take 1 capsule (240 mg total) by mouth daily. - Oral   E-Prescribing Status: Receipt confirmed by pharmacy (11/06/2016 1:50 PM EDT)   Pharmacy   [facility] [phone] - [place], [place] - [address]

## 2017-04-17 ENCOUNTER — Other Ambulatory Visit: Payer: Self-pay | Admitting: Cardiothoracic Surgery

## 2017-04-17 DIAGNOSIS — I7101 Dissection of thoracic aorta: Secondary | ICD-10-CM

## 2017-04-17 DIAGNOSIS — I71019 Dissection of thoracic aorta, unspecified: Secondary | ICD-10-CM

## 2017-04-18 ENCOUNTER — Telehealth: Payer: Self-pay | Admitting: Cardiothoracic Surgery

## 2017-04-18 ENCOUNTER — Encounter: Payer: Medicare HMO | Admitting: Cardiothoracic Surgery

## 2017-04-25 ENCOUNTER — Other Ambulatory Visit: Payer: Self-pay | Admitting: Physician Assistant

## 2017-04-25 NOTE — Telephone Encounter (Signed)
Medication Detail    Disp Refills Start End   furosemide (LASIX) 40 MG tablet 180 tablet 2 11/06/2016    Sig: TAKE 1 TABLET (40 MG TOTAL) BY MOUTH 2 (TWO) TIMES DAILY.   Sent to pharmacy as: furosemide (LASIX) 40 MG tablet   E-Prescribing Status: Receipt confirmed by pharmacy (11/06/2016 1:50 PM EDT)   Pharmacy   [facility] [phone] - [place], [place] - [address]

## 2017-06-26 ENCOUNTER — Other Ambulatory Visit: Payer: Self-pay | Admitting: Cardiology

## 2017-07-18 ENCOUNTER — Ambulatory Visit: Payer: Medicare PPO | Admitting: Cardiothoracic Surgery

## 2017-07-18 ENCOUNTER — Encounter: Payer: Self-pay | Admitting: Cardiothoracic Surgery

## 2017-07-18 ENCOUNTER — Ambulatory Visit
Admission: RE | Admit: 2017-07-18 | Discharge: 2017-07-18 | Disposition: A | Discharge status: Home / Self Care | Payer: Medicare PPO | Source: Ambulatory Visit | Attending: Cardiothoracic Surgery | Admitting: Cardiothoracic Surgery

## 2017-07-18 ENCOUNTER — Other Ambulatory Visit: Payer: Self-pay

## 2017-07-18 VITALS — BP 130/92 | HR 96 | Resp 18 | Ht 63.0 in | Wt 119.0 lb

## 2017-07-18 DIAGNOSIS — Z9889 Other specified postprocedural states: Secondary | ICD-10-CM

## 2017-07-18 DIAGNOSIS — I7100 Dissection of unspecified site of aorta: Secondary | ICD-10-CM

## 2017-07-18 DIAGNOSIS — I719 Aortic aneurysm of unspecified site, without rupture: Secondary | ICD-10-CM

## 2017-07-18 DIAGNOSIS — I71019 Dissection of thoracic aorta, unspecified: Secondary | ICD-10-CM

## 2017-07-18 DIAGNOSIS — I482 Chronic atrial fibrillation, unspecified: Secondary | ICD-10-CM

## 2017-07-18 DIAGNOSIS — I7101 Dissection of thoracic aorta: Secondary | ICD-10-CM

## 2017-07-18 NOTE — Progress Notes (Signed)
PCP is Farris Has, MD Referring Provider is Jake Bathe, MD  Chief Complaint  Patient presents with  . 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 She has had gradual enlargement of the distal arch and descending thoracic aorta but has been deemed to not be a candidate for repeat thoracic aortic surgery at age 83. We'll follow the patient with annual chest x-rays. Her chest x-ray today is stable from last years with a distal aortic enlargement stable at approximate 5 cm. The patient denies chest pain. She has minimal hoarseness. She denies dysphagia.  The patient does not take anticoagulation for her A. fib because of the slowly enlarging arch and descending aneurysm  Patient states her diltiazem makes her feel terrible and will reduce her dose from 240 mg daily to 120 mg daily. She will continue her other medications for blood pressure and atrial fib.  She has not been hospitalized in the past year. She denies any neurologic symptoms. Past Medical History:  Diagnosis Date  . Aortic atherosclerosis (HCC) 06/03/2013  . Aortic dissection (HCC)    a. Type A aortic dissection thoracic repair by Dr. Maren Beach with biologic Bentall procedure 2014. b. has a persistent patent false lumen distally in the proximal descending thoracic aorta being managed medically as she is not a candidate for redo surgery.  . Aortic root aneurysm (HCC)   . Chronic atrial fibrillation (HCC)    a. Not on anticoag due to risk of further bleeding from Stanford type B dissection.  . Chronic atrial flutter (HCC)   . Chronic combined systolic and diastolic CHF (congestive heart failure) (HCC)    a. echo 04/2013: EF 40-45% with diffuse HK, grade 2 DD, mild AI, mild MR, mod LAE, mildly reduced RV function, mild pulm  HTN.  . Essential hypertension   . Hypokalemia 08/06/2013    Past Surgical History:  Procedure Laterality Date  . ASCENDING AORTIC ANEURYSM REPAIR  10/2012  . BENTALL PROCEDURE N/A 11/11/2012   Procedure: BENTALL PROCEDURE;  Surgeon: Kerin Perna, MD;  Location: [facility] OR;  Service: Open Heart Surgery;  Laterality: N/A;  *CIRC ARREST*   . INTRAOPERATIVE TRANSESOPHAGEAL ECHOCARDIOGRAM N/A 11/11/2012   Procedure: INTRAOPERATIVE TRANSESOPHAGEAL ECHOCARDIOGRAM;  Surgeon: Kerin Perna, MD;  Location: [facility] OR;  Service: Open Heart Surgery;  Laterality: N/A;    Family History  Problem Relation Age of Onset  . Heart attack Neg Hx   . Heart disease Neg Hx   . Heart failure Neg Hx   . Hypertension Neg Hx     Social History Social History   Tobacco Use  . Smoking status: Never Smoker  . Smokeless tobacco: Never Used  Substance Use Topics  . Alcohol use: No  . Drug use: No    Current Outpatient Medications  Medication Sig Dispense Refill  . acetaminophen (TYLENOL) 325 MG tablet Take 650 mg by mouth every 6 (six) hours as needed for mild pain.     Marland Kitchen aspirin 81 MG tablet Take 81 mg by mouth daily.    . digoxin (LANOXIN) 0.125 MG tablet Take 1 tablet (125 mcg total) by mouth daily. 90 tablet 3  . furosemide (LASIX) 40 MG tablet TAKE 1 TABLET (40 MG TOTAL) BY MOUTH 2 (TWO) TIMES DAILY. 180 tablet 2  . metoprolol succinate (TOPROL-XL) 50 MG  24 hr tablet TAKE 1 TABLET BY MOUTH DAILY WITH OR IMMEDIATELY FOLLOWING A MEAL 90 tablet 3   No current facility-administered medications for this visit.     No Known Allergies  Review of Systems  Weight stable No falls No abdominal pain No stroke symptoms  BP (!) 130/92   Pulse 96   Resp 18   Ht 5\' 3"  (1.6 m)   Wt 119 lb (54 kg)   SpO2 97% Comment: on RA  BMI 21.08 kg/m  Physical Exam      Exam    General- alert and comfortable   Lungs- clear without rales, wheezes   Cor- rapid irregular A. fib rhythm, no murmur , gallop   Abdomen-  soft, non-tender   Extremities - warm, non-tender, minimal edema   Neuro- oriented, appropriate, no focal weakness  Diagnostic Tests: Chest x-ray stable with mediastinal enlargement from a chronic enlargement of her distal false lumen  Impression: Stable with conservative management Patient not a candidate for repeat open surgery or stent graft procedure for the thoracic aneurysm distal to the site of previous repair.  Plan: Return with chest x-ray in one year. Reduce diltiazem to 120 mg daily because of symptoms of dizziness and weakness  240 mg daily capsule   Mikey BussingPeter Van Trigt III, MD [facility] [phone]

## 2017-08-10 ENCOUNTER — Telehealth: Payer: Self-pay

## 2017-08-10 NOTE — Telephone Encounter (Signed)
[name] daughter called needing a prescription transferred to another pharmacy in [place].  The pharmacy was called and she can how pick it up at the pharmacy requested.

## 2017-10-24 ENCOUNTER — Encounter: Payer: Self-pay | Admitting: Cardiothoracic Surgery

## 2017-10-24 ENCOUNTER — Ambulatory Visit: Payer: Medicare PPO | Admitting: Cardiothoracic Surgery

## 2017-10-24 VITALS — BP 110/75 | HR 125 | Resp 20 | Ht 63.0 in | Wt 118.0 lb

## 2017-10-24 DIAGNOSIS — I7100 Dissection of unspecified site of aorta: Secondary | ICD-10-CM

## 2017-10-24 DIAGNOSIS — I719 Aortic aneurysm of unspecified site, without rupture: Secondary | ICD-10-CM

## 2017-10-24 NOTE — Progress Notes (Signed)
PCP is Farris Has, MD Referring Provider is Jake Bathe, MD  Chief Complaint  Patient presents with  . Thoracic Aortic Aneurysm    Surgical eval, review all records from [facility] with patient and family. HX of Bentall Procedure 10/2012    HPI: Patient returns for scheduled 76-month follow-up Patient was visiting relatives in [facility] [place] and was hospitalized for some shortness of breath and chest discomfort. Echocardiogram and CTA of thoracic aorta were performed Patient has known low EF 25%.  Echo shows moderate MR and moderate AI. The patient is a known atrial flutter-fibrillation.  The patient's biologic- Bentall root repair for the type a dissection is intact but the distal false lumen has dilated and remains approximately 6 cm in diameter at the distal arch proximal descending thoracic aorta.  The patient is not a candidate for major arch and descending thoracic aortic reconstruction due to her advanced age and LV dysfunction.  Patient was given a LifeVest at the [facility] which she is wearing today.  It has not shocked her. The patient was started on Crestor and Avapro for medical therapy of her valvular insufficiency and low EF and aortic disease.  Today she presents with her daughters and looks unchanged.  She denies currently any chest pain or shortness of breath and enjoys being active doing household activities.  She is taking Lasix twice daily which has controlled her edema very well.  I personally reviewed the CTA images of her thoracic aorta and echocardiogram and feel that there is been minimal change in her cardiac status over the past 6 to 12 months.   The patient and her family understand that at her age and degree of LV dysfunction she is not a candidate for elective or emergency surgery of her thoracic aortic disease as she would not survive or benefit from extensive aortic repair which would include hypothermic circulatory arrest. Past Medical History:   Diagnosis Date  . Aortic atherosclerosis (HCC) 06/03/2013  . Aortic dissection (HCC)    a. Type A aortic dissection thoracic repair by Dr. Maren Beach with biologic Bentall procedure 2014. b. has a persistent patent false lumen distally in the proximal descending thoracic aorta being managed medically as she is not a candidate for redo surgery.  . Aortic root aneurysm (HCC)   . Chronic atrial fibrillation (HCC)    a. Not on anticoag due to risk of further bleeding from Stanford type B dissection.  . Chronic atrial flutter (HCC)   . Chronic combined systolic and diastolic CHF (congestive heart failure) (HCC)    a. echo 04/2013: EF 40-45% with diffuse HK, grade 2 DD, mild AI, mild MR, mod LAE, mildly reduced RV function, mild pulm HTN.  . Essential hypertension   . Hypokalemia 08/06/2013    Past Surgical History:  Procedure Laterality Date  . ASCENDING AORTIC ANEURYSM REPAIR  10/2012  . BENTALL PROCEDURE N/A 11/11/2012   Procedure: BENTALL PROCEDURE;  Surgeon: Kerin Perna, MD;  Location: [facility] OR;  Service: Open Heart Surgery;  Laterality: N/A;  *CIRC ARREST*   . INTRAOPERATIVE TRANSESOPHAGEAL ECHOCARDIOGRAM N/A 11/11/2012   Procedure: INTRAOPERATIVE TRANSESOPHAGEAL ECHOCARDIOGRAM;  Surgeon: Kerin Perna, MD;  Location: [facility] OR;  Service: Open Heart Surgery;  Laterality: N/A;    Family History  Problem Relation Age of Onset  . Heart attack Neg Hx   . Heart disease Neg Hx   . Heart failure Neg Hx   . Hypertension Neg Hx     Social History Social History  Tobacco Use  . Smoking status: Never Smoker  . Smokeless tobacco: Never Used  Substance Use Topics  . Alcohol use: No  . Drug use: No    Current Outpatient Medications  Medication Sig Dispense Refill  . acetaminophen (TYLENOL) 325 MG tablet Take 650 mg by mouth every 6 (six) hours as needed for mild pain.     Marland Kitchen aspirin 81 MG tablet Take 81 mg by mouth daily.    . digoxin (LANOXIN) 0.125 MG tablet Take 1 tablet (125 mcg total)  by mouth daily. 90 tablet 3  . furosemide (LASIX) 40 MG tablet TAKE 1 TABLET (40 MG TOTAL) BY MOUTH 2 (TWO) TIMES DAILY. 180 tablet 2  . irbesartan (AVAPRO) 75 MG tablet Take 75 mg by mouth daily.    . metoprolol succinate (TOPROL-XL) 50 MG 24 hr tablet TAKE 1 TABLET BY MOUTH DAILY WITH OR IMMEDIATELY FOLLOWING A MEAL 90 tablet 3  . potassium chloride (KLOR-CON) 20 MEQ packet Take by mouth 2 (two) times daily.    . rosuvastatin (CRESTOR) 5 MG tablet Take 5 mg by mouth daily at 6 PM.     No current facility-administered medications for this visit.     No Known Allergies  Review of Systems  Weight down 2-3 pounds  no pulmonary symptoms No abdominal pain No syncope or headache or change in vision No dental complaints Stable mild ankle edema    BP 110/75   Pulse (!) 125   Resp 20   Ht 5\' 3"  (1.6 m)   Wt 118 lb (53.5 kg)   SpO2 98% Comment: RA  BMI 20.90 kg/m  Physical Exam       Exam    General- alert and comfortable    Neck- no JVD, no cervical adenopathy palpable, no carotid bruit   Lungs- clear without rales, wheezes   Cor- irregular rate and rhythm, 2/6 systolic murmur ,   Abdomen- soft, non-tender   Extremities - warm, non-tender, minimal edema   Neuro- oriented, appropriate, no focal weakness   Diagnostic Tests: Studies from [place] hospital personally reviewed and discussed with patient and family  Impression: Nonischemic cardiomyopathy with EF of 20% Moderate AI and MR Chronic atrial fibrillation-flutter History of aortic dissection with repair of the aortic root with a biologic Bentall procedure which remains intact but distal arch and descending thoracic aorta false lumen has dilated to approximately 6 cm.  Plan: Continue current medical therapy I was clear with the patient and family that with her cardiac disease she may continue to do well for an indefinite period of time but more likely will have progressive symptoms of heart failure and she is at risk  for sudden death due to her thoracic aneurysm L disease or ventricular arrhythmia.  Patient will be referred to her local cardiologist Dr. Anne Fu to assess the need for the LifeVest defibrillator  Mikey Bussing, MD [facility] [phone]

## 2017-10-26 ENCOUNTER — Ambulatory Visit: Payer: Medicare PPO | Admitting: Cardiology

## 2017-10-26 ENCOUNTER — Encounter: Payer: Self-pay | Admitting: Cardiology

## 2017-10-26 VITALS — BP 100/68 | HR 135 | Ht 63.0 in | Wt 116.4 lb

## 2017-10-26 DIAGNOSIS — I7101 Dissection of thoracic aorta: Secondary | ICD-10-CM

## 2017-10-26 DIAGNOSIS — I5042 Chronic combined systolic (congestive) and diastolic (congestive) heart failure: Secondary | ICD-10-CM | POA: Diagnosis not present

## 2017-10-26 DIAGNOSIS — I482 Chronic atrial fibrillation, unspecified: Secondary | ICD-10-CM

## 2017-10-26 DIAGNOSIS — I719 Aortic aneurysm of unspecified site, without rupture: Secondary | ICD-10-CM | POA: Diagnosis not present

## 2017-10-26 DIAGNOSIS — I71019 Dissection of thoracic aorta, unspecified: Secondary | ICD-10-CM

## 2017-10-26 DIAGNOSIS — I7100 Dissection of unspecified site of aorta: Secondary | ICD-10-CM | POA: Diagnosis not present

## 2017-10-26 DIAGNOSIS — I7121 Aneurysm of the ascending aorta, without rupture: Secondary | ICD-10-CM

## 2017-10-26 LAB — BASIC METABOLIC PANEL
BUN/Creatinine Ratio: 16 (ref 12–28)
BUN: 20 mg/dL (ref 8–27)
CO2: 24 mmol/L (ref 20–29)
Calcium: 9.4 mg/dL (ref 8.7–10.3)
Chloride: 101 mmol/L (ref 96–106)
Creatinine, Ser: 1.25 mg/dL — ABNORMAL HIGH (ref 0.57–1.00)
GFR calc Af Amer: 45 mL/min/{1.73_m2} — ABNORMAL LOW (ref >59)
GFR calc non Af Amer: 39 mL/min/{1.73_m2} — ABNORMAL LOW (ref >59)
Glucose: 115 mg/dL — ABNORMAL HIGH (ref 65–99)
Potassium: 4.1 mmol/L (ref 3.5–5.2)
Sodium: 138 mmol/L (ref 134–144)

## 2017-10-26 MED ORDER — IRBESARTAN 75 MG PO TABS: 37.5000 mg | ORAL_TABLET | Freq: Every day | ORAL | 11 refills | Status: DC

## 2017-10-26 MED ORDER — DIGOXIN 125 MCG PO TABS: 0.1250 mg | ORAL_TABLET | Freq: Every day | ORAL | 11 refills | Status: DC

## 2017-10-26 NOTE — Patient Instructions (Signed)
Medication Instructions:  1) DECREASE AVAPRO to 1/2 tablet 2) RESTART DIGOXIN 0.125 mg daily  Labwork: TODAY: BMET  Testing/Procedures: None  Follow-Up: You have an appointment scheduled with Dr. Anne Fu on Friday, November 02, 2017 at 9:00AM. Please arrive 15 minutes prior to your appointment.  Any Other Special Instructions Will Be Listed Below (If Applicable).     If you need a refill on your cardiac medications before your next appointment, please call your pharmacy.

## 2017-10-26 NOTE — Progress Notes (Signed)
Cardiology Office Note:    Date:  10/26/2017   ID:  [name], DOB 1927/09/28, MRN [id number]  PCP:  Farris Has, MD  Cardiologist:  No primary care provider on file.   Referring MD: Farris Has, MD     History of Present Illness:    [name] is a 84 y.o. female with type a aortic dissection with repair by Dr. Maren Beach, bioprosthetic aortic valve, Permanent atrial fibrillation/flutter, false lumen of repair as dilated and remains approximately 6 cm in diameter at the distal arch proximal to the descending thoracic aorta.  She previously had hematoma, intramural hematoma of her aorta and because of this was not felt to be an anticoagulation candidate.  She is not a candidate for major arch and descending thoracic aortic reconstruction due to age and LV dysfunction and her desires.  He was recently visiting some relatives in [facility] [place] and was hospitalized because of shortness of breath and chest discomfort.  There, an echocardiogram and CT scan were performed.  EF has been persistently low and once again was in the 25% range.  Echo showed moderate mitral regurgitation and moderate aortic insufficiency.  She has had atrial fibrillation and atrial flutter that has been very challenging to control mostly secondary to significant hypotension.  She has been wearing a LifeVest that was given her in [place].  She has not had any defibrillations.  She was also given Crestor and Avapro for medical therapy of her valvular insufficiency and low ejection fraction.  She came in to see Dr. Maren Beach with her daughters in she looked unchanged.  She was denying any chest pain any shortness of breath she was active, doing household activities.  She has been taking Lasix twice a day which is controlled her edema quite well.  Dr. Maren Beach was able to review her CT images and felt like there had been minimal change over the last 6-12 months.  After lengthy discussion once again, family and patient  realize that she is not a candidate for elective or emergent surgery of thoracic aortic disease.  They once again agree.  Previously her atrial flutter fibrillation was reasonably controlled with digoxin 0.125 mg once a day, Cardizem CD 240 mg once a day and metoprolol succinate 50 mg once a day.  Baseline weight has been ranging between 116 and 118.  She thinks that she may have had increased stress that prompted her hospitalization in [place] after she discovered some cousins and her family that she has not seen for many many years.  Past Medical History:  Diagnosis Date  . Aortic atherosclerosis (HCC) 06/03/2013  . Aortic dissection (HCC)    a. Type A aortic dissection thoracic repair by Dr. Maren Beach with biologic Bentall procedure 2014. b. has a persistent patent false lumen distally in the proximal descending thoracic aorta being managed medically as she is not a candidate for redo surgery.  . Aortic root aneurysm (HCC)   . Chronic atrial fibrillation (HCC)    a. Not on anticoag due to risk of further bleeding from Stanford type B dissection.  . Chronic atrial flutter (HCC)   . Chronic combined systolic and diastolic CHF (congestive heart failure) (HCC)    a. echo 04/2013: EF 40-45% with diffuse HK, grade 2 DD, mild AI, mild MR, mod LAE, mildly reduced RV function, mild pulm HTN.  . Essential hypertension   . Hypokalemia 08/06/2013    Past Surgical History:  Procedure Laterality Date  . ASCENDING AORTIC ANEURYSM REPAIR  10/2012  .  BENTALL PROCEDURE N/A 11/11/2012   Procedure: BENTALL PROCEDURE;  Surgeon: Kerin Perna, MD;  Location: [facility] OR;  Service: Open Heart Surgery;  Laterality: N/A;  *CIRC ARREST*   . INTRAOPERATIVE TRANSESOPHAGEAL ECHOCARDIOGRAM N/A 11/11/2012   Procedure: INTRAOPERATIVE TRANSESOPHAGEAL ECHOCARDIOGRAM;  Surgeon: Kerin Perna, MD;  Location: [facility] OR;  Service: Open Heart Surgery;  Laterality: N/A;    Current Medications: Current Meds  Medication Sig  .  acetaminophen (TYLENOL) 325 MG tablet Take 650 mg by mouth every 6 (six) hours as needed for mild pain.   Marland Kitchen aspirin 81 MG tablet Take 81 mg by mouth daily.  Marland Kitchen diltiazem (CARDIZEM SR) 120 MG 12 hr capsule Take 120 mg by mouth daily.  . furosemide (LASIX) 40 MG tablet TAKE 1 TABLET (40 MG TOTAL) BY MOUTH 2 (TWO) TIMES DAILY.  Marland Kitchen irbesartan (AVAPRO) 75 MG tablet Take 0.5 tablets (37.5 mg total) by mouth daily.  . metoprolol succinate (TOPROL-XL) 50 MG 24 hr tablet Take 50 mg by mouth daily. Take with or immediately following a meal.  . potassium chloride (KLOR-CON) 20 MEQ packet Take by mouth 2 (two) times daily.  . rosuvastatin (CRESTOR) 5 MG tablet Take 5 mg by mouth daily at 6 PM.  . [DISCONTINUED] irbesartan (AVAPRO) 75 MG tablet Take 75 mg by mouth daily.     Allergies:   Patient has no known allergies.   Social History   Socioeconomic History  . Marital status: Widowed    Spouse name: None  . Number of children: 2  . Years of education: None  . Highest education level: None  Social Needs  . Financial resource strain: None  . Food insecurity - worry: None  . Food insecurity - inability: None  . Transportation needs - medical: None  . Transportation needs - non-medical: None  Occupational History  . None  Tobacco Use  . Smoking status: Never Smoker  . Smokeless tobacco: Never Used  Substance and Sexual Activity  . Alcohol use: No  . Drug use: No  . Sexual activity: No  Other Topics Concern  . None  Social History Narrative   From [place], [place]     Family History: The patient's family history is negative for Heart attack, Heart disease, Heart failure, and Hypertension.  ROS:   Please see the history of present illness.    Occasional anxiety.  No chest pain, no shortness of breath currently, no orthopnea, no PND.  She does not feel her palpitations.  All other systems reviewed and are negative.  EKGs/Labs/Other Studies Reviewed:    The following studies were  reviewed today: Echocardiogram 03/04/13 (following surgery) -EF 20-25%  Echocardiogram in [facility] 2019 -EF 20-25%  EKG:  EKG is  ordered today.  The ekg ordered today demonstrates atrial fibrillation heart rate in the 130s personally viewed  Recent Labs: No results found for requested labs within last 8760 hours.  Recent Lipid Panel No results found for: CHOL, TRIG, HDL, CHOLHDL, VLDL, LDLCALC, LDLDIRECT  Physical Exam:    VS:  BP 100/68   Pulse (!) 135   Ht 5\' 3"  (1.6 m)   Wt 116 lb 6.4 oz (52.8 kg)   BMI 20.62 kg/m     Wt Readings from Last 3 Encounters:  10/26/17 116 lb 6.4 oz (52.8 kg)  10/24/17 118 lb (53.5 kg)  07/18/17 119 lb (54 kg)     GEN: Thin in no acute distress HEENT: Normal NECK: No JVD; No carotid bruits LYMPHATICS: No  lymphadenopathy CARDIAC: irreg tachy, soft systolic murmur, rubs, gallops, wearing life vest RESPIRATORY:  Clear to auscultation without rales, wheezing or rhonchi  ABDOMEN: Soft, non-tender, non-distended MUSCULOSKELETAL:  No edema; No deformity  SKIN: Warm and dry NEUROLOGIC:  Alert and oriented x 3 PSYCHIATRIC:  Normal affect   ASSESSMENT:    1. Dissection of thoracic aorta (HCC)   2. Aortic root aneurysm (HCC)   3. Intramural aortic hematoma (HCC)   4. Chronic atrial fibrillation (HCC)   5. Chronic combined systolic and diastolic CHF (congestive heart failure) (HCC)    PLAN:    In order of problems listed above:  Type B aortic dissection status post Bentall repair with bioprosthetic aortic valve - False lumen of aortic aneurysm continues to be 6 cm which is unchanged, not a candidate for elective or emergent repair.  Appreciate Dr. Maren Beach.  Lengthy family discussion.  Dilated cardiomyopathy - Potentially may be in part from tacky arrhythmia.  Remember, we do not have coronary anatomy assessment due to emergent nature of surgery previously. -Continue to treat medically with care being tended to potential  hypotension as we have seen in the past. - She has not had any defibrillations from her LifeVest.  Given her advanced age, she is not a candidate for permanent defibrillator therapy.  Since her ejection fraction has not changed since 2014, I think it is reasonable to go ahead and discontinue her LifeVest. -I am going to reduce her Avapro dose, irbesartan to 1/2 tablet of 75 mg.  In the past we were unable to use angiotensin receptor blockers or ACE inhibitors because of hypotension.  We may be seeing this again.  I will also add back digoxin 0.125 mg because of cardiomyopathy and her atrial fibrillation with rapid ventricular response.  She is also taking low-dose diltiazem 120 mg CD daily.  I understand that this is usually not utilized in the setting of cardiomyopathy however we have used this in the past to help control her heart rate and it has been successful.  Potassium was low in [place].  She is taking 20 mEq twice a day.  She is also taking Lasix 40 mg twice a day.  Her weight is now stable.  No edema.  I agree with Dr. Maren Beach that she looks the same as she did a year ago.   Moderate aortic insufficiency and mitral regurgitation -Continue with medical management.  Atrial fibrillation/flutter -Has been difficult to control in the past.  She is not a candidate for anticoagulation because of hematoma previously in the aorta.  This is been present for many years post surgery. Adding back digoxin 125, taking metoprolol 50once a day and taking diltiazem 120 daily.  Cutting back on irbesartan checking lab work.  We will see her back next week.    Medication Adjustments/Labs and Tests Ordered: Current medicines are reviewed at length with the patient today.  Concerns regarding medicines are outlined above.  Orders Placed This Encounter  Procedures  . Basic metabolic panel  . EKG 12-Lead   Meds ordered this encounter  Medications  . digoxin (LANOXIN) 0.125 MG tablet    Sig: Take 1 tablet  (0.125 mg total) by mouth daily.    Dispense:  30 tablet    Refill:  11  . irbesartan (AVAPRO) 75 MG tablet    Sig: Take 0.5 tablets (37.5 mg total) by mouth daily.    Dispense:  15 tablet    Refill:  [address]    Signed, Donato Schultz,  MD  10/26/2017 9:25 AM    [facility]

## 2017-11-02 ENCOUNTER — Ambulatory Visit: Payer: Medicare PPO | Admitting: Cardiology

## 2017-11-02 ENCOUNTER — Encounter: Payer: Self-pay | Admitting: Cardiology

## 2017-11-02 VITALS — BP 110/74 | HR 102 | Ht 63.0 in | Wt 111.1 lb

## 2017-11-02 DIAGNOSIS — I482 Chronic atrial fibrillation: Secondary | ICD-10-CM | POA: Diagnosis not present

## 2017-11-02 DIAGNOSIS — I7121 Aneurysm of the ascending aorta, without rupture: Secondary | ICD-10-CM

## 2017-11-02 DIAGNOSIS — I4892 Unspecified atrial flutter: Secondary | ICD-10-CM | POA: Diagnosis not present

## 2017-11-02 DIAGNOSIS — I719 Aortic aneurysm of unspecified site, without rupture: Secondary | ICD-10-CM

## 2017-11-02 DIAGNOSIS — I71019 Dissection of thoracic aorta, unspecified: Secondary | ICD-10-CM

## 2017-11-02 DIAGNOSIS — I4821 Permanent atrial fibrillation: Secondary | ICD-10-CM

## 2017-11-02 DIAGNOSIS — I7101 Dissection of thoracic aorta: Secondary | ICD-10-CM | POA: Diagnosis not present

## 2017-11-02 NOTE — Progress Notes (Signed)
Cardiology Office Note:    Date:  11/02/2017   ID:  [name], DOB 1928-06-28, MRN [id number]  PCP:  Farris Has, MD  Cardiologist:  No primary care provider on file.   Referring MD: Farris Has, MD     History of Present Illness:    [name] is a 84 y.o. female with type a aortic dissection with repair by Dr. Maren Beach, bioprosthetic aortic valve, Permanent atrial fibrillation/flutter, false lumen of repair as dilated and remains approximately 6 cm in diameter at the distal arch proximal to the descending thoracic aorta.  She previously had hematoma, intramural hematoma of her aorta and because of this was not felt to be an anticoagulation candidate.  She is not a candidate for major arch and descending thoracic aortic reconstruction due to age and LV dysfunction and her desires.  He was recently visiting some relatives in [facility] [place] and was hospitalized because of shortness of breath and chest discomfort.  There, an echocardiogram and CT scan were performed.  EF has been persistently low and once again was in the 25% range.  Echo showed moderate mitral regurgitation and moderate aortic insufficiency.  She has had atrial fibrillation and atrial flutter that has been very challenging to control mostly secondary to significant hypotension.  She has been wearing a LifeVest that was given her in [place].  She has not had any defibrillations.  She was also given Crestor and Avapro for medical therapy of her valvular insufficiency and low ejection fraction.  She came in to see Dr. Maren Beach with her daughters in she looked unchanged.  She was denying any chest pain any shortness of breath she was active, doing household activities.  She has been taking Lasix twice a day which is controlled her edema quite well.  Dr. Maren Beach was able to review her CT images and felt like there had been minimal change over the last 6-12 months.  After lengthy discussion once again, family and patient  realize that she is not a candidate for elective or emergent surgery of thoracic aortic disease.  They once again agree.  Previously her atrial flutter fibrillation was reasonably controlled with digoxin 0.125 mg once a day, Cardizem CD 240 mg once a day and metoprolol succinate 50 mg once a day.  Baseline weight has been ranging between 116 and 118.  She thinks that she may have had increased stress that prompted her hospitalization in [place] after she discovered some cousins and her family that she has not seen for many many years.  11/02/17-she is here for one-week follow-up for rapid atrial fibrillation.  Her heart rate currently is 102 bpm on ECG in atrial fibrillation.  She is under better rate control from 135 previously.  I added back her low-dose digoxin  Past Medical History:  Diagnosis Date  . Aortic atherosclerosis (HCC) 06/03/2013  . Aortic dissection (HCC)    a. Type A aortic dissection thoracic repair by Dr. Maren Beach with biologic Bentall procedure 2014. b. has a persistent patent false lumen distally in the proximal descending thoracic aorta being managed medically as she is not a candidate for redo surgery.  . Aortic root aneurysm (HCC)   . Chronic atrial fibrillation (HCC)    a. Not on anticoag due to risk of further bleeding from Stanford type B dissection.  . Chronic atrial flutter (HCC)   . Chronic combined systolic and diastolic CHF (congestive heart failure) (HCC)    a. echo 04/2013: EF 40-45% with diffuse HK, grade 2  DD, mild AI, mild MR, mod LAE, mildly reduced RV function, mild pulm HTN.  . Essential hypertension   . Hypokalemia 08/06/2013    Past Surgical History:  Procedure Laterality Date  . ASCENDING AORTIC ANEURYSM REPAIR  10/2012  . BENTALL PROCEDURE N/A 11/11/2012   Procedure: BENTALL PROCEDURE;  Surgeon: Kerin Perna, MD;  Location: [facility] OR;  Service: Open Heart Surgery;  Laterality: N/A;  *CIRC ARREST*   . INTRAOPERATIVE TRANSESOPHAGEAL ECHOCARDIOGRAM N/A  11/11/2012   Procedure: INTRAOPERATIVE TRANSESOPHAGEAL ECHOCARDIOGRAM;  Surgeon: Kerin Perna, MD;  Location: [facility] OR;  Service: Open Heart Surgery;  Laterality: N/A;    Current Medications: Current Meds  Medication Sig  . acetaminophen (TYLENOL) 325 MG tablet Take 650 mg by mouth every 6 (six) hours as needed for mild pain.   Marland Kitchen aspirin 81 MG tablet Take 81 mg by mouth daily.  . digoxin (LANOXIN) 0.125 MG tablet Take 1 tablet (0.125 mg total) by mouth daily.  Marland Kitchen diltiazem (CARDIZEM SR) 120 MG 12 hr capsule Take 120 mg by mouth daily.  . furosemide (LASIX) 40 MG tablet TAKE 1 TABLET (40 MG TOTAL) BY MOUTH 2 (TWO) TIMES DAILY.  Marland Kitchen irbesartan (AVAPRO) 75 MG tablet Take 0.5 tablets (37.5 mg total) by mouth daily.  . metoprolol succinate (TOPROL-XL) 50 MG 24 hr tablet Take 50 mg by mouth daily. Take with or immediately following a meal.  . potassium chloride (KLOR-CON) 20 MEQ packet Take by mouth 2 (two) times daily.  . rosuvastatin (CRESTOR) 5 MG tablet Take 5 mg by mouth daily at 6 PM.     Allergies:   Patient has no known allergies.   Social History   Socioeconomic History  . Marital status: Widowed    Spouse name: Not on file  . Number of children: 2  . Years of education: Not on file  . Highest education level: Not on file  Occupational History  . Not on file  Social Needs  . Financial resource strain: Not on file  . Food insecurity:    Worry: Not on file    Inability: Not on file  . Transportation needs:    Medical: Not on file    Non-medical: Not on file  Tobacco Use  . Smoking status: Never Smoker  . Smokeless tobacco: Never Used  Substance and Sexual Activity  . Alcohol use: No  . Drug use: No  . Sexual activity: Never  Lifestyle  . Physical activity:    Days per week: Not on file    Minutes per session: Not on file  . Stress: Not on file  Relationships  . Social connections:    Talks on phone: Not on file    Gets together: Not on file    Attends religious  service: Not on file    Active member of club or organization: Not on file    Attends meetings of clubs or organizations: Not on file    Relationship status: Not on file  Other Topics Concern  . Not on file  Social History Narrative   From [place], [place]     Family History: The patient's family history is negative for Heart attack, Heart disease, Heart failure, and Hypertension.  ROS:   Please see the history of present illness.    No bleeding, no syncope, no dizziness. No CP, no SOB.   EKGs/Labs/Other Studies Reviewed:    The following studies were reviewed today: Echocardiogram 03/04/13 (following surgery) -EF 20-25%  Echocardiogram in [facility]  2019 -EF 20-25%  EKG:  EKG is  ordered today.  The ekg ordered today demonstrates 11/02/17-atrial fibrillation heart rate 102 bpm improved from prior 130 atrial fibrillation heart rate in the 130s personally viewed  Recent Labs: 10/26/2017: BUN 20; Creatinine, Ser 1.25; Potassium 4.1; Sodium 138  Recent Lipid Panel No results found for: CHOL, TRIG, HDL, CHOLHDL, VLDL, LDLCALC, LDLDIRECT  Physical Exam:    VS:  BP 110/74   Pulse (!) 102   Ht 5\' 3"  (1.6 m)   Wt 111 lb 1.9 oz (50.4 kg)   SpO2 99%   BMI 19.68 kg/m     Wt Readings from Last 3 Encounters:  11/02/17 111 lb 1.9 oz (50.4 kg)  10/26/17 116 lb 6.4 oz (52.8 kg)  10/24/17 118 lb (53.5 kg)     GEN: Thin, in no acute distress  HEENT: normal  Neck: no JVD, carotid bruits, or masses Cardiac: irreg mildly tachy; no murmurs, rubs, or gallops,no edema  Respiratory:  clear to auscultation bilaterally, normal work of breathing GI: soft, nontender, nondistended, + BS MS: no deformity or atrophy  Skin: warm and dry, no rash Neuro:  Alert and Oriented x 3, Strength and sensation are intact Psych: euthymic mood, full affect   ASSESSMENT:    1. Chronic atrial flutter (HCC)   2. Aortic root aneurysm (HCC)   3. Dissection of thoracic aorta (HCC)   4. Permanent  atrial fibrillation (HCC)    PLAN:    In order of problems listed above:  Type B aortic dissection status post Bentall repair with bioprosthetic aortic valve - False lumen of aortic aneurysm continues to be 6 cm which is unchanged, not a candidate for elective or emergent repair.  Appreciate Dr. Maren Beach.  Lengthy family discussion.  Dilated cardiomyopathy - Potentially may be in part from tachy arrhythmia.  Remember, we do not have coronary anatomy assessment due to emergent nature of surgery previously. -Continue to treat medically with care being tended to potential hypotension as we have seen in the past. - At last visit discontinue her LifeVest. -Reduced her Avapro dose, irbesartan to 1/2 tablet of 75 mg.  In the past we were unable to use angiotensin receptor blockers or ACE inhibitors because of hypotension.  We may be seeing this again.   -Added back digoxin 0.125 mg at last visit because of cardiomyopathy and her atrial fibrillation with rapid ventricular response.  She is also taking low-dose diltiazem 120 mg CD daily.  I understand that this is usually not utilized in the setting of cardiomyopathy however we have used this in the past to help control her heart rate and it has been successful.  Potassium was low in [place].  She is taking 20 mEq twice a day.  She is also taking Lasix 40 mg twice a day.  Her weight is now stable.  No edema.  I agree with Dr. Maren Beach that she looks the same as she did a year ago.  - Better rate control.    Moderate aortic insufficiency and mitral regurgitation -Continue with medical management. No change  Atrial fibrillation/flutter -Has been difficult to control in the past.  Improved with addition of dig as she was on in the past. She is not a candidate for anticoagulation because of hematoma previously in the aorta.  This is been present for many years post surgery. Digoxin 125, taking metoprolol 50once a day and taking diltiazem 120 daily.  Cutting  back on irbesartan checking lab work.  We  will see her back 3 months.     Medication Adjustments/Labs and Tests Ordered: Current medicines are reviewed at length with the patient today.  Concerns regarding medicines are outlined above.  Orders Placed This Encounter  Procedures  . EKG 12-Lead   No orders of the defined types were placed in this encounter.   Signed, Donato Schultz, MD  11/02/2017 9:32 AM    [facility]

## 2017-11-02 NOTE — Patient Instructions (Signed)
Medication Instructions:  The current medical regimen is effective;  continue present plan and medications.  Follow-Up: Follow up in 3 months with Dr Skains.  If you need a refill on your cardiac medications before your next appointment, please call your pharmacy.  Thank you for choosing [facility]!!     

## 2017-11-14 ENCOUNTER — Other Ambulatory Visit: Payer: Self-pay | Admitting: *Deleted

## 2017-11-14 MED ORDER — POTASSIUM CHLORIDE 20 MEQ PO PACK: 20.0000 meq | PACK | Freq: Two times a day (BID) | ORAL | 3 refills | Status: DC

## 2017-11-16 ENCOUNTER — Ambulatory Visit: Payer: Medicare PPO | Admitting: Cardiology

## 2017-11-16 DIAGNOSIS — R309 Painful micturition, unspecified: Secondary | ICD-10-CM | POA: Diagnosis not present

## 2017-11-16 DIAGNOSIS — N3000 Acute cystitis without hematuria: Secondary | ICD-10-CM | POA: Diagnosis not present

## 2017-11-16 DIAGNOSIS — R000 Tachycardia, unspecified: Secondary | ICD-10-CM | POA: Diagnosis not present

## 2017-11-23 ENCOUNTER — Other Ambulatory Visit: Payer: Self-pay | Admitting: Cardiology

## 2017-11-26 ENCOUNTER — Telehealth: Payer: Self-pay | Admitting: Cardiology

## 2017-11-26 NOTE — Telephone Encounter (Signed)
[facility] pharmacy on [address] is requesting a refill on Rosuvastatin 5 mg tablet. Would Dr. Anne Fu like to refill this medication? Please advise

## 2017-11-26 NOTE — Telephone Encounter (Signed)
OK to refill

## 2017-11-28 MED ORDER — ROSUVASTATIN CALCIUM 5 MG PO TABS: 5.0000 mg | ORAL_TABLET | Freq: Every day | ORAL | 3 refills | Status: DC

## 2017-11-28 NOTE — Telephone Encounter (Signed)
Pt's medication has been sent to pt's pharmacy as requested. Confirmation received.  

## 2017-12-20 ENCOUNTER — Other Ambulatory Visit: Payer: Self-pay | Admitting: Cardiology

## 2018-01-01 ENCOUNTER — Other Ambulatory Visit: Payer: Self-pay | Admitting: Cardiology

## 2018-01-01 MED ORDER — METOPROLOL SUCCINATE ER 50 MG PO TB24: 50.0000 mg | ORAL_TABLET | Freq: Every day | ORAL | 2 refills | Status: DC

## 2018-01-29 ENCOUNTER — Other Ambulatory Visit: Payer: Self-pay | Admitting: Cardiothoracic Surgery

## 2018-01-29 DIAGNOSIS — I719 Aortic aneurysm of unspecified site, without rupture: Secondary | ICD-10-CM

## 2018-01-29 DIAGNOSIS — I7121 Aneurysm of the ascending aorta, without rupture: Secondary | ICD-10-CM

## 2018-01-30 ENCOUNTER — Other Ambulatory Visit: Payer: Self-pay

## 2018-01-30 ENCOUNTER — Ambulatory Visit: Payer: Medicare HMO | Admitting: Cardiothoracic Surgery

## 2018-01-30 ENCOUNTER — Ambulatory Visit
Admission: RE | Admit: 2018-01-30 | Discharge: 2018-01-30 | Disposition: A | Discharge status: Home / Self Care | Payer: Medicare HMO | Source: Ambulatory Visit | Attending: Cardiothoracic Surgery | Admitting: Cardiothoracic Surgery

## 2018-01-30 ENCOUNTER — Encounter: Payer: Self-pay | Admitting: Cardiothoracic Surgery

## 2018-01-30 VITALS — BP 120/64 | HR 88 | Resp 16 | Ht 63.0 in | Wt 108.0 lb

## 2018-01-30 DIAGNOSIS — I482 Chronic atrial fibrillation, unspecified: Secondary | ICD-10-CM

## 2018-01-30 DIAGNOSIS — I4892 Unspecified atrial flutter: Secondary | ICD-10-CM

## 2018-01-30 DIAGNOSIS — I712 Thoracic aortic aneurysm, without rupture: Secondary | ICD-10-CM | POA: Diagnosis not present

## 2018-01-30 DIAGNOSIS — I7101 Dissection of thoracic aorta: Secondary | ICD-10-CM

## 2018-01-30 DIAGNOSIS — I7121 Aneurysm of the ascending aorta, without rupture: Secondary | ICD-10-CM

## 2018-01-30 DIAGNOSIS — I719 Aortic aneurysm of unspecified site, without rupture: Secondary | ICD-10-CM

## 2018-01-30 DIAGNOSIS — I71019 Dissection of thoracic aorta, unspecified: Secondary | ICD-10-CM

## 2018-01-30 NOTE — Progress Notes (Signed)
PCP is Farris Has, MD Referring Provider is Jake Bathe, MD  Chief Complaint  Patient presents with  . Thoracic Aortic Dissection    s/p repair 2014 f/u with cxray today    HPI: Patient returns for scheduled follow-up chest x-ray. Patient had a biologic Bentall root replacement and replacement of the ascending aorta 5 years ago for type a dissection.  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  Because of her advanced age and reduced ejection fraction she is not a candidate for repeat cardiac surgery which would entail complete arch and descending thoracic aortic replacement from which she could not recover.  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  It is large but it is asymptomatic.  She is in chronic atrial flutter-fibrillation and does not take anticoagulation because of her chronic type B dissection. Her symptoms of heart failure, atrial arrhythmias, and hypertension have been very well managed by her cardiologist Dr. Donato Schultz.  Today she denies any new symptoms.  She denies chest pain.  She has had no syncope.  She has not been rehospitalized since last visit.   Past Medical History:  Diagnosis Date  . Aortic atherosclerosis (HCC) 06/03/2013  . Aortic dissection (HCC)    a. Type A aortic dissection thoracic repair by Dr. Maren Beach with biologic Bentall procedure 2014. b. has a persistent patent false lumen distally in the proximal descending thoracic aorta being managed medically as she is not a candidate for redo surgery.  . Aortic root aneurysm (HCC)   . Chronic atrial fibrillation (HCC)    a. Not on anticoag due to risk of further bleeding from Stanford type B dissection.  . Chronic atrial flutter (HCC)   . Chronic combined systolic and diastolic CHF (congestive heart failure) (HCC)    a. echo 04/2013: EF 40-45% with diffuse HK, grade 2 DD, mild AI, mild MR, mod LAE, mildly reduced RV  function, mild pulm HTN.  . Essential hypertension   . Hypokalemia 08/06/2013    Past Surgical History:  Procedure Laterality Date  . ASCENDING AORTIC ANEURYSM REPAIR  10/2012  . BENTALL PROCEDURE N/A 11/11/2012   Procedure: BENTALL PROCEDURE;  Surgeon: Kerin Perna, MD;  Location: [facility] OR;  Service: Open Heart Surgery;  Laterality: N/A;  *CIRC ARREST*   . INTRAOPERATIVE TRANSESOPHAGEAL ECHOCARDIOGRAM N/A 11/11/2012   Procedure: INTRAOPERATIVE TRANSESOPHAGEAL ECHOCARDIOGRAM;  Surgeon: Kerin Perna, MD;  Location: [facility] OR;  Service: Open Heart Surgery;  Laterality: N/A;    Family History  Problem Relation Age of Onset  . Heart attack Neg Hx   . Heart disease Neg Hx   . Heart failure Neg Hx   . Hypertension Neg Hx     Social History Social History   Tobacco Use  . Smoking status: Never Smoker  . Smokeless tobacco: Never Used  Substance Use Topics  . Alcohol use: No  . Drug use: No    Current Outpatient Medications  Medication Sig Dispense Refill  . acetaminophen (TYLENOL) 325 MG tablet Take 650 mg by mouth every 6 (six) hours as needed for mild pain.     Marland Kitchen aspirin 81 MG tablet Take 81 mg by mouth daily.    . digoxin (LANOXIN) 0.125 MG tablet Take 1 tablet (0.125 mg total) by mouth daily. 30 tablet 11  . diltiazem (CARDIZEM SR) 120 MG 12 hr capsule Take 120 mg by mouth daily.    . furosemide (LASIX) 40  MG tablet TAKE 1 TABLET (40 MG TOTAL) BY MOUTH 2 (TWO) TIMES DAILY. 180 tablet 2  . irbesartan (AVAPRO) 75 MG tablet Take 0.5 tablets (37.5 mg total) by mouth daily. 15 tablet 11  . KLOR-CON M20 20 MEQ tablet TAKE 1 TABLET TWICE A DAY 60 tablet 10  . metoprolol succinate (TOPROL-XL) 50 MG 24 hr tablet Take 1 tablet (50 mg total) by mouth daily. Take with or immediately following a meal. 90 tablet 2  . rosuvastatin (CRESTOR) 5 MG tablet Take 1 tablet (5 mg total) by mouth daily at 6 PM. 90 tablet 3  . potassium chloride (KLOR-CON) 20 MEQ packet Take 20 mEq by mouth 2 (two) times  daily. (Patient not taking: Reported on 01/30/2018) 60 packet 3   No current facility-administered medications for this visit.     No Known Allergies  Review of Systems  No new symptoms No edema No syncope No orthopnea No bleeding  BP 120/64 (BP Location: Right Arm, Patient Position: Sitting, Cuff Size: Normal)   Pulse 88 Comment: IRREG  Resp 16   Ht 5\' 3"  (1.6 m)   Wt 108 lb (49 kg)   SpO2 98% Comment: ON RA  BMI 19.13 kg/m  Physical Exam Elderly frail 84 year old woman woman no acute distress slightly disoriented but pleasant Lungs clear Heart rate irregular No murmur noted No edema noted Carotid pulses intact  Diagnostic Tests: Chest x-ray shows stable thoracic aortic aneurysm  of the distal arch and proximal thoracic aorta.  No change since 2017  Impression: 84 year old frail very nice woman with history of biologic Bentall aortic root replacement for repair of a type a dissection now with inoperable type B dissection  Plan: I will continue to see the patient intermittently with chest x-ray.  There is no point in doing CT scans because she is not a candidate for surgery.  She knows the importance of continuing her current medications.  She understands that the situation could change suddenly with death from heart failure or extension of aortic dissection.Marland Kitchen  Mikey Bussing, MD [facility] [phone]

## 2018-02-06 ENCOUNTER — Ambulatory Visit: Payer: Medicare PPO | Admitting: Cardiology

## 2018-02-28 ENCOUNTER — Ambulatory Visit: Payer: Medicare PPO | Admitting: Cardiology

## 2018-04-10 ENCOUNTER — Ambulatory Visit: Payer: Medicare HMO | Admitting: Cardiology

## 2018-04-17 ENCOUNTER — Encounter: Payer: Self-pay | Admitting: Cardiology

## 2018-04-17 ENCOUNTER — Ambulatory Visit: Payer: Medicare HMO | Admitting: Cardiology

## 2018-04-17 VITALS — BP 112/80 | HR 52 | Ht 63.0 in | Wt 111.1 lb

## 2018-04-17 DIAGNOSIS — I7100 Dissection of unspecified site of aorta: Secondary | ICD-10-CM | POA: Diagnosis not present

## 2018-04-17 DIAGNOSIS — I10 Essential (primary) hypertension: Secondary | ICD-10-CM | POA: Diagnosis not present

## 2018-04-17 DIAGNOSIS — I7101 Dissection of thoracic aorta: Secondary | ICD-10-CM

## 2018-04-17 DIAGNOSIS — I4892 Unspecified atrial flutter: Secondary | ICD-10-CM | POA: Diagnosis not present

## 2018-04-17 DIAGNOSIS — Z79899 Other long term (current) drug therapy: Secondary | ICD-10-CM

## 2018-04-17 DIAGNOSIS — I71019 Dissection of thoracic aorta, unspecified: Secondary | ICD-10-CM

## 2018-04-17 NOTE — Progress Notes (Signed)
Cardiology Office Note:    Date:  04/17/2018   ID:  [name], DOB 07/19/1928, MRN [id number]  PCP:  Farris Has, MD  Cardiologist:  No primary care provider on file.   Referring MD: Farris Has, MD     History of Present Illness:    [name] is a 84 y.o. female with type a aortic dissection with repair by Dr. Maren Beach, bioprosthetic aortic valve, Permanent atrial fibrillation/flutter, false lumen of repair as dilated and remains approximately 6 cm in diameter at the distal arch proximal to the descending thoracic aorta.  She previously had hematoma, intramural hematoma of her aorta and because of this was not felt to be an anticoagulation candidate.  She is not a candidate for major arch and descending thoracic aortic reconstruction due to age and LV dysfunction and her desires.  He was recently visiting some relatives in [facility] [place] and was hospitalized because of shortness of breath and chest discomfort.  There, an echocardiogram and CT scan were performed.  EF has been persistently low and once again was in the 25% range.  Echo showed moderate mitral regurgitation and moderate aortic insufficiency.  She has had atrial fibrillation and atrial flutter that has been very challenging to control mostly secondary to significant hypotension.  She has been wearing a LifeVest that was given her in [place].  She has not had any defibrillations.  She was also given Crestor and Avapro for medical therapy of her valvular insufficiency and low ejection fraction.  She came in to see Dr. Maren Beach with her daughters in she looked unchanged.  She was denying any chest pain any shortness of breath she was active, doing household activities.  She has been taking Lasix twice a day which is controlled her edema quite well.  Dr. Maren Beach was able to review her CT images and felt like there had been minimal change over the last 6-12 months.  After lengthy discussion once again, family and patient  realize that she is not a candidate for elective or emergent surgery of thoracic aortic disease.  They once again agree.  Previously her atrial flutter fibrillation was reasonably controlled with digoxin 0.125 mg once a day, Cardizem CD 240 mg once a day and metoprolol succinate 50 mg once a day.  Baseline weight has been ranging between 116 and 118.  She thinks that she may have had increased stress that prompted her hospitalization in [place] after she discovered some cousins and her family that she has not seen for many many years.  11/02/17-she is here for one-week follow-up for rapid atrial fibrillation.  Her heart rate currently is 102 bpm on ECG in atrial fibrillation.  She is under better rate control from 135 previously.  I added back her low-dose digoxin  04/17/2018-today she is here with her daughter and son-in-law.  Her daughter got into a [place] [address], before [place] hit the wall.  Has a back brace. Overall she is feeling well, no chest pain, no shortness of breath.  No syncope.  She is not taking her potassium anymore.  She cannot swallow the pills she states.  Too big.  She rarely will feel dizzy anymore.  She is feeling tired at times.  Past Medical History:  Diagnosis Date  . Aortic atherosclerosis (HCC) 06/03/2013  . Aortic dissection (HCC)    a. Type A aortic dissection thoracic repair by Dr. Maren Beach with biologic Bentall procedure 2014. b. has a persistent patent false lumen distally in the  proximal descending thoracic aorta being managed medically as she is not a candidate for redo surgery.  . Aortic root aneurysm (HCC)   . Chronic atrial fibrillation (HCC)    a. Not on anticoag due to risk of further bleeding from Stanford type B dissection.  . Chronic atrial flutter (HCC)   . Chronic combined systolic and diastolic CHF (congestive heart failure) (HCC)    a. echo 04/2013: EF 40-45% with diffuse HK, grade 2 DD, mild AI, mild MR, mod LAE, mildly reduced RV function,  mild pulm HTN.  . Essential hypertension   . Hypokalemia 08/06/2013    Past Surgical History:  Procedure Laterality Date  . ASCENDING AORTIC ANEURYSM REPAIR  10/2012  . BENTALL PROCEDURE N/A 11/11/2012   Procedure: BENTALL PROCEDURE;  Surgeon: Kerin Perna, MD;  Location: [facility] OR;  Service: Open Heart Surgery;  Laterality: N/A;  *CIRC ARREST*   . INTRAOPERATIVE TRANSESOPHAGEAL ECHOCARDIOGRAM N/A 11/11/2012   Procedure: INTRAOPERATIVE TRANSESOPHAGEAL ECHOCARDIOGRAM;  Surgeon: Kerin Perna, MD;  Location: [facility] OR;  Service: Open Heart Surgery;  Laterality: N/A;    Current Medications: Current Meds  Medication Sig  . acetaminophen (TYLENOL) 325 MG tablet Take 650 mg by mouth every 6 (six) hours as needed for mild pain.   Marland Kitchen aspirin 81 MG tablet Take 81 mg by mouth daily.  . digoxin (LANOXIN) 0.125 MG tablet Take 1 tablet (0.125 mg total) by mouth daily.  Marland Kitchen diltiazem (CARDIZEM SR) 120 MG 12 hr capsule Take 120 mg by mouth daily.  . furosemide (LASIX) 40 MG tablet TAKE 1 TABLET (40 MG TOTAL) BY MOUTH 2 (TWO) TIMES DAILY.  Marland Kitchen irbesartan (AVAPRO) 75 MG tablet Take 0.5 tablets (37.5 mg total) by mouth daily.  Marland Kitchen KLOR-CON M20 20 MEQ tablet TAKE 1 TABLET TWICE A DAY  . metoprolol succinate (TOPROL-XL) 50 MG 24 hr tablet Take 1 tablet (50 mg total) by mouth daily. Take with or immediately following a meal.  . rosuvastatin (CRESTOR) 5 MG tablet Take 1 tablet (5 mg total) by mouth daily at 6 PM.     Allergies:   Patient has no known allergies.   Social History   Socioeconomic History  . Marital status: Widowed    Spouse name: Not on file  . Number of children: 2  . Years of education: Not on file  . Highest education level: Not on file  Occupational History  . Not on file  Social Needs  . Financial resource strain: Not on file  . Food insecurity:    Worry: Not on file    Inability: Not on file  . Transportation needs:    Medical: Not on file    Non-medical: Not on file  Tobacco Use  .  Smoking status: Never Smoker  . Smokeless tobacco: Never Used  Substance and Sexual Activity  . Alcohol use: No  . Drug use: No  . Sexual activity: Never  Lifestyle  . Physical activity:    Days per week: Not on file    Minutes per session: Not on file  . Stress: Not on file  Relationships  . Social connections:    Talks on phone: Not on file    Gets together: Not on file    Attends religious service: Not on file    Active member of club or organization: Not on file    Attends meetings of clubs or organizations: Not on file    Relationship status: Not on file  Other Topics Concern  .  Not on file  Social History Narrative   From [place], [place]     Family History: The patient's family history is negative for Heart attack, Heart disease, Heart failure, and Hypertension.  ROS:   Please see the history of present illness.      EKGs/Labs/Other Studies Reviewed:    The following studies were reviewed today: Echocardiogram 03/04/13 (following surgery) -EF 20-25%  Echocardiogram in [facility] 2019 -EF 20-25%  EKG:  EKG is  ordered today.  The ekg ordered today demonstrates 11/02/17-atrial fibrillation heart rate 102 bpm improved from prior 130 atrial fibrillation heart rate in the 130s personally viewed  Recent Labs: 10/26/2017: BUN 20; Creatinine, Ser 1.25; Potassium 4.1; Sodium 138  Recent Lipid Panel No results found for: CHOL, TRIG, HDL, CHOLHDL, VLDL, LDLCALC, LDLDIRECT  Physical Exam:    VS:  BP 112/80   Pulse (!) 52   Ht 5\' 3"  (1.6 m)   Wt 111 lb 1.9 oz (50.4 kg)   SpO2 97%   BMI 19.68 kg/m     Wt Readings from Last 3 Encounters:  04/17/18 111 lb 1.9 oz (50.4 kg)  01/30/18 108 lb (49 kg)  11/02/17 111 lb 1.9 oz (50.4 kg)     GEN: Thin, in no acute distress  HEENT: normal  Neck: no JVD, carotid bruits, or masses Cardiac: irreg normal rate; no murmurs, rubs, or gallops,no edema  Respiratory:  clear to auscultation bilaterally, normal work of  breathing GI: soft, nontender, nondistended, + BS MS: no deformity or atrophy  Skin: warm and dry, no rash Neuro:  Alert and Oriented x 3, Strength and sensation are intact Psych: euthymic mood, full affect    ASSESSMENT:    1. Essential hypertension   2. Long-term use of high-risk medication   3. Chronic atrial flutter (HCC)   4. Intramural aortic hematoma (HCC)   5. Dissection of thoracic aorta (HCC)    PLAN:    In order of problems listed above:  Type B aortic dissection status post Bentall repair with bioprosthetic aortic valve - False lumen of aortic aneurysm continues to be 6 cm which is unchanged, not a candidate for elective or emergent repair.  Appreciate Dr. Maren Beach.  Lengthy family discussion.  Her daughter is present today.  We discussed once again.  Continue with current management.  Dilated cardiomyopathy - Potentially may be in part from tachy arrhythmia.  Remember, we do not have coronary anatomy assessment due to emergent nature of surgery previously. -Continue to treat medically with care being tended to potential hypotension as we have seen in the past. - At last visit discontinue her LifeVest. -Reduced her Avapro dose, irbesartan to 1/2 tablet of 75 mg at prior visit.  In the past we were unable to use angiotensin receptor blockers or ACE inhibitors because of hypotension.  -Added back digoxin 0.125 mg at prior visit because of cardiomyopathy and her atrial fibrillation with rapid ventricular response.  She is also taking low-dose diltiazem 120 mg CD daily.  I understand that this is usually not utilized in the setting of cardiomyopathy however we have used this in the past to help control her heart rate and it has been successful.    Potassium was low in [place].  She was taking 20 mEq twice a day, but she now states that she cannot swallow these.  We are going to check potassium again.  She is also taking Lasix 40 mg twice a day.  Her weight is now stable.  No  edema.  I agree with Dr. Maren Beach that she looks the same as she did a year ago.  - Better rate control.    Moderate aortic insufficiency and mitral regurgitation -Continue with medical management. No change  Atrial fibrillation/flutter -Has been difficult to control in the past.  Improved with addition of dig as she was on in the past. She is not a candidate for anticoagulation because of hematoma previously in the aorta.  This is been present for many years post surgery. Digoxin 125, taking metoprolol 50mg once a day and taking diltiazem 120 daily.    We will see her back 6 months.     Medication Adjustments/Labs and Tests Ordered: Current medicines are reviewed at length with the patient today.  Concerns regarding medicines are outlined above.  Orders Placed This Encounter  Procedures  . Basic metabolic panel   No orders of the defined types were placed in this encounter.   Signed, Donato Schultz, MD  04/17/2018 5:32 PM    [facility]

## 2018-04-17 NOTE — Patient Instructions (Signed)
Medication Instructions:  The current medical regimen is effective;  continue present plan and medications.  Labwork: Please have blood work today (BMP)  Follow-Up: Follow up in 6 months with Dr. Skains.  You will receive a letter in the mail 2 months before you are due.  Please call us when you receive this letter to schedule your follow up appointment.  If you need a refill on your cardiac medications before your next appointment, please call your pharmacy.  Thank you for choosing [facility]!!      

## 2018-04-18 ENCOUNTER — Telehealth: Payer: Self-pay | Admitting: Cardiology

## 2018-04-18 LAB — BASIC METABOLIC PANEL
BUN/Creatinine Ratio: 17 (ref 12–28)
BUN: 16 mg/dL (ref 8–27)
CO2: 24 mmol/L (ref 20–29)
Calcium: 9.4 mg/dL (ref 8.7–10.3)
Chloride: 100 mmol/L (ref 96–106)
Creatinine, Ser: 0.96 mg/dL (ref 0.57–1.00)
GFR calc Af Amer: 61 mL/min/{1.73_m2} (ref >59)
GFR calc non Af Amer: 53 mL/min/{1.73_m2} — ABNORMAL LOW (ref >59)
Glucose: 107 mg/dL — ABNORMAL HIGH (ref 65–99)
Potassium: 3.8 mmol/L (ref 3.5–5.2)
Sodium: 142 mmol/L (ref 134–144)

## 2018-04-18 NOTE — Telephone Encounter (Signed)
Reviewed results of blood work with patient's daughter who states understanding. She will notify the patient to continue to consume foods high in K+.

## 2018-04-18 NOTE — Telephone Encounter (Signed)
New message   Patient's daughter is returning call about blood work.

## 2018-05-14 ENCOUNTER — Other Ambulatory Visit: Payer: Self-pay | Admitting: Cardiothoracic Surgery

## 2018-05-14 DIAGNOSIS — I7121 Aneurysm of the ascending aorta, without rupture: Secondary | ICD-10-CM

## 2018-05-14 DIAGNOSIS — I719 Aortic aneurysm of unspecified site, without rupture: Secondary | ICD-10-CM

## 2018-05-15 ENCOUNTER — Ambulatory Visit: Payer: Medicare HMO | Admitting: Cardiothoracic Surgery

## 2018-06-17 ENCOUNTER — Other Ambulatory Visit: Payer: Self-pay | Admitting: Cardiology

## 2018-07-03 DIAGNOSIS — I8392 Asymptomatic varicose veins of left lower extremity: Secondary | ICD-10-CM | POA: Diagnosis not present

## 2018-07-04 ENCOUNTER — Other Ambulatory Visit: Payer: Self-pay | Admitting: Family Medicine

## 2018-07-04 DIAGNOSIS — M7989 Other specified soft tissue disorders: Secondary | ICD-10-CM

## 2018-07-08 ENCOUNTER — Ambulatory Visit
Admission: RE | Admit: 2018-07-08 | Discharge: 2018-07-08 | Disposition: A | Discharge status: Home / Self Care | Payer: Medicare HMO | Source: Ambulatory Visit | Attending: Family Medicine | Admitting: Family Medicine

## 2018-07-08 DIAGNOSIS — R600 Localized edema: Secondary | ICD-10-CM | POA: Diagnosis not present

## 2018-07-08 DIAGNOSIS — M7989 Other specified soft tissue disorders: Secondary | ICD-10-CM

## 2018-07-16 ENCOUNTER — Other Ambulatory Visit: Payer: Self-pay | Admitting: Cardiothoracic Surgery

## 2018-07-16 DIAGNOSIS — I71019 Dissection of thoracic aorta, unspecified: Secondary | ICD-10-CM

## 2018-07-16 DIAGNOSIS — I7101 Dissection of thoracic aorta: Secondary | ICD-10-CM

## 2018-07-17 ENCOUNTER — Encounter: Payer: Medicare PPO | Admitting: Cardiothoracic Surgery

## 2018-07-17 ENCOUNTER — Encounter: Payer: Medicare HMO | Admitting: Cardiothoracic Surgery

## 2018-10-06 ENCOUNTER — Other Ambulatory Visit: Payer: Self-pay | Admitting: Cardiology

## 2018-10-17 ENCOUNTER — Telehealth: Payer: Self-pay

## 2018-10-17 NOTE — Telephone Encounter (Signed)
Patient's daughter contacted the office with questions about where her mother should go/ call for varicose vein pain.  She stated that about 3 months ago she went to her PCP and an ultrasound was done.  She stated no clots/ the test was negative.  She is stating that now her leg in the area is painful, swollen, and purple.  I advised her to contact the patient's PCP again and let them know that it is now bothering her and painful.  She acknowledged receipt.

## 2018-10-22 ENCOUNTER — Other Ambulatory Visit: Payer: Self-pay | Admitting: Cardiology

## 2018-10-22 NOTE — Telephone Encounter (Signed)
New Message        Patient's daughter is calling  to get a refill on all of her mother's medication due to the virous. Everything we prescibe , pls call to advise

## 2018-10-22 NOTE — Telephone Encounter (Signed)
Follow Up:   Daughter returning [name]'s call from today.

## 2018-10-22 NOTE — Telephone Encounter (Signed)
Called pt daughter and left a vm to callback regarding the pt

## 2018-10-23 MED ORDER — METOPROLOL SUCCINATE ER 50 MG PO TB24: 50.0000 mg | ORAL_TABLET | Freq: Every day | ORAL | 1 refills | Status: DC

## 2018-10-23 MED ORDER — DILTIAZEM HCL ER BEADS 120 MG PO CP24: ORAL_CAPSULE | ORAL | 1 refills | Status: DC

## 2018-10-23 MED ORDER — POTASSIUM CHLORIDE CRYS ER 20 MEQ PO TBCR: 20.0000 meq | EXTENDED_RELEASE_TABLET | Freq: Two times a day (BID) | ORAL | 1 refills | Status: DC

## 2018-10-23 MED ORDER — IRBESARTAN 75 MG PO TABS: 37.5000 mg | ORAL_TABLET | Freq: Every day | ORAL | 1 refills | Status: DC

## 2018-10-23 MED ORDER — ROSUVASTATIN CALCIUM 5 MG PO TABS: 5.0000 mg | ORAL_TABLET | Freq: Every day | ORAL | 1 refills | Status: DC

## 2018-10-23 MED ORDER — DIGOXIN 125 MCG PO TABS: 0.1250 mg | ORAL_TABLET | Freq: Every day | ORAL | 1 refills | Status: DC

## 2018-10-23 MED ORDER — DILTIAZEM HCL ER 120 MG PO CP12: 120.0000 mg | ORAL_CAPSULE | Freq: Every day | ORAL | 1 refills | Status: DC

## 2018-10-23 NOTE — Telephone Encounter (Signed)
Pt's medications were sent to pt's pharmacy as requested. Confirmation received.  

## 2018-12-11 ENCOUNTER — Telehealth: Payer: Self-pay | Admitting: Cardiology

## 2018-12-11 NOTE — Telephone Encounter (Signed)
Please try to schedule her in September.  Thank you

## 2018-12-11 NOTE — Telephone Encounter (Signed)
Spoke with patient's daughter, Burnett Harry, who said that "everything is fine" and that the patient did not need to have her 6 month follow up.  I canceled the appointment that I had made with Georgie Chard for 12/16/2018.

## 2018-12-11 NOTE — Telephone Encounter (Signed)
Spoke with patient's daughter, Irem and scheduled virtual appointment.  I was not able to speak with patient as she was not at her daugher's house at the time.  Irem said that she would make sure that the patient had her vitals ready for visit.  She asked me to call her sister, Burnett Harry, and get the verbal consent. Shelly was not at her home. I will call back later this afternoon to complete my pre-registration.  DPR request was sent the medical records.

## 2018-12-13 ENCOUNTER — Telehealth: Payer: Self-pay | Admitting: Cardiology

## 2018-12-13 NOTE — Telephone Encounter (Signed)
I sent an e-mail to patient's daughter, Burnett Harry, asking to call and schedule an appointment with Dr. Anne Fu for sometime in September, per Avie Arenas, RN.

## 2018-12-16 ENCOUNTER — Telehealth: Payer: Medicare HMO | Admitting: Cardiology

## 2018-12-23 NOTE — Progress Notes (Signed)
Virtual Visit via Video Note   This visit type was conducted due to national recommendations for restrictions regarding the COVID-19 Pandemic (e.g. social distancing) in an effort to limit this patient's exposure and mitigate transmission in our community.  Due to her co-morbid illnesses, this patient is at least at moderate risk for complications without adequate follow up.  This format is felt to be most appropriate for this patient at this time.  All issues noted in this document were discussed and addressed.  A limited physical exam was performed with this format.  Please refer to the patient's chart for her consent to telehealth for [facility].   Date:  12/24/2018   ID:  [name], DOB 1927/08/31, MRN [id number]  Patient Location: Home Provider Location: Home  PCP:  Farris Has, MD  Cardiologist:  Dr. Donato Schultz, MD   Evaluation Performed:  Follow-Up Visit  Chief Complaint: Follow-up for type a aortic dissection, permanent atrial fibrillation/flutter and dilated cardiomyopathy, seen for Dr. Anne Fu  History of Present Illness:    [name] is a 85 y.o. female with a history of Type A aortic dissection with Bentall repair per Dr. 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 She is not a candidate for major arch and descending thoracic aortic reconstruction secondary to age, LV dysfunction and patient preference.  Last year (2019), she was visiting relatives in [facility] and was hospitalized secondary to shortness of breath and chest discomfort.  An echocardiogram and CT scan were performed.  EF had been persistently low and was noted to be once again in the 25% range with  moderate mitral regurgitation and moderate aortic insufficiency.  At hospital discharge, she wore a LifeVest and did not have any defibrillations. She was also placed on Crestor and Avapro for medical therapy of her valvular insufficiency and low LV function.   There have been lengthy discussions between Dr. Anne Fu and Dr. Maren Beach along with family and patient stating that she is not a candidate for elective or emergent surgery of her thoracic aortic disease in which they agree.  She was noted to be previously controlled on digoxin 0.125 mg once per day, diltiazem CD 240 mg daily and Toprol 50 mg daily for atrial fibrillation/flutter with a baseline HR of 116-118 however was noted to be 135bpm on office visit 10/26/2017.  She was seen 1 week later on 11/02/2017 for follow-up of rapid atrial fibrillation and her heart rate was noted to be 102 on ECG and in atrial fibrillation. Her low-dose digoxin was added back to her regimen.  She was then seen in close follow-up 3 months later on 04/17/2018 and appeared to be doing well.  She had no complaints of chest pain, shortness of breath or syncope. She was no longer taking her potassium supplementation due to difficulty with swallowing the big pills. She was not having complaints of dizziness and had intermittent fatigue. There were no medication changes at that time with plans to follow-up with her in 6 months.  She was last seen by Dr. Maren Beach 01/30/2018 in follow-up.  There were no medicine changes.  He will continue to follow the patient intermittently for surveillance chest x-rays however, no repeat CT scans will be performed given that she is not a  surgical candidate.  A long discussion between the patient and family about the possible gradual and/or sudden change in her condition with heart failure or death as a result of a possible extension of aortic dissection is possible.    Today, I am seeing [name] via virtual visit with her daughter at her side.   She reports that she is doing very well from a cardiac perspective.  She has no complaints of anginal symptoms, shortness of breath, LE swelling, orthopnea, PND, dizziness, palpitations or syncope.  She has self reduced her Toprol to 25 mg daily secondary to mild dizziness.  Her BP is better today at 108/74.  HR is 86.  She also self discontinued her potassium as well as her Crestor.  She reported that the potassium pills are too large for her to swallow.  I am concerned given that she takes 40 mg of Lasix twice daily.  We discussed methods for breaking the pill in half or crushing it in applesauce to help with administration.  If she is unable to do this, we will have her come for lab work within the next week to check potassium level.  Otherwise she is doing well with no acute concerning symptoms.  The patient does not have symptoms concerning for COVID-19 infection (fever, chills, cough, or new shortness of breath).   Past Medical History:  Diagnosis Date   Aortic atherosclerosis (HCC) 06/03/2013   Aortic dissection (HCC)    a. Type A aortic dissection thoracic repair by Dr. Maren Beach with biologic Bentall procedure 2014. b. has a persistent patent false lumen distally in the proximal descending thoracic aorta being managed medically as she is not a candidate for redo surgery.   Aortic root aneurysm (HCC)    Chronic atrial fibrillation    a. Not on anticoag due to risk of further bleeding from Stanford type B dissection.   Chronic atrial flutter (HCC)    Chronic combined systolic and diastolic CHF (congestive heart failure) (HCC)    a. echo 04/2013: EF 40-45% with diffuse HK, grade 2 DD, mild AI, mild MR, mod LAE, mildly reduced RV function, mild pulm HTN.   Essential hypertension    Hypokalemia 08/06/2013   Past Surgical History:  Procedure Laterality Date   ASCENDING AORTIC ANEURYSM REPAIR  10/2012   BENTALL PROCEDURE N/A 11/11/2012   Procedure: BENTALL PROCEDURE;  Surgeon: Kerin Perna, MD;  Location: [facility] OR;  Service: Open Heart Surgery;  Laterality: N/A;  *CIRC ARREST*    INTRAOPERATIVE TRANSESOPHAGEAL ECHOCARDIOGRAM N/A 11/11/2012   Procedure: INTRAOPERATIVE TRANSESOPHAGEAL ECHOCARDIOGRAM;  Surgeon: Kerin Perna, MD;  Location: [facility] OR;  Service: Open Heart Surgery;  Laterality: N/A;     Current Meds  Medication Sig   aspirin 81 MG tablet Take 81 mg by mouth daily.   digoxin (LANOXIN) 0.125 MG tablet Take 1 tablet (0.125 mg total) by mouth daily.   diltiazem (CARDIZEM SR) 120 MG 12 hr capsule Take 1 capsule (120 mg total) by mouth daily.   furosemide (LASIX) 40 MG tablet TAKE 1 TABLET BY MOUTH TWICE A DAY   irbesartan (AVAPRO) 75 MG tablet Take 0.5 tablets (37.5 mg total) by mouth daily.   metoprolol succinate (TOPROL-XL) 50 MG 24 hr tablet Take 25 mg by mouth daily. Take with or immediately following a meal.     Allergies:   Patient has no known allergies.   Social History   Tobacco Use   Smoking status: Never Smoker   Smokeless tobacco: Never  Used  Substance Use Topics   Alcohol use: No   Drug use: No     Family Hx: The patient's family history is negative for Heart attack, Heart disease, Heart failure, and Hypertension.  ROS:   Please see the history of present illness.     All other systems reviewed and are negative.  Prior CV studies:   The following studies were reviewed today:  Echocardiogram 03/04/13 (following surgery) -EF 20-25%  Echocardiogram in [facility] 2019 -EF 20-25%  CXR 01/30/2018: FINDINGS: Marked dilation of the ascending and descending thoracic aorta is again demonstrated. The heart is normal in size. The pulmonary vascularity is not engorged. The sternal wires are intact. There is no pleural effusion.  IMPRESSION: Fairly stable appearance of the markedly dilated thoracic aorta. No acute cardiopulmonary abnormality.  Labs/Other Tests and Data Reviewed:    EKG:  No ECG reviewed.  Recent  Labs: 04/17/2018: BUN 16; Creatinine, Ser 0.96; Potassium 3.8; Sodium 142   Recent Lipid Panel No results found for: CHOL, TRIG, HDL, CHOLHDL, LDLCALC, LDLDIRECT  Wt Readings from Last 3 Encounters:  12/24/18 112 lb (50.8 kg)  04/17/18 111 lb 1.9 oz (50.4 kg)  01/30/18 108 lb (49 kg)     Objective:    Vital Signs:  BP 108/74    Pulse 86    Ht  (1.6 m)    Wt 112 lb (50.8 kg)    BMI 19.84 kg/m    VITAL SIGNS:  reviewed GEN:  no acute distress EYES:  sclerae anicteric, EOMI - Extraocular Movements Intact RESPIRATORY:  normal respiratory effort, symmetric expansion CARDIOVASCULAR:  no peripheral edema SKIN:  no rash, lesions or ulcers. MUSCULOSKELETAL:  no obvious deformities. NEURO:  alert and oriented x 3, no obvious focal deficit PSYCH:  normal affect  ASSESSMENT & PLAN:    1.  Type A aortic dissection s/p Bentall repair with bioprosthetic aortic valve 2014: -Has known false lumen of aortic aneurysm that is 6 cm and unchanged on serial CXR, not a candidate for elective or emergent repair per Dr. Maren Beach. -Lengthy discussion with patient and family per Dr. Anne Fu and Dr. Maren Beach about the likelihood of gradual or sudden changes including decompensation and/or death  -No decompensating symptoms  2.  Dilated cardiomyopathy: -Last echocardiogram with LVEF at 25% -No history of coronary anatomy assessment secondary to emergent nature of previous surgery -Continue with medical therapy however limited by chronic hypotension -BP more stable today at 108/74 -Continue digoxin 0.125 mg, diltiazem 120 mg CD daily >>> noted to not be typically used in the setting of cardiomyopathy however has been successful for her heart rate control given her permanent atrial fibrillation -Patient self discontinued potassium supplement secondary to difficulty swallowing and continues to take Lasix 40 mg twice daily>> discussed methods for administration such as breaking the pill in half or crushing  it with applesauce.  Daughter is aware and will have patient restart.  If unable, will need lab work to check potassium level -We will plan for lab work to follow potassium level within 1 week  -Also self reduced her Toprol to 25 mg (half tab) per day secondary to mild dizziness.  Was previously on 50 mg daily.  Continue Toprol at 25 mg daily for now.  HR 86 today.    3.  Moderate aortic insufficiency and mitral regurgitation: -Per echocardiogram, stable with minimal change  4.  Permanent atrial fibrillation/flutter: -Noted to be difficult to control in the past secondary to hypotension which  was noted to be improved with the addition of low-dose digoxin -Not a candidate for anticoagulation secondary to prior hematoma in the aorta which is been present for many years postoperatively -Continue digoxin 0.125 mg, diltiazem 120 mg -Irbesartan decreased to 37.5 mg at last office visit>> continue -Self decreased Toprol to 25 mg daily>> continue this dose   COVID-19 Education: The signs and symptoms of COVID-19 were discussed with the patient and how to seek care for testing (follow up with PCP or arrange E-visit).  The importance of social distancing was discussed today.  Time:   Today, I have spent 30  minutes with the patient with telehealth technology discussing the above problems.     Medication Adjustments/Labs and Tests Ordered: Current medicines are reviewed at length with the patient today.  Concerns regarding medicines are outlined above.   Tests Ordered: No orders of the defined types were placed in this encounter.   Medication Changes: No orders of the defined types were placed in this encounter.   Disposition:  Follow up Dr. Anne Fu in 6 months  Signed, Georgie Chard, NP  12/24/2018 10:59 AM    [facility]

## 2018-12-24 ENCOUNTER — Telehealth (INDEPENDENT_AMBULATORY_CARE_PROVIDER_SITE_OTHER): Payer: Medicare HMO | Admitting: Cardiology

## 2018-12-24 ENCOUNTER — Encounter: Payer: Self-pay | Admitting: Cardiology

## 2018-12-24 ENCOUNTER — Other Ambulatory Visit: Payer: Self-pay

## 2018-12-24 VITALS — BP 108/74 | HR 86 | Ht 63.0 in | Wt 112.0 lb

## 2018-12-24 DIAGNOSIS — I71019 Dissection of thoracic aorta, unspecified: Secondary | ICD-10-CM

## 2018-12-24 DIAGNOSIS — I4821 Permanent atrial fibrillation: Secondary | ICD-10-CM | POA: Diagnosis not present

## 2018-12-24 DIAGNOSIS — I7101 Dissection of thoracic aorta: Secondary | ICD-10-CM

## 2018-12-24 DIAGNOSIS — I5042 Chronic combined systolic (congestive) and diastolic (congestive) heart failure: Secondary | ICD-10-CM

## 2018-12-24 DIAGNOSIS — I420 Dilated cardiomyopathy: Secondary | ICD-10-CM | POA: Diagnosis not present

## 2018-12-24 DIAGNOSIS — I4892 Unspecified atrial flutter: Secondary | ICD-10-CM | POA: Diagnosis not present

## 2018-12-24 DIAGNOSIS — I7121 Aneurysm of the ascending aorta, without rupture: Secondary | ICD-10-CM

## 2018-12-24 DIAGNOSIS — I719 Aortic aneurysm of unspecified site, without rupture: Secondary | ICD-10-CM

## 2018-12-24 DIAGNOSIS — I7100 Dissection of unspecified site of aorta: Secondary | ICD-10-CM

## 2018-12-24 DIAGNOSIS — I509 Heart failure, unspecified: Secondary | ICD-10-CM | POA: Diagnosis not present

## 2018-12-24 MED ORDER — POTASSIUM CHLORIDE CRYS ER 20 MEQ PO TBCR: 20.0000 meq | EXTENDED_RELEASE_TABLET | Freq: Every day | ORAL | 3 refills | Status: DC

## 2018-12-24 NOTE — Patient Instructions (Addendum)
Medication Instructions:  RESUME: Potassium Chloride 20 meq once a day   If you need a refill on your cardiac medications before your next appointment, please call your pharmacy.   Lab work: FUTURE: BMET & CBC IN DEC  If you have labs (blood work) drawn today and your tests are completely normal, you will receive your results only by: Marland Kitchen MyChart Message (if you have MyChart) OR . A paper copy in the mail If you have any lab test that is abnormal or we need to change your treatment, we will call you to review the results.  Testing/Procedures: None  Follow-Up: At [facility], you and your health needs are our priority.  As part of our continuing mission to provide you with exceptional heart care, we have created designated Provider Care Teams.  These Care Teams include your primary Cardiologist (physician) and Advanced Practice Providers (APPs -  Physician Assistants and Nurse Practitioners) who all work together to provide you with the care you need, when you need it. You will need a follow up appointment in 7 months.  Please call our office 2 months in advance to schedule this appointment.  You may see Dr. Anne Fu or one of the following Advanced Practice Providers on your designated Care Team:   Norma Fredrickson, NP Nada Boozer, NP . Georgie Chard, NP  Any Other Special Instructions Will Be Listed Below (If Applicable).

## 2019-04-17 ENCOUNTER — Other Ambulatory Visit: Payer: Self-pay | Admitting: Cardiology

## 2019-07-28 ENCOUNTER — Other Ambulatory Visit: Payer: Medicare HMO

## 2019-08-05 ENCOUNTER — Other Ambulatory Visit: Payer: Self-pay | Admitting: Cardiology

## 2019-08-17 ENCOUNTER — Other Ambulatory Visit: Payer: Self-pay | Admitting: Cardiology

## 2019-08-25 DIAGNOSIS — I272 Pulmonary hypertension, unspecified: Secondary | ICD-10-CM | POA: Diagnosis not present

## 2019-08-25 DIAGNOSIS — I7100 Dissection of unspecified site of aorta: Secondary | ICD-10-CM | POA: Diagnosis not present

## 2019-08-25 DIAGNOSIS — I5022 Chronic systolic (congestive) heart failure: Secondary | ICD-10-CM | POA: Diagnosis not present

## 2019-08-25 DIAGNOSIS — N1831 Chronic kidney disease, stage 3a: Secondary | ICD-10-CM | POA: Diagnosis not present

## 2019-08-25 DIAGNOSIS — I5040 Unspecified combined systolic (congestive) and diastolic (congestive) heart failure: Secondary | ICD-10-CM | POA: Diagnosis not present

## 2019-08-25 DIAGNOSIS — I4819 Other persistent atrial fibrillation: Secondary | ICD-10-CM | POA: Diagnosis not present

## 2019-08-25 DIAGNOSIS — I77810 Thoracic aortic ectasia: Secondary | ICD-10-CM | POA: Diagnosis not present

## 2019-08-25 DIAGNOSIS — I10 Essential (primary) hypertension: Secondary | ICD-10-CM | POA: Diagnosis not present

## 2020-02-14 ENCOUNTER — Other Ambulatory Visit: Payer: Self-pay | Admitting: Cardiology

## 2020-02-24 ENCOUNTER — Other Ambulatory Visit: Payer: Self-pay | Admitting: Cardiology

## 2020-03-02 ENCOUNTER — Other Ambulatory Visit: Payer: Self-pay | Admitting: Cardiothoracic Surgery

## 2020-03-02 DIAGNOSIS — I71019 Dissection of thoracic aorta, unspecified: Secondary | ICD-10-CM

## 2020-03-02 DIAGNOSIS — I7101 Dissection of thoracic aorta: Secondary | ICD-10-CM

## 2020-03-03 ENCOUNTER — Ambulatory Visit: Payer: Medicare HMO | Admitting: Cardiothoracic Surgery

## 2020-03-30 ENCOUNTER — Other Ambulatory Visit: Payer: Self-pay | Admitting: Cardiology

## 2020-04-01 DIAGNOSIS — N1831 Chronic kidney disease, stage 3a: Secondary | ICD-10-CM | POA: Diagnosis not present

## 2020-04-01 DIAGNOSIS — I272 Pulmonary hypertension, unspecified: Secondary | ICD-10-CM | POA: Diagnosis not present

## 2020-04-01 DIAGNOSIS — N3000 Acute cystitis without hematuria: Secondary | ICD-10-CM | POA: Diagnosis not present

## 2020-04-01 DIAGNOSIS — I7100 Dissection of unspecified site of aorta: Secondary | ICD-10-CM | POA: Diagnosis not present

## 2020-04-01 DIAGNOSIS — I4819 Other persistent atrial fibrillation: Secondary | ICD-10-CM | POA: Diagnosis not present

## 2020-04-01 DIAGNOSIS — I10 Essential (primary) hypertension: Secondary | ICD-10-CM | POA: Diagnosis not present

## 2020-04-01 DIAGNOSIS — I420 Dilated cardiomyopathy: Secondary | ICD-10-CM | POA: Diagnosis not present

## 2020-04-01 DIAGNOSIS — I5022 Chronic systolic (congestive) heart failure: Secondary | ICD-10-CM | POA: Diagnosis not present

## 2020-04-01 DIAGNOSIS — Z9889 Other specified postprocedural states: Secondary | ICD-10-CM | POA: Diagnosis not present

## 2020-04-08 ENCOUNTER — Other Ambulatory Visit: Payer: Self-pay | Admitting: Cardiology

## 2020-04-14 ENCOUNTER — Ambulatory Visit: Payer: Medicare HMO | Admitting: Cardiothoracic Surgery

## 2020-04-29 DIAGNOSIS — R55 Syncope and collapse: Secondary | ICD-10-CM | POA: Diagnosis not present

## 2020-04-29 DIAGNOSIS — I10 Essential (primary) hypertension: Secondary | ICD-10-CM | POA: Diagnosis not present

## 2020-04-29 DIAGNOSIS — R399 Unspecified symptoms and signs involving the genitourinary system: Secondary | ICD-10-CM | POA: Diagnosis not present

## 2020-04-29 DIAGNOSIS — R5382 Chronic fatigue, unspecified: Secondary | ICD-10-CM | POA: Diagnosis not present

## 2020-04-30 NOTE — Progress Notes (Signed)
Virtual Visit via Telephone Note   This visit type was conducted due to national recommendations for restrictions regarding the COVID-19 Pandemic (e.g. social distancing) in an effort to limit this patient's exposure and mitigate transmission in our community.  Due to her co-morbid illnesses, this patient is at least at moderate risk for complications without adequate follow up.  This format is felt to be most appropriate for this patient at this time.  The patient did not have access to video technology/had technical difficulties with video requiring transitioning to audio format only (telephone).  All issues noted in this document were discussed and addressed.  No physical exam could be performed with this format.  Please refer to the patient's chart for her  consent to telehealth for [facility]    Date:  05/05/2020   ID:  [name], DOB 21-Jan-1928, MRN [id number] The patient was identified using 2 identifiers.  Patient Location: Home Provider Location: Home Office  PCP:  Farris Has, MD  Cardiologist:  Dr. Anne Fu, MD  Electrophysiologist:  None   Evaluation Performed:  Follow-Up Visit  Chief Complaint: Follow up   History of Present Illness:    [name] is a 86 y.o. female with a history of Type A aortic dissection with Bentall repair per Dr. 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 She is not a candidate for major arch and descending thoracic aortic reconstruction secondary to age, LV dysfunction and patient preference.  In 2019 while visiting [place] an echocardiogram and CT scan were performed for chest pain.  EF had been persistently low and was noted to be once again in the  25% range with moderate mitral regurgitation and moderate aortic insufficiency.  At hospital discharge, she wore a LifeVest and did not have any defibrillations. She was also placed on Crestor and Avapro for medical therapy of her valvular insufficiency and low LV function.    There have been lengthy discussions between Dr. Anne Fu and Dr. Maren Beach along with family and patient stating that she is not a candidate for elective or emergent surgery of her thoracic aortic disease in which they agree.  She was noted to be previously controlled on digoxin 0.125 mg once per day, diltiazem CD 240 mg daily and Toprol 50 mg daily for atrial fibrillation/flutter with a baseline HR of 116-118 was noted to have recurrent episodes of RVR.   She was most recently seen in telemedicine visit 12/24/2018 by myself at which time she had self reduced her Toprol to 25 mg daily due to mild dizziness.  BP was well controlled.  She had also self discontinued her potassium along with her Crestor.  Today I am speaking with Ms. [name] with the assistance of her daughter.  They report she is doing relatively well.  Has been having issues with what sounds like orthostatic hypotension which resulted in a fall several weeks back while getting up to urinate at night.  Otherwise she has been stable during the day with no issues.  BP today is noted to be 118/70.  She has no complaints of chest pain or palpitations.  Given her age, we will adjust her medications slowly.  I have suggested that we reduce her Toprol to 12.5 mg p.o. daily and decrease her Lasix to  40 mg p.o. daily from twice daily dosing.  She was recently seen by her PCP and per lab work was noted to be dehydrated.  They have follow-up lab work today.  This likely is contributing to her nocturnal symptoms.    The patient does not have symptoms concerning for COVID-19 infection (fever, chills, cough, or new shortness of breath).   Past Medical History:  Diagnosis Date  .  Aortic atherosclerosis (HCC) 06/03/2013  . Aortic dissection (HCC)    a. Type A aortic dissection thoracic repair by Dr. Maren Beach with biologic Bentall procedure 2014. b. has a persistent patent false lumen distally in the proximal descending thoracic aorta being managed medically as she is not a candidate for redo surgery.  . Aortic root aneurysm (HCC)   . Chronic atrial fibrillation (HCC)    a. Not on anticoag due to risk of further bleeding from Stanford type B dissection.  . Chronic atrial flutter (HCC)   . Chronic combined systolic and diastolic CHF (congestive heart failure) (HCC)    a. echo 04/2013: EF 40-45% with diffuse HK, grade 2 DD, mild AI, mild MR, mod LAE, mildly reduced RV function, mild pulm HTN.  . Essential hypertension   . Hypokalemia 08/06/2013   Past Surgical History:  Procedure Laterality Date  . ASCENDING AORTIC ANEURYSM REPAIR  10/2012  . BENTALL PROCEDURE N/A 11/11/2012   Procedure: BENTALL PROCEDURE;  Surgeon: Kerin Perna, MD;  Location: [facility] OR;  Service: Open Heart Surgery;  Laterality: N/A;  *CIRC ARREST*   . INTRAOPERATIVE TRANSESOPHAGEAL ECHOCARDIOGRAM N/A 11/11/2012   Procedure: INTRAOPERATIVE TRANSESOPHAGEAL ECHOCARDIOGRAM;  Surgeon: Kerin Perna, MD;  Location: [facility] OR;  Service: Open Heart Surgery;  Laterality: N/A;     Current Meds  Medication Sig  . aspirin 81 MG tablet Take 81 mg by mouth daily.  . digoxin (LANOXIN) 0.125 MG tablet TAKE 1 TABLET BY MOUTH DAILY  . diltiazem (CARDIZEM SR) 120 MG 12 hr capsule TAKE 1 CAPSULE BY MOUTH EVERY DAY  . furosemide (LASIX) 40 MG tablet TAKE 1 TABLET BY MOUTH TWICE A DAY, APPOINTMENT NEEDED FOR FURTHER REFILLS 2nd attempt.  . irbesartan (AVAPRO) 75 MG tablet TAKE 1/2 TABLET ONCE DAILY  . metoprolol succinate (TOPROL-XL) 50 MG 24 hr tablet Take 0.5 tablet by mouth in the am and a 0.5 tablet by mouth in the PM. Take with or immediately following a meal.     Allergies:   Patient has no known allergies.   Social  History   Tobacco Use  . Smoking status: Never Smoker  . Smokeless tobacco: Never Used  Substance Use Topics  . Alcohol use: No  . Drug use: No     Family Hx: The patient's family history is negative for Heart attack, Heart disease, Heart failure, and Hypertension.  ROS:   Please see the history of present illness.     All other systems reviewed and are negative.  Prior CV studies:   The following studies were reviewed today:  Echocardiogram 03/04/13 (following surgery) -EF 20-25%  [facility]ocardiogram in [facility] 2019 -EF 20-25%  CXR 01/30/2018: FINDINGS: Marked dilation of the ascending and descending thoracic aorta is again demonstrated. The heart is normal in size. The pulmonary vascularity is not engorged. The sternal wires are intact. There is no pleural effusion.  IMPRESSION: Fairly stable appearance of the markedly dilated thoracic aorta. No acute cardiopulmonary abnormality.  Labs/Other Tests and Data Reviewed:    EKG:  No ECG reviewed.  Recent Labs:  No results found for requested labs within last 8760 hours.   Recent Lipid Panel No results found for: CHOL, TRIG, HDL, CHOLHDL, LDLCALC, LDLDIRECT  Wt Readings from Last 3 Encounters:  12/24/18 112 lb (50.8 kg)  04/17/18 111 lb 1.9 oz (50.4 kg)  01/30/18 108 lb (49 kg)     Objective:    Vital Signs:  Ht 5\' 3"  (1.6 m)   BMI 19.84 kg/m    VITAL SIGNS:  reviewed GEN:  no acute distress RESPIRATORY:  normal respiratory effort, symmetric expansion NEURO:  alert and oriented x 3, no obvious focal deficit PSYCH:  normal affect  ASSESSMENT & PLAN:    1. Type A aortic dissection s/p Bentall repair with bioprosthetic aortic valve 2014: -Has known false lumen of aortic aneurysm that is 6 cm and unchanged on serial CXR, not a candidate for elective or emergent repair per Dr. . -Lengthy discussion with patient and family per Dr. Maren Beach and Dr. Anne Fu about the likelihood of gradual or  sudden changes including decompensation and/or death  -No decompensating symptoms  2.  Dilated cardiomyopathy: -Last echocardiogram with LVEF at 25% -No history of coronary anatomy assessment secondary to emergent nature of previous surgery -BP more stable today at 118/70 -Continue digoxin 0.125 mg, diltiazem 120 mg CD daily >>> noted to not be typically used in the setting of cardiomyopathy however has been successful for her heart rate control given her permanent atrial fibrillation -Previously self reduced her Toprol to 25 mg per day secondary to mild dizziness>>> will decrease Toprol to 12.5 mg p.o. daily today in the setting of what sounds like orthostatic hypotension with close in person follow-up for more thorough assessment of heart rate with consideration for orthostatic vitals.  We will also reduce Lasix from twice daily dosing to daily.  3.  Moderate aortic insufficiency and mitral regurgitation: -Per echocardiogram, stable with minimal change  4.  Permanent atrial fibrillation/flutter: -Noted to be difficult to control in the past secondary to hypotension which was noted to be improved with the addition of low-dose digoxin -Not a candidate for anticoagulation secondary to prior hematoma in the aorta which is been present for many years postoperatively -Continue digoxin 0.125 mg, diltiazem 120 mg -Irbesartan decreased to 37.5 mg at last office visit>> continue -Self decreased Toprol to 25 mg daily>> continue this dose  5.  Orthostatic hypotension: -BP stable today however daughter reports several episodes of nocturnal dizziness resulting in 1 to fall while getting up to urinate at night.  Was recently seen by PCP noted to be dehydrated.  Will reduce Toprol to 12.5 mg p.o. daily and reduce Lasix to 40 mg p.o. daily from twice daily dosing.  We will have close follow-up in office for possible orthostatic vitals if no improvement.  They report repeat lab work today with PCP.   Encouraged to increase fluid intake.   COVID-19 Education: The signs and symptoms of COVID-19 were discussed with the patient and how to seek care for testing (follow up with PCP or arrange E-visit). The importance of social distancing was discussed today.  Time:   Today, I have spent 25 minutes with the patient with telehealth technology discussing the above problems.     Medication Adjustments/Labs and Tests Ordered: Current medicines are reviewed at length with the patient today.  Concerns regarding medicines are outlined above.   Tests Ordered: No orders of the defined types were placed in this encounter.   Medication Changes: No orders of the defined types were  placed in this encounter.   Follow Up:  In Person Myself in 4 to 6 weeks  Signed, Georgie Chard, NP  05/05/2020 9:20 AM    [facility]

## 2020-05-04 ENCOUNTER — Other Ambulatory Visit: Payer: Self-pay | Admitting: Cardiology

## 2020-05-05 ENCOUNTER — Telehealth (INDEPENDENT_AMBULATORY_CARE_PROVIDER_SITE_OTHER): Payer: Medicare HMO | Admitting: Cardiology

## 2020-05-05 ENCOUNTER — Encounter: Payer: Self-pay | Admitting: Cardiology

## 2020-05-05 ENCOUNTER — Other Ambulatory Visit: Payer: Self-pay

## 2020-05-05 VITALS — Ht 63.0 in

## 2020-05-05 DIAGNOSIS — I4821 Permanent atrial fibrillation: Secondary | ICD-10-CM | POA: Diagnosis not present

## 2020-05-05 DIAGNOSIS — I7100 Dissection of unspecified site of aorta: Secondary | ICD-10-CM | POA: Diagnosis not present

## 2020-05-05 DIAGNOSIS — I5042 Chronic combined systolic (congestive) and diastolic (congestive) heart failure: Secondary | ICD-10-CM | POA: Diagnosis not present

## 2020-05-05 DIAGNOSIS — I71019 Dissection of thoracic aorta, unspecified: Secondary | ICD-10-CM

## 2020-05-05 DIAGNOSIS — I7101 Dissection of thoracic aorta: Secondary | ICD-10-CM | POA: Diagnosis not present

## 2020-05-05 DIAGNOSIS — I951 Orthostatic hypotension: Secondary | ICD-10-CM

## 2020-05-05 MED ORDER — FUROSEMIDE 40 MG PO TABS: 40.0000 mg | ORAL_TABLET | Freq: Every day | ORAL | 3 refills | Status: DC

## 2020-05-05 MED ORDER — METOPROLOL SUCCINATE ER 25 MG PO TB24: 12.5000 mg | ORAL_TABLET | Freq: Every day | ORAL | 3 refills | Status: AC

## 2020-05-05 NOTE — Patient Instructions (Signed)
Medication Instructions:  Your physician has recommended you make the following change in your medication: -- DECREASE Metoprolol (Toprol) to 12.5 mg - Take 0.5 tablet (12.5 mg) by mouth once daily -- NEW RX SENT -- DECREASE Furosemide (Lasix) - Take 1 tablet (40 mg) by mouth daily -- NEW RX SENT *If you need a refill on your cardiac medications before your next appointment, please call your pharmacy*  Follow-Up: At [facility], you and your health needs are our priority.  As part of our continuing mission to provide you with exceptional heart care, we have created designated Provider Care Teams.  These Care Teams include your primary Cardiologist (physician) and Advanced Practice Providers (APPs -  Physician Assistants and Nurse Practitioners) who all work together to provide you with the care you need, when you need it.  We recommend signing up for the patient portal called "MyChart".  Sign up information is provided on this After Visit Summary.  MyChart is used to connect with patients for Virtual Visits (Telemedicine).  Patients are able to view lab/test results, encounter notes, upcoming appointments, etc.  Non-urgent messages can be sent to your provider as well.   To learn more about what you can do with MyChart, go to [email or web address].    Your next appointment:   Your physician recommends that you schedule a follow-up appointment in 4-6 WEEKS with Georgie Chard, NP -- Thursday  06/03/20 at 11:45 am   The format for your next appointment:   In Person with Georgie Chard, NP

## 2020-05-13 DIAGNOSIS — R944 Abnormal results of kidney function studies: Secondary | ICD-10-CM | POA: Diagnosis not present

## 2020-05-18 ENCOUNTER — Other Ambulatory Visit: Payer: Self-pay | Admitting: Cardiology

## 2020-05-19 ENCOUNTER — Ambulatory Visit: Payer: Medicare HMO | Admitting: Cardiothoracic Surgery

## 2020-05-25 NOTE — Progress Notes (Signed)
Cardiology Office Note   Date:  06/03/2020   ID:  [name], DOB [birth year], MRN [id number]  PCP:  Farris Has, MD  Cardiologist:  Dr. Anne Fu, MD   Chief Complaint  Patient presents with  . Follow-up    History of Present Illness: [name] is a 86 y.o. female who presents for follow up, seen for Dr. Anne Fu.   Ms. Amara has a history ofTypeAaortic dissection with Bentallrepair per Dr. Katina Degree aortic [phone]), permanent(difficult to control secondary to hypotension)atrial fibrillation/flutter, distalfalse lumenin the proximal descending thoracic aorta thatremains dilated at approximately 6 cm at the distal arch, previous hematoma of the intramural aorta making her not an anticoagulation candidate. Sheis not a candidate for major arch and descending thoracic aortic reconstruction secondary to age, LV dysfunction and patient preference.  In 2019 while visiting [place] an echocardiogram and CT scan were performed for chest pain. EF had been persistently low and was noted to be once again in the 25% range with moderate mitral regurgitation and moderate aortic insufficiency. At hospital discharge, she wore a LifeVest and did not have any defibrillations. She was also placed on Crestor and Avapro for medical therapy of her valvular insufficiency and low LV function.   There havebeen lengthy discussions between Dr. Anne Fu and Dr. Maren Beach along with family and patient stating that sheisnot a candidate for elective or emergent surgery of her thoracic aortic disease in which they agree.  Shewas noted to be previously controlled on digoxin 0.125 mg once per day, diltiazem CD 240 mg daily and Toprol 50 mg daily for atrial fibrillation/flutter with a baseline HR of 116-118 was noted to have recurrent episodes of RVR.   She was most recently seen in telemedicine visit 12/24/2018 by myself at which time she had self reduced her Toprol to 25 mg daily due  to mild dizziness. She had also self discontinued her potassium along with her Crestor.  In follow-up, she had c/o of what sounded like orthostatic hypotension which resulted in a fall. Otherwise she has been stable during the day with no issues. We decreased her Lasix to 40 mg p.o. daily from twice daily dosing.    Today she presents with her daughter and states that she is no longer having issues with orthostasis and is now sleeping through the night as she is no longer up to urinate every hour. She follows with Dr. Maren Beach in the next week for follow up with imaging. She has no other complaints today. Overall is doing very well.   Past Medical History:  Diagnosis Date  . Aortic atherosclerosis (HCC) 06/03/2013  . Aortic dissection (HCC)    a. Type A aortic dissection thoracic repair by Dr. Maren Beach with biologic Bentall procedure 2014. b. has a persistent patent false lumen distally in the proximal descending thoracic aorta being managed medically as she is not a candidate for redo surgery.  . Aortic root aneurysm (HCC)   . Chronic atrial fibrillation (HCC)    a. Not on anticoag due to risk of further bleeding from Stanford type B dissection.  . Chronic atrial flutter (HCC)   . Chronic combined systolic and diastolic CHF (congestive heart failure) (HCC)    a. echo 04/2013: EF 40-45% with diffuse HK, grade 2 DD, mild AI, mild MR, mod LAE, mildly reduced RV function, mild pulm HTN.  . Essential hypertension   . Hypokalemia 08/06/2013    Past Surgical History:  Procedure Laterality Date  . ASCENDING AORTIC ANEURYSM REPAIR  10/2012  .  BENTALL PROCEDURE N/A 11/11/2012   Procedure: BENTALL PROCEDURE;  Surgeon: Kerin Perna, MD;  Location: [facility] OR;  Service: Open Heart Surgery;  Laterality: N/A;  *CIRC ARREST*   . INTRAOPERATIVE TRANSESOPHAGEAL ECHOCARDIOGRAM N/A 11/11/2012   Procedure: INTRAOPERATIVE TRANSESOPHAGEAL ECHOCARDIOGRAM;  Surgeon: Kerin Perna, MD;  Location: [facility] OR;  Service: Open  Heart Surgery;  Laterality: N/A;     Current Outpatient Medications  Medication Sig Dispense Refill  . aspirin 81 MG tablet Take 81 mg by mouth daily.    . digoxin (LANOXIN) 0.125 MG tablet TAKE 1 TABLET BY MOUTH DAILY 90 tablet 2  . diltiazem (CARDIZEM SR) 120 MG 12 hr capsule TAKE 1 CAPSULE BY MOUTH EVERY DAY 90 capsule 3  . furosemide (LASIX) 40 MG tablet Take 1 tablet (40 mg total) by mouth daily. 90 tablet 3  . irbesartan (AVAPRO) 75 MG tablet TAKE 1/2 TABLET ONCE DAILY 45 tablet 3  . metoprolol succinate (TOPROL-XL) 25 MG 24 hr tablet Take 0.5 tablets (12.5 mg total) by mouth daily. Take with or immediately following a meal. 45 tablet 3   No current facility-administered medications for this visit.    Allergies:   Patient has no known allergies.    Social History:  The patient  reports that she has never smoked. She has never used smokeless tobacco. She reports that she does not drink alcohol and does not use drugs.   Family History:  The patient's family history is not on file.    ROS:  Please see the history of present illness. Otherwise, review of systems are positive for none.   All other systems are reviewed and negative.    PHYSICAL EXAM: VS:  BP 114/78   Pulse (!) 113   Ht 5\' 2"  (1.575 m)   Wt 108 lb (49 kg)   SpO2 96%   BMI 19.75 kg/m  , BMI Body mass index is 19.75 kg/m.   General: Elderly, NAD Neck: Negative for carotid bruits. No JVD Lungs:Clear to ausculation bilaterally.  Cardiovascular: Irregularly irregular Extremities: No edema. Radial pulses 2+ bilaterally Neuro: Alert and oriented. No focal deficits. No facial asymmetry. MAE spontaneously. Psych: Responds to questions appropriately with normal affect.     EKG:  EKG is ordered today. The ekg ordered today demonstrates AF with HR at 113bpm, PVC   Recent Labs: No results found for requested labs within last 8760 hours.    Lipid Panel No results found for: CHOL, TRIG, HDL, CHOLHDL, VLDL,  LDLCALC, LDLDIRECT    Wt Readings from Last 3 Encounters:  06/03/20 108 lb (49 kg)  12/24/18 112 lb (50.8 kg)  04/17/18 111 lb 1.9 oz (50.4 kg)    Other studies Reviewed: Additional studies/ records that were reviewed today include:  Review of the above records demonstrates:   Echocardiogram 03/04/13 (following surgery) -EF 20-25%  Echocardiogram in [facility] 2019 -EF 20-25%  CXR 01/30/2018: FINDINGS: Marked dilation of the ascending and descending thoracic aorta is again demonstrated. The heart is normal in size. The pulmonary vascularity is not engorged. The sternal wires are intact. There is no pleural effusion.  IMPRESSION: Fairly stable appearance of the markedly dilated thoracic aorta. No acute cardiopulmonary abnormality.  ASSESSMENT AND PLAN:  1. Type Aaortic dissection s/pBentall repair with bioprosthetic aortic 02/01/2018: -Has known false lumen of aortic aneurysmthat is6cmandunchanged on serial CXR, not a candidate for elective or emergent repairper Dr. [id number]. -Lengthy discussion with patient and family per Dr. Maren Beach and Dr. Anne Fu about the likelihood  of gradual or sudden changes including decompensation and/or death  -Nodecompensating symptoms  2. Dilated cardiomyopathy: -Last echocardiogramwith LVEF at 25% -No history of coronary anatomy assessment secondary to emergent nature of previous surgery -Continue digoxin 0.125 mg,diltiazem 120 mg CD daily>>>noted to not be typically used in the setting of cardiomyopathy however has been successful for her heart rate control given her permanent atrial fibrillation -Continue Toprol to 12.5 mg p.o. daily  -Continue Lasix 40mg  QD   3. Moderate aortic insufficiency and mitral regurgitation: -Per echocardiogram,stable with minimal change  4. Permanent atrial fibrillation/flutter: -Noted to be difficult to control in the past secondary to hypotension which was noted to be improved with  the addition of low-dose digoxin -Not a candidate for anticoagulation secondary to prior hematoma in the aorta which is been present for many years postoperatively -Continue digoxin 0.125 mg, diltiazem 120 mg -Irbesartan decreased to37.5mg at last office visit>>continue -Continue Toprol 12.5  5.  Orthostatic hypotension: -Resolved -Continue Toprol to 12.5 mg p.o. daily and Lasix 40 mg p.o. daily   Current medicines are reviewed at length with the patient today.  The patient does not have concerns regarding medicines.  The following changes have been made:  no change  Labs/ tests ordered today include: None   Orders Placed This Encounter  Procedures  . EKG 12-Lead     Disposition:   FU with myself  in 6 months  Signed, , NP  06/03/2020 12:50 PM    [facility] [address] [place], [place], [place]  [place] Phone: [phone]; Fax: [phone]

## 2020-06-03 ENCOUNTER — Encounter: Payer: Self-pay | Admitting: Cardiology

## 2020-06-03 ENCOUNTER — Ambulatory Visit: Payer: Medicare HMO | Admitting: Cardiology

## 2020-06-03 ENCOUNTER — Other Ambulatory Visit: Payer: Self-pay

## 2020-06-03 VITALS — BP 114/78 | HR 113 | Ht 62.0 in | Wt 108.0 lb

## 2020-06-03 DIAGNOSIS — I7100 Dissection of unspecified site of aorta: Secondary | ICD-10-CM

## 2020-06-03 DIAGNOSIS — I5042 Chronic combined systolic (congestive) and diastolic (congestive) heart failure: Secondary | ICD-10-CM

## 2020-06-03 DIAGNOSIS — I719 Aortic aneurysm of unspecified site, without rupture: Secondary | ICD-10-CM | POA: Diagnosis not present

## 2020-06-03 DIAGNOSIS — I71019 Dissection of thoracic aorta, unspecified: Secondary | ICD-10-CM

## 2020-06-03 DIAGNOSIS — I4821 Permanent atrial fibrillation: Secondary | ICD-10-CM | POA: Diagnosis not present

## 2020-06-03 DIAGNOSIS — Q2543 Congenital aneurysm of aorta: Secondary | ICD-10-CM

## 2020-06-03 DIAGNOSIS — I7101 Dissection of thoracic aorta: Secondary | ICD-10-CM | POA: Diagnosis not present

## 2020-06-03 DIAGNOSIS — I7121 Aneurysm of the ascending aorta, without rupture: Secondary | ICD-10-CM

## 2020-06-03 NOTE — Patient Instructions (Signed)
Medication Instructions:   Your physician recommends that you continue on your current medications as directed. Please refer to the Current Medication list given to you today.  *If you need a refill on your cardiac medications before your next appointment, please call your pharmacy*   Lab Work: None If you have labs (blood work) drawn today and your tests are completely normal, you will receive your results only by: Marland Kitchen MyChart Message (if you have MyChart) OR . A paper copy in the mail If you have any lab test that is abnormal or we need to change your treatment, we will call you to review the results.   Testing/Procedures: None   Follow-Up: At [facility], you and your health needs are our priority.  As part of our continuing mission to provide you with exceptional heart care, we have created designated Provider Care Teams.  These Care Teams include your primary Cardiologist (physician) and Advanced Practice Providers (APPs -  Physician Assistants and Nurse Practitioners) who all work together to provide you with the care you need, when you need it.  We recommend signing up for the patient portal called "MyChart".  Sign up information is provided on this After Visit Summary.  MyChart is used to connect with patients for Virtual Visits (Telemedicine).  Patients are able to view lab/test results, encounter notes, upcoming appointments, etc.  Non-urgent messages can be sent to your provider as well.   To learn more about what you can do with MyChart, go to [email or web address].    Your next appointment:   6 month(s)  The format for your next appointment:   In Person  Provider:   Georgie Chard, NP

## 2020-06-09 ENCOUNTER — Ambulatory Visit: Payer: Medicare HMO | Admitting: Cardiothoracic Surgery

## 2020-06-09 ENCOUNTER — Ambulatory Visit
Admission: RE | Admit: 2020-06-09 | Discharge: 2020-06-09 | Disposition: A | Discharge status: Home / Self Care | Payer: Medicare HMO | Source: Ambulatory Visit | Attending: Cardiothoracic Surgery | Admitting: Cardiothoracic Surgery

## 2020-06-09 ENCOUNTER — Other Ambulatory Visit: Payer: Self-pay

## 2020-06-09 ENCOUNTER — Encounter: Payer: Self-pay | Admitting: Cardiothoracic Surgery

## 2020-06-09 DIAGNOSIS — I712 Thoracic aortic aneurysm, without rupture: Secondary | ICD-10-CM | POA: Diagnosis not present

## 2020-06-09 DIAGNOSIS — I517 Cardiomegaly: Secondary | ICD-10-CM | POA: Diagnosis not present

## 2020-06-09 DIAGNOSIS — I7101 Dissection of thoracic aorta: Secondary | ICD-10-CM | POA: Diagnosis not present

## 2020-06-09 DIAGNOSIS — I7122 Aneurysm of the aortic arch, without rupture: Secondary | ICD-10-CM | POA: Insufficient documentation

## 2020-06-09 DIAGNOSIS — I71019 Dissection of thoracic aorta, unspecified: Secondary | ICD-10-CM

## 2020-06-09 NOTE — Progress Notes (Signed)
PCP is Farris Has, MD Referring Provider is Jake Bathe, MD  Chief Complaint  Patient presents with  . Follow-up    yearly f/u for type B aortic dissection, cxr today    HPI: The patient returns for routine follow-up with PA and lateral chest x-ray.  She had a repair of a type A ascending aortic dissection 7-1/2 years ago with a 30 mm graft and resuspension of the aortic valve.  She has nonischemic cardiomyopathy with EF approximately 25% by echo 2019.  She has mild AI.  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  Chest x-ray today shows clear lung fields stable cardiac silhouette and stable soft tissue density of the pseudoaneurysm at the arch.   Patient denies chest pain shortness of breath or edema.  Her weight is stable.  Her blood pressure is well controlled on metoprolol, diltiazem, Avapro.  Atrial fibrillation is rate controlled with digoxin and she is on aspirin.  She has excellent functional status lives alone in her apartment and walks to the grocery store and back.  Past Medical History:  Diagnosis Date  . Aortic atherosclerosis (HCC) 06/03/2013  . Aortic dissection (HCC)    a. Type A aortic dissection thoracic repair by Dr. Maren Beach with biologic Bentall procedure 2014. b. has a persistent patent false lumen distally in the proximal descending thoracic aorta being managed medically as she is not a candidate for redo surgery.  . Aortic root aneurysm (HCC)   . Chronic atrial fibrillation (HCC)    a. Not on anticoag due to risk of further bleeding from Stanford type B dissection.  . Chronic atrial flutter (HCC)   . Chronic combined systolic and diastolic CHF (congestive heart failure) (HCC)    a. echo 04/2013: EF 40-45% with diffuse HK, grade 2 DD, mild AI, mild MR, mod LAE, mildly reduced RV function, mild pulm HTN.  . Essential  hypertension   . Hypokalemia 08/06/2013    Past Surgical History:  Procedure Laterality Date  . ASCENDING AORTIC ANEURYSM REPAIR  10/2012  . BENTALL PROCEDURE N/A 11/11/2012   Procedure: BENTALL PROCEDURE;  Surgeon: Kerin Perna, MD;  Location: [facility] OR;  Service: Open Heart Surgery;  Laterality: N/A;  *CIRC ARREST*   . INTRAOPERATIVE TRANSESOPHAGEAL ECHOCARDIOGRAM N/A 11/11/2012   Procedure: INTRAOPERATIVE TRANSESOPHAGEAL ECHOCARDIOGRAM;  Surgeon: Kerin Perna, MD;  Location: [facility];  Service: Open Heart Surgery;  Laterality: N/A;    Family History  Problem Relation Age of Onset  . Heart attack Neg Hx   . Heart disease Neg Hx   . Heart failure Neg Hx   . Hypertension Neg Hx     Social History Social History   Tobacco Use  . Smoking status: Never Smoker  . Smokeless tobacco: Never Used  Substance Use Topics  . Alcohol use: No  . Drug use: No    Current Outpatient Medications  Medication Sig Dispense Refill  . aspirin 81 MG tablet Take 81 mg by mouth daily.    . digoxin (LANOXIN) 0.125 MG tablet TAKE 1 TABLET BY MOUTH DAILY 90 tablet 2  . diltiazem (CARDIZEM SR) 120 MG 12 hr capsule TAKE 1 CAPSULE BY MOUTH EVERY DAY 90 capsule 3  . furosemide (LASIX) 40 MG tablet Take 1 tablet (40 mg total) by mouth daily. 90 tablet 3  . irbesartan (AVAPRO) 75 MG tablet TAKE 1/2 TABLET ONCE  DAILY 45 tablet 3  . metoprolol succinate (TOPROL-XL) 25 MG 24 hr tablet Take 0.5 tablets (12.5 mg total) by mouth daily. Take with or immediately following a meal. 45 tablet 3   No current facility-administered medications for this visit.    No Known Allergies  Review of Systems  No recent hospitalizations No falls Difficulty with nocturia improved by rescheduling Lasix dosing to a.m. only Improvement in orthostatic hypotension by reducing dose of metoprolol  BP 132/82 (BP Location: Right Arm, Patient Position: Sitting)   Pulse 78   Resp 20   Ht 5\' 2"  (1.575 m)   Wt 110 lb (49.9 kg)   SpO2  97%   BMI 20.12 kg/m  Physical Exam     Physical Exam  General: Elderly small fragile female no acute distress, age 86 HEENT: Normocephalic pupils equal , dentition adequate Neck: Supple without JVD, adenopathy, or bruit Chest: Clear to auscultation, symmetrical breath sounds, no rhonchi, no tenderness             or deformity Cardiovascular: Atrial fibrillation, soft 1/6 holosystolic murmur, no gallop, peripheral pulses             palpable in all extremities Abdomen:  Soft, nontender, no palpable mass or organomegaly Extremities: Warm, well-perfused, no clubbing cyanosis edema or tenderness,              no venous stasis changes of the legs Rectal/GU: Deferred Neuro: Grossly non--focal and symmetrical throughout Skin: Clean and dry without rash or ulceration   Diagnostic Tests: Chest x-ray image  today personally reviewed showing no change from 2 years ago.  Soft tissue density of the pseudoaneurysm remains unchanged   Impression: History of repair of type a dissection with persistent false lumen of the distal arch and descending thoracic aorta  Reduced EF from longstanding hypertension Chronic atrial fibrillation rate controlled Hypertension, well controlled   Plan: Patient is not a candidate for further cardiac surgery.  She has minimal symptoms and an acceptable functional status and medications are working well to control blood pressure, atrial fibrillation, fluid retention.  She will not need further surgical follow-up and she will keep her appointments with her cardiologist, Dr. 95.  Anne Fu, MD [facility] [phone]

## 2020-07-05 DIAGNOSIS — R52 Pain, unspecified: Secondary | ICD-10-CM | POA: Diagnosis not present

## 2020-07-05 DIAGNOSIS — Z20822 Contact with and (suspected) exposure to covid-19: Secondary | ICD-10-CM | POA: Diagnosis not present

## 2020-07-06 DIAGNOSIS — R5383 Other fatigue: Secondary | ICD-10-CM | POA: Diagnosis not present

## 2020-07-07 ENCOUNTER — Other Ambulatory Visit: Payer: Self-pay

## 2020-07-07 ENCOUNTER — Emergency Department (HOSPITAL_COMMUNITY)
Admission: EM | Admit: 2020-07-07 | Discharge: 2020-07-07 | Disposition: A | Discharge status: Home / Self Care | Payer: Medicare HMO | Attending: Emergency Medicine | Admitting: Emergency Medicine

## 2020-07-07 ENCOUNTER — Emergency Department (HOSPITAL_COMMUNITY): Payer: Medicare HMO

## 2020-07-07 ENCOUNTER — Telehealth: Payer: Self-pay | Admitting: Cardiology

## 2020-07-07 DIAGNOSIS — I5043 Acute on chronic combined systolic (congestive) and diastolic (congestive) heart failure: Secondary | ICD-10-CM | POA: Diagnosis not present

## 2020-07-07 DIAGNOSIS — R0602 Shortness of breath: Secondary | ICD-10-CM | POA: Diagnosis present

## 2020-07-07 DIAGNOSIS — Z7982 Long term (current) use of aspirin: Secondary | ICD-10-CM | POA: Diagnosis not present

## 2020-07-07 DIAGNOSIS — Z79899 Other long term (current) drug therapy: Secondary | ICD-10-CM | POA: Diagnosis not present

## 2020-07-07 DIAGNOSIS — I110 Hypertensive heart disease with heart failure: Secondary | ICD-10-CM | POA: Insufficient documentation

## 2020-07-07 DIAGNOSIS — J90 Pleural effusion, not elsewhere classified: Secondary | ICD-10-CM | POA: Diagnosis not present

## 2020-07-07 DIAGNOSIS — J811 Chronic pulmonary edema: Secondary | ICD-10-CM | POA: Diagnosis not present

## 2020-07-07 DIAGNOSIS — R079 Chest pain, unspecified: Secondary | ICD-10-CM | POA: Diagnosis not present

## 2020-07-07 DIAGNOSIS — J9811 Atelectasis: Secondary | ICD-10-CM | POA: Diagnosis not present

## 2020-07-07 LAB — URINALYSIS, ROUTINE W REFLEX MICROSCOPIC
Bilirubin Urine: NEGATIVE
Glucose, UA: NEGATIVE mg/dL
Hgb urine dipstick: NEGATIVE
Ketones, ur: NEGATIVE mg/dL
Leukocytes,Ua: NEGATIVE
Nitrite: POSITIVE — AB
Protein, ur: NEGATIVE mg/dL
Specific Gravity, Urine: 1.008 (ref 1.005–1.030)
pH: 7 (ref 5.0–8.0)

## 2020-07-07 LAB — TROPONIN I (HIGH SENSITIVITY)
Troponin I (High Sensitivity): 29 ng/L — ABNORMAL HIGH (ref <18)
Troponin I (High Sensitivity): 30 ng/L — ABNORMAL HIGH (ref <18)

## 2020-07-07 LAB — BASIC METABOLIC PANEL
Anion gap: 14 (ref 5–15)
BUN: 20 mg/dL (ref 8–23)
CO2: 20 mmol/L — ABNORMAL LOW (ref 22–32)
Calcium: 9.9 mg/dL (ref 8.9–10.3)
Chloride: 98 mmol/L (ref 98–111)
Creatinine, Ser: 1.27 mg/dL — ABNORMAL HIGH (ref 0.44–1.00)
GFR, Estimated: 40 mL/min — ABNORMAL LOW (ref >60)
Glucose, Bld: 151 mg/dL — ABNORMAL HIGH (ref 70–99)
Potassium: 4.4 mmol/L (ref 3.5–5.1)
Sodium: 132 mmol/L — ABNORMAL LOW (ref 135–145)

## 2020-07-07 LAB — CBC
HCT: 41.4 % (ref 36.0–46.0)
Hemoglobin: 13.5 g/dL (ref 12.0–15.0)
MCH: 30.9 pg (ref 26.0–34.0)
MCHC: 32.6 g/dL (ref 30.0–36.0)
MCV: 94.7 fL (ref 80.0–100.0)
Platelets: 242 10*3/uL (ref 150–400)
RBC: 4.37 MIL/uL (ref 3.87–5.11)
RDW: 13.9 % (ref 11.5–15.5)
WBC: 7.7 10*3/uL (ref 4.0–10.5)
nRBC: 0 % (ref 0.0–0.2)

## 2020-07-07 LAB — DIGOXIN LEVEL: Digoxin Level: 1 ng/mL (ref 1.0–2.0)

## 2020-07-07 LAB — BRAIN NATRIURETIC PEPTIDE: B Natriuretic Peptide: 1076.8 pg/mL — ABNORMAL HIGH (ref 0.0–100.0)

## 2020-07-07 MED ORDER — FUROSEMIDE 40 MG PO TABS: 40.0000 mg | ORAL_TABLET | Freq: Two times a day (BID) | ORAL | 0 refills | Status: DC

## 2020-07-07 MED ORDER — FUROSEMIDE 10 MG/ML IJ SOLN
40.0000 mg | INTRAMUSCULAR | Status: AC
  Administered 2020-07-07: 40 mg via INTRAVENOUS
  Filled 2020-07-07: qty 4

## 2020-07-07 MED ORDER — DILTIAZEM HCL 25 MG/5ML IV SOLN
10.0000 mg | Freq: Once | INTRAVENOUS | Status: AC
  Administered 2020-07-07: 10 mg via INTRAVENOUS
  Filled 2020-07-07: qty 5

## 2020-07-07 NOTE — ED Triage Notes (Signed)
Daughter at bedside stated patient started with difficulty sleeping approx 2 nights ago, c/o generalized aches and pains and had a negative covid test. Endorsed recent changes to furosemide as well amd concern for fluid retention. Daughter endorsed significant cardiac hx.

## 2020-07-07 NOTE — Telephone Encounter (Signed)
[name] is calling stating for the last two days [name] has been having a panic attack every time she closes her eyes to the point she cannot sleep. [name] states [name] reports body tingles all over her body, but cannot specify a specific area it is coming from. [name] took her to her PCP yesterday and her Covid test came back negative. They also took some blood work and gave her a Benadryl to help her sleep, stating it seems to be panic attacks. [name] is very concerned due to her having it occur again last night. She is not sure what she should do, please advise.

## 2020-07-07 NOTE — ED Notes (Signed)
Pt discharged ambulatory. All questions and concerns addressed. No complaints at this time.   

## 2020-07-07 NOTE — Discharge Instructions (Signed)
  I have reviewed all of your blood work, EKG and chest x-ray.  There does appear to be some extra fluid on your body, however the medication that we have given you has started to work.  You will need to make sure that you are taking your medications exactly as prescribed but I would like for you to take your furosemide twice a day instead of once a day for the next 2 weeks.  You will need to have a close follow-up with your cardiologist, please call them in the morning to make a follow-up for the next available appointment.  That being said if you should develop increasing or worsening symptoms or have any other concerns please return to the emergency department immediately

## 2020-07-07 NOTE — ED Provider Notes (Signed)
[facility] EMERGENCY DEPARTMENT Provider Note   CSN: [id number] Arrival date & time: 07/07/20  1504     History Chief Complaint  Patient presents with  . Chest Pain    [name] is a 86 y.o. female.  HPI   This patient is a 86 year old female, she has a known history of a type a aortic dissection, this was repaired surgically, she has a known history of chronic atrial fibrillation but has not been on anticoagulation due to the risk for bleeding from her dissection.  She does have a known history of congestive heart failure both systolic and diastolic, her last ejection fraction was estimated to be 25%, this was at a hospital in [place], the medical record has been updated with the scanned document from 2019.  The patient is followed closely by cardiology and Dr. Anne Fu, she has been on furosemide ever since being diagnosed with heart failure around 2014, about 1 month ago she went down to 1 dose of Lasix a day instead of 2 doses because of some evening frequent urination.  Over the last month she has noted some dysuria, she was treated with a urinary tract infection antibiotic, was resistant to that antibiotic, given a different antibiotic, that was in early September.  She improved.  She was in her usual state of health except that over the last month she has had some progressive swelling of her legs and over the last 2 nights has had some shortness of breath feeling like when she lays down at night she closes her eyes and then feels like her insides are on fire and like she cannot sleep and that she is a little bit short of breath.  She does not feel this at this time.  Her symptoms are intermittent, all of this information was obtained through a translator, the primary language is [place].  The patient is not anticoagulated, she takes both metoprolol and diltiazem as well as digoxin.  Past Medical History:  Diagnosis Date  . Aortic atherosclerosis (HCC) 06/03/2013  .  Aortic dissection (HCC)    a. Type A aortic dissection thoracic repair by Dr. Maren Beach with biologic Bentall procedure 2014. b. has a persistent patent false lumen distally in the proximal descending thoracic aorta being managed medically as she is not a candidate for redo surgery.  . Aortic root aneurysm (HCC)   . Chronic atrial fibrillation (HCC)    a. Not on anticoag due to risk of further bleeding from [place] type B dissection.  . Chronic atrial flutter (HCC)   . Chronic combined systolic and diastolic CHF (congestive heart failure) (HCC)    a. echo 04/2013: EF 40-45% with diffuse HK, grade 2 DD, mild AI, mild MR, mod LAE, mildly reduced RV function, mild pulm HTN.  . Essential hypertension   . Hypokalemia 08/06/2013    Patient Active Problem List   Diagnosis Date Noted  . 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 Aortic dissection (HCC) 07/06/2015  . Aortic dissection (HCC) 07/06/2015  . Chronic atrial fibrillation (HCC)   . Chronic atrial flutter (HCC)   . Chronic combined systolic and diastolic CHF (congestive heart failure) (HCC)   . Essential hypertension   . Hypoxia 08/09/2013  . Hypokalemia 08/06/2013  . Aortic atherosclerosis (HCC) 06/03/2013  . Renal insufficiency 03/04/2013  . Intramural aortic hematoma (HCC) 11/07/2012  . Aortic root aneurysm [facility])     Past Surgical History:  Procedure Laterality Date  . ASCENDING AORTIC ANEURYSM REPAIR  10/2012  . BENTALL PROCEDURE N/A 11/11/2012   Procedure: BENTALL PROCEDURE;  Surgeon: Kerin Perna, MD;  Location: [facility] OR;  Service: Open Heart Surgery;  Laterality: N/A;  *CIRC ARREST*   . INTRAOPERATIVE TRANSESOPHAGEAL ECHOCARDIOGRAM N/A 11/11/2012   Procedure: INTRAOPERATIVE TRANSESOPHAGEAL ECHOCARDIOGRAM;  Surgeon: Kerin Perna, MD;  Location: [facility] OR;  Service: Open Heart Surgery;  Laterality: N/A;     OB History   No obstetric history on file.     Family History  Problem Relation Age of Onset  . Heart attack  Neg Hx   . Heart disease Neg Hx   . Heart failure Neg Hx   . Hypertension Neg Hx     Social History   Tobacco Use  . Smoking status: Never Smoker  . Smokeless tobacco: Never Used  Substance Use Topics  . Alcohol use: No  . Drug use: No    Home Medications Prior to Admission medications   Medication Sig Start Date End Date Taking? Authorizing Provider  aspirin 81 MG chewable tablet Chew 81 mg by mouth daily.   Yes [provider]  digoxin (LANOXIN) 0.125 MG tablet TAKE 1 TABLET BY MOUTH DAILY Patient taking differently: Take 0.125 mg by mouth daily.  05/18/20  Yes Jake Bathe, MD  diltiazem (CARDIZEM SR) 120 MG 12 hr capsule TAKE 1 CAPSULE BY MOUTH EVERY DAY Patient taking differently: Take 120 mg by mouth daily.  08/18/19  Yes Jake Bathe, MD  ibuprofen (ADVIL) 200 MG tablet Take 200 mg by mouth every 6 (six) hours as needed for headache.   Yes [provider]  irbesartan (AVAPRO) 75 MG tablet TAKE 1/2 TABLET ONCE DAILY Patient taking differently: Take 37.5 mg by mouth daily.  08/18/19  Yes Jake Bathe, MD  metoprolol succinate (TOPROL-XL) 25 MG 24 hr tablet Take 0.5 tablets (12.5 mg total) by mouth daily. Take with or immediately following a meal. Patient taking differently: Take 12.5 mg by mouth daily.  05/05/20  Yes Georgie Chard D, NP  furosemide (LASIX) 40 MG tablet Take 1 tablet (40 mg total) by mouth 2 (two) times daily for 14 days. 07/07/20 07/21/20  Eber Hong, MD    Allergies    Patient has no known allergies.  Review of Systems   Review of Systems  All other systems reviewed and are negative.   Physical Exam Updated Vital Signs BP (!) 116/100   Pulse 80   Temp 97.6 F (36.4 C) (Oral)   Resp (!) 34   SpO2 97%   Physical Exam Vitals and nursing note reviewed.  Constitutional:      General: She is not in acute distress.    Appearance: She is well-developed.  HENT:     Head: Normocephalic and atraumatic.     Mouth/Throat:      Pharynx: No oropharyngeal exudate.  Eyes:     General: No scleral icterus.       Right eye: No discharge.        Left eye: No discharge.     Conjunctiva/sclera: Conjunctivae normal.     Pupils: Pupils are equal, round, and reactive to light.  Neck:     Thyroid: No thyromegaly.     Vascular: No JVD.  Cardiovascular:     Rate and Rhythm: Rhythm irregular.     Heart sounds: Normal heart sounds. No murmur heard.  No friction rub. No gallop.      Comments: Heart rate is between 90 and 130, appears irregular,  strong pulses at the radial arteries, no JVD Pulmonary:     Effort: Pulmonary effort is normal. No respiratory distress.     Breath sounds: Rales present. No wheezing.     Comments: There is no increased work of breathing, no tachypnea, no hypoxia however she does have some subtle rales at the bases with deep breathing Abdominal:     General: Bowel sounds are normal. There is no distension.     Palpations: Abdomen is soft. There is no mass.     Tenderness: There is no abdominal tenderness.  Musculoskeletal:        General: No tenderness. Normal range of motion.     Cervical back: Normal range of motion and neck supple.     Right lower leg: Edema present.     Left lower leg: Edema present.     Comments: She has nontender legs without rash, she has bilateral symmetrical pitting edema in the lower anterior tibial area through the ankles  Lymphadenopathy:     Cervical: No cervical adenopathy.  Skin:    General: Skin is warm and dry.     Findings: No erythema or rash.  Neurological:     Mental Status: She is alert.     Coordination: Coordination normal.     Comments: The patient has no facial droop, she follows commands and has normal strength.  Psychiatric:        Behavior: Behavior normal.     ED Results / Procedures / Treatments   Labs (all labs ordered are listed, but only abnormal results are displayed) Labs Reviewed  BASIC METABOLIC PANEL - Abnormal; Notable for the  following components:      Result Value   Sodium 132 (*)    CO2 20 (*)    Glucose, Bld 151 (*)    Creatinine, Ser 1.27 (*)    GFR, Estimated 40 (*)    All other components within normal limits  BRAIN NATRIURETIC PEPTIDE - Abnormal; Notable for the following components:   B Natriuretic Peptide 1,076.8 (*)    All other components within normal limits  URINALYSIS, ROUTINE W REFLEX MICROSCOPIC - Abnormal; Notable for the following components:   APPearance HAZY (*)    Nitrite POSITIVE (*)    Bacteria, UA RARE (*)    All other components within normal limits  TROPONIN I (HIGH SENSITIVITY) - Abnormal; Notable for the following components:   Troponin I (High Sensitivity) 29 (*)    All other components within normal limits  TROPONIN I (HIGH SENSITIVITY) - Abnormal; Notable for the following components:   Troponin I (High Sensitivity) 30 (*)    All other components within normal limits  URINE CULTURE  CBC  DIGOXIN LEVEL    EKG EKG Interpretation  Date/Time:  Wednesday July 07 2020 15:26:21 EST Ventricular Rate:  125 PR Interval:    QRS Duration: 102 QT Interval:  340 QTC Calculation: 490 R Axis:   -82 Text Interpretation: Atrial fibrillation with rapid ventricular response with premature ventricular or aberrantly conducted complexes Left axis deviation Inferior infarct , age undetermined Anteroseptal infarct , age undetermined Abnormal ECG Confirmed by Eber Hong ([id number]) on 07/07/2020 4:09:49 PM   Radiology DG Chest 2 View  Result Date: 07/07/2020 CLINICAL DATA:  Chest pain in a 86 year old female with known aortic aneurysm. EXAM: CHEST - 2 VIEW COMPARISON:  06/09/2020, CT from 2014 and chest x-ray from January 30, 2018 FINDINGS: Tracheal air column mildly displaced to the RIGHT secondary to large aortic aneurysm.  Cardiomediastinal contours with marked aortic dilation with similar appearance to previous imaging. Post median sternotomy. No lobar level consolidative changes.  Basilar atelectasis and possible trace LEFT pleural effusion. Mild increased interstitial markings. Osteopenia.  On limited assessment no acute skeletal process. IMPRESSION: 1. Mediastinal contours with marked aneurysmal dilation of the thoracic aorta, similar to previous imaging. 2. Basilar atelectasis with trace LEFT pleural effusion. 3. Mild increased interstitial markings, may be due to mild pulmonary edema. Electronically Signed   By: Donzetta Kohut M.D.   On: 07/07/2020 16:07    Procedures Procedures (including critical care time)  Medications Ordered in ED Medications  diltiazem (CARDIZEM) injection 10 mg (10 mg Intravenous Given 07/07/20 1636)  furosemide (LASIX) injection 40 mg (40 mg Intravenous Given 07/07/20 1641)    ED Course  I have reviewed the triage vital signs and the nursing notes.  Pertinent labs & imaging results that were available during my care of the patient were reviewed by me and considered in my medical decision making (see chart for details).  Clinical Course as of Jul 07 1928  Wed Jul 07, 2020  9417 The patient has diuresed and is feeling better, her heart rate is around 100, still in mild A. fib.  Second troponin pending as the first 1 was elevated at 27, not surprising since she does have congestive heart failure.    [BM]    Clinical Course User Index [BM] Eber Hong, MD   MDM Rules/Calculators/A&P                          The patient's EKG is abnormal and that it shows atrial fibrillation with a rapid response however she has a history of chronic A. fib and is currently on diltiazem, metoprolol and digoxin for a combination of heart failure and chronic atrial fibrillation.  Her chest x-ray does show a trace left pleural effusion, there is possibly some interstitial edema, this is all nonsurprising since she has recently cut back on her Lasix.  Obtaining labs at this time including digoxin level, troponin, urinalysis with a culture as she has recently  developed some dysuria, she otherwise appears well and in no distress.  She is afebrile normotensive and has tested negative for Covid.  The patient has been informed of all of her results, she appears well, she has diuresed, she and the family member both understand the need for increasing diuresis over the next couple of weeks.  She has been encouraged to take her medicine twice a day.  Her troponin has remained flat, this is not an ischemic event but rather related to some decompensated congestive heart failure.  She is not tachypneic on my exam, her respiratory rate is between 16 and 20 breaths/min and she speaks in full sentences very comfortably.  She is not tachycardic at this time, she appears stable for discharge and understands indications for return.  Final Clinical Impression(s) / ED Diagnoses Final diagnoses:  Acute on chronic combined systolic and diastolic congestive heart failure (HCC)    Rx / DC Orders ED Discharge Orders         Ordered    furosemide (LASIX) 40 MG tablet  2 times daily       Note to Pharmacy: DOSE ADJUSTMENT   07/07/20 1926           Eber Hong, MD 07/07/20 1929

## 2020-07-07 NOTE — Telephone Encounter (Signed)
I[place], Daughter of the patient decided to take the patient to the Hospital.

## 2020-07-10 LAB — URINE CULTURE: Culture: >=100000 — AB

## 2020-07-11 ENCOUNTER — Telehealth (HOSPITAL_BASED_OUTPATIENT_CLINIC_OR_DEPARTMENT_OTHER): Payer: Self-pay | Admitting: Emergency Medicine

## 2020-07-11 NOTE — Telephone Encounter (Signed)
Post ED Visit - Positive Culture Follow-up  Culture report reviewed by antimicrobial stewardship pharmacist: [facility] []  , Pharm.D. []  Enzo Bi, Pharm.D., BCPS AQ-ID []  , Pharm.D., BCPS []  Celedonio Miyamoto, .D., BCPS []  [place], .D., BCPS, AAHIVP []  Georgina Pillion, Pharm.D., BCPS, AAHIVP [x]  [address], PharmD, BCPS []  , PharmD, BCPS []  [place], PharmD, BCPS []  [address], PharmD []  , PharmD, BCPS []  Estella Husk, PharmD  Pharmacy Team []  Lysle Pearl, PharmD []  , PharmD []  Phillips Climes, PharmD []  , Rph []  Agapito Games) , PharmD []  Verlan Friends, PharmD []  , PharmD []  Mervyn Gay, PharmD []  , PharmD []  Vinnie Level, PharmD []  [facility], PharmD []  , PharmD []  Len Childs, PharmD   Positive urine culture Likely contaminant, no further patient follow-up is required at this time.  Cacie Gaskins 07/11/2020, 12:06 PM

## 2020-07-12 ENCOUNTER — Telehealth: Payer: Self-pay | Admitting: Cardiology

## 2020-07-12 NOTE — Telephone Encounter (Signed)
Spoke with daughter who basically wanted to inform Dr Anne Fu that pt was recently seen in the ED for CHF and At Fib.  Daughter reports pt is feeling some better at this time but wants to make sure she is receiving the correct follow up and testing.  At the time of thecall the pt was scheduled for f/u but not until 12/14.  Advised daughter it is important to keep a closer check on her esp since BNP was 1076 in ED.  She was given IV Furosemide and her daily oral dose was doubled but her creatinine was 1.27/BUN 20.  This also needs to be followed closely.  Re-scheduled pt's appt to 07/15/2020 at 1:40 pm.  Daughter is aware and grateful for the earlier f/u.  She will call back before her appt if any changes in her s/s.  Will forward this information to Dr Anne Fu for his knowledge and any further instructions necessary.

## 2020-07-12 NOTE — Telephone Encounter (Signed)
New message:     Patient daughter asking for a order for her mother before her apt.

## 2020-07-13 ENCOUNTER — Telehealth: Payer: Self-pay | Admitting: Cardiology

## 2020-07-13 NOTE — Telephone Encounter (Signed)
[name] is calling stating she has an important meeting on 07/15/20 right around the time of [name]'s scheduled appointment. She is wanting to know if the appointment could be made a little later so they could still come in Thursday. There is a 3:20 Pm DOD slot available that day, if it would be okay to make the switch. Please advise.

## 2020-07-13 NOTE — Telephone Encounter (Signed)
Scheduled appt for daughter to Thursday 12/2 at 10:40 am. Daughter aware.

## 2020-07-15 ENCOUNTER — Ambulatory Visit: Payer: Medicare HMO | Admitting: Cardiology

## 2020-07-15 ENCOUNTER — Other Ambulatory Visit: Payer: Self-pay

## 2020-07-15 ENCOUNTER — Encounter: Payer: Self-pay | Admitting: Cardiology

## 2020-07-15 VITALS — BP 112/70 | HR 80 | Ht 62.0 in | Wt 108.0 lb

## 2020-07-15 DIAGNOSIS — I719 Aortic aneurysm of unspecified site, without rupture: Secondary | ICD-10-CM

## 2020-07-15 DIAGNOSIS — Z79899 Other long term (current) drug therapy: Secondary | ICD-10-CM

## 2020-07-15 DIAGNOSIS — I5042 Chronic combined systolic (congestive) and diastolic (congestive) heart failure: Secondary | ICD-10-CM

## 2020-07-15 DIAGNOSIS — I7121 Aneurysm of the ascending aorta, without rupture: Secondary | ICD-10-CM

## 2020-07-15 LAB — COMPREHENSIVE METABOLIC PANEL
ALT: 18 IU/L (ref 0–32)
AST: 17 IU/L (ref 0–40)
Albumin/Globulin Ratio: 1.3 (ref 1.2–2.2)
Albumin: 4.2 g/dL (ref 3.5–4.6)
Alkaline Phosphatase: 85 IU/L (ref 44–121)
BUN/Creatinine Ratio: 16 (ref 12–28)
BUN: 21 mg/dL (ref 10–36)
Bilirubin Total: 1.5 mg/dL — ABNORMAL HIGH (ref 0.0–1.2)
CO2: 25 mmol/L (ref 20–29)
Calcium: 9.8 mg/dL (ref 8.7–10.3)
Chloride: 99 mmol/L (ref 96–106)
Creatinine, Ser: 1.31 mg/dL — ABNORMAL HIGH (ref 0.57–1.00)
GFR calc Af Amer: 41 mL/min/{1.73_m2} — ABNORMAL LOW (ref >59)
GFR calc non Af Amer: 36 mL/min/{1.73_m2} — ABNORMAL LOW (ref >59)
Globulin, Total: 3.3 g/dL (ref 1.5–4.5)
Glucose: 102 mg/dL — ABNORMAL HIGH (ref 65–99)
Potassium: 4.2 mmol/L (ref 3.5–5.2)
Sodium: 139 mmol/L (ref 134–144)
Total Protein: 7.5 g/dL (ref 6.0–8.5)

## 2020-07-15 NOTE — Progress Notes (Signed)
Cardiology Office Note:    Date:  07/15/2020   ID:  [name], DOB 08-06-1928, MRN [id number]  PCP:  Mila Palmer, MD  [facility] Cardiologist:  Donato Schultz, MD  [facility] Electrophysiologist:  None   Referring MD: Farris Has, MD     History of Present Illness:    [name] is a 86 y.o. female here for the follow-up of repair of type a ascending aortic dissection approximately 8 years ago with a 30 mm graft and resuspension of the aortic valve, Dr. Maren Beach.  Had an EF of 25% nonischemic by echo in 2019.  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  She went to the emergency room on 07/07/2020 after feeling worse, more insomnia, slightly more short of breath.  Tried melatonin.  Ended up calling EMS.  They checked her blood pressure and thought that she was okay.  Her daughter insisted that she go forward with the ER visit.  Chest x-ray did show some minimal edema, small pleural effusion.  Aortic aneurysm noted.  Stable.  Not a candidate for surgery.  Her Lasix was once again increased back to 40 mg twice a day.  This was previously decreased due to her insomnia hopefully help her.  She was also told from her [facility] physician that her LFTs were off.  Since she is getting blood work today we will double check.  Now she feels better.  No significant ankle edema.  Her daughter is with her to help with history.   Past Medical History:  Diagnosis Date  . Aortic atherosclerosis (HCC) 06/03/2013  . Aortic dissection (HCC)    a. Type A aortic dissection thoracic repair by Dr. Maren Beach with biologic Bentall procedure 2014. b. has a persistent patent false lumen distally in the proximal descending thoracic aorta being managed medically as she is not a candidate for redo surgery.  . Aortic root aneurysm (HCC)   . Chronic atrial  fibrillation (HCC)    a. Not on anticoag due to risk of further bleeding from Stanford type B dissection.  . Chronic atrial flutter (HCC)   . Chronic combined systolic and diastolic CHF (congestive heart failure) (HCC)    a. echo 04/2013: EF 40-45% with diffuse HK, grade 2 DD, mild AI, mild MR, mod LAE, mildly reduced RV function, mild pulm HTN.  . Essential hypertension   . Hypokalemia 08/06/2013    Past Surgical History:  Procedure Laterality Date  . ASCENDING AORTIC ANEURYSM REPAIR  10/2012  . BENTALL PROCEDURE N/A 11/11/2012   Procedure: BENTALL PROCEDURE;  Surgeon: Kerin Perna, MD;  Location: [facility] OR;  Service: Open Heart Surgery;  Laterality: N/A;  *CIRC ARREST*   . INTRAOPERATIVE TRANSESOPHAGEAL ECHOCARDIOGRAM N/A 11/11/2012   Procedure: INTRAOPERATIVE TRANSESOPHAGEAL ECHOCARDIOGRAM;  Surgeon: Kerin Perna, MD;  Location: [facility] OR;  Service: Open Heart Surgery;  Laterality: N/A;    Current Medications: Current Meds  Medication Sig  . aspirin 81 MG chewable tablet Chew 81 mg by mouth daily.  . digoxin (LANOXIN) 0.125 MG tablet TAKE 1 TABLET BY MOUTH DAILY  . diltiazem (CARDIZEM SR) 120 MG 12 hr capsule TAKE 1 CAPSULE BY MOUTH EVERY DAY  . furosemide (LASIX) 40 MG tablet Take 1 tablet (40 mg total) by mouth 2 (two) times daily for 14 days.  Marland Kitchen ibuprofen (ADVIL) 200 MG tablet Take 200 mg by mouth every 6 (six) hours as  needed for headache.  . irbesartan (AVAPRO) 75 MG tablet TAKE 1/2 TABLET ONCE DAILY  . metoprolol succinate (TOPROL-XL) 25 MG 24 hr tablet Take 0.5 tablets (12.5 mg total) by mouth daily. Take with or immediately following a meal.     Allergies:   Patient has no known allergies.   Social History   Socioeconomic History  . Marital status: Widowed    Spouse name: Not on file  . Number of children: 2  . Years of education: Not on file  . Highest education level: Not on file  Occupational History  . Not on file  Tobacco Use  . Smoking status: Never Smoker  .  Smokeless tobacco: Never Used  Substance and Sexual Activity  . Alcohol use: No  . Drug use: No  . Sexual activity: Never  Other Topics Concern  . Not on file  Social History Narrative   From [place], [place]   Social Determinants of Health   Financial Resource Strain:   . Difficulty of Paying Living Expenses: Not on file  Food Insecurity:   . Worried About Programme researcher, broadcasting/film/video in the Last Year: Not on file  . Ran Out of Food in the Last Year: Not on file  Transportation Needs:   . Lack of Transportation (Medical): Not on file  . Lack of Transportation (Non-Medical): Not on file  Physical Activity:   . Days of Exercise per Week: Not on file  . Minutes of Exercise per Session: Not on file  Stress:   . Feeling of Stress : Not on file  Social Connections:   . Frequency of Communication with Friends and Family: Not on file  . Frequency of Social Gatherings with Friends and Family: Not on file  . Attends Religious Services: Not on file  . Active Member of Clubs or Organizations: Not on file  . Attends Banker Meetings: Not on file  . Marital Status: Not on file     Family History: The patient's family history is negative for Heart attack, Heart disease, Heart failure, and Hypertension.  ROS:   Please see the history of present illness.     All other systems reviewed and are negative.  EKGs/Labs/Other Studies Reviewed:    The following studies were reviewed today: Chest x-ray personally reviewed shows mild interstitial edema, small pleural effusion, ascending aortic aneurysm  EKG: A. fib 125 07/07/2020 in ER.  Recent Labs: 07/07/2020: B Natriuretic Peptide 1,076.8; BUN 20; Creatinine, Ser 1.27; Hemoglobin 13.5; Platelets 242; Potassium 4.4; Sodium 132  Recent Lipid Panel No results found for: CHOL, TRIG, HDL, CHOLHDL, VLDL, LDLCALC, LDLDIRECT   Risk Assessment/Calculations:     Physical Exam:    VS:  BP 112/70   Pulse 80   Ht 5\' 2"  (1.575 m)   Wt  108 lb (49 kg)   SpO2 97%   BMI 19.75 kg/m     Wt Readings from Last 3 Encounters:  07/15/20 108 lb (49 kg)  06/09/20 110 lb (49.9 kg)  06/03/20 108 lb (49 kg)    GEN:  Thin, well developed in no acute distress HEENT: Normal NECK: No JVD; No carotid bruits LYMPHATICS: No lymphadenopathy CARDIAC: irreg no murmurs, rubs, gallops RESPIRATORY:  Clear to auscultation without rales, wheezing or rhonchi  ABDOMEN: Soft, non-tender, non-distended MUSCULOSKELETAL:  No edema; No deformity  SKIN: Warm and dry NEUROLOGIC:  Alert and oriented x 3 PSYCHIATRIC:  Normal affect   ASSESSMENT:    1. Chronic combined systolic and diastolic  CHF (congestive heart failure) (HCC)   2. Medication management    PLAN:    In order of problems listed above:  Mild episode of diastolic heart failure -Agree with Lasix increase 40 twice daily.  We will check complete metabolic profile today to also look at her elevated liver enzymes.  Prior creatinine 1.27.  Continue.  BNP elevated in ER  Type a aortic dissection status post repair -Not a future surgical candidate.  Appreciate Dr. Lorrin Mais assistance.  Permanent atrial fibrillation -Digoxin metoprolol.  Continue.  Dig level was normal in the emergency room.  We will see her back in 3 months.  Shared Decision Making/Informed Consent        Medication Adjustments/Labs and Tests Ordered: Current medicines are reviewed at length with the patient today.  Concerns regarding medicines are outlined above.  Orders Placed This Encounter  Procedures  . Comprehensive metabolic panel   No orders of the defined types were placed in this encounter.   Patient Instructions  Medication Instructions:  The current medical regimen is effective;  continue present plan and medications.  *If you need a refill on your cardiac medications before your next appointment, please call your pharmacy*  Lab Work: Please have blood work today (CMP)  If you have labs  (blood work) drawn today and your tests are completely normal, you will receive your results only by: Marland Kitchen MyChart Message (if you have MyChart) OR . A paper copy in the mail If you have any lab test that is abnormal or we need to change your treatment, we will call you to review the results.  Follow-Up: At [facility], you and your health needs are our priority.  As part of our continuing mission to provide you with exceptional heart care, we have created designated Provider Care Teams.  These Care Teams include your primary Cardiologist (physician) and Advanced Practice Providers (APPs -  Physician Assistants and Nurse Practitioners) who all work together to provide you with the care you need, when you need it.  We recommend signing up for the patient portal called "MyChart".  Sign up information is provided on this After Visit Summary.  MyChart is used to connect with patients for Virtual Visits (Telemedicine).  Patients are able to view lab/test results, encounter notes, upcoming appointments, etc.  Non-urgent messages can be sent to your provider as well.   To learn more about what you can do with MyChart, go to [email or web address].    Your next appointment:   3 month(s)  The format for your next appointment:   In Person  Provider:   Donato Schultz, MD   Thank you for choosing [facility]!!        Signed, Donato Schultz, MD  07/15/2020 12:39 PM    [facility]

## 2020-07-15 NOTE — Patient Instructions (Signed)
Medication Instructions:  The current medical regimen is effective;  continue present plan and medications.  *If you need a refill on your cardiac medications before your next appointment, please call your pharmacy*  Lab Work: Please have blood work today (CMP)  If you have labs (blood work) drawn today and your tests are completely normal, you will receive your results only by: Marland Kitchen MyChart Message (if you have MyChart) OR . A paper copy in the mail If you have any lab test that is abnormal or we need to change your treatment, we will call you to review the results.  Follow-Up: At [facility], you and your health needs are our priority.  As part of our continuing mission to provide you with exceptional heart care, we have created designated Provider Care Teams.  These Care Teams include your primary Cardiologist (physician) and Advanced Practice Providers (APPs -  Physician Assistants and Nurse Practitioners) who all work together to provide you with the care you need, when you need it.  We recommend signing up for the patient portal called "MyChart".  Sign up information is provided on this After Visit Summary.  MyChart is used to connect with patients for Virtual Visits (Telemedicine).  Patients are able to view lab/test results, encounter notes, upcoming appointments, etc.  Non-urgent messages can be sent to your provider as well.   To learn more about what you can do with MyChart, go to [email or web address].    Your next appointment:   3 month(s)  The format for your next appointment:   In Person  Provider:   Donato Schultz, MD   Thank you for choosing [facility]!!

## 2020-07-16 ENCOUNTER — Encounter: Payer: Self-pay | Admitting: *Deleted

## 2020-07-27 ENCOUNTER — Ambulatory Visit: Payer: Medicare HMO | Admitting: Cardiology

## 2020-08-25 ENCOUNTER — Other Ambulatory Visit: Payer: Self-pay | Admitting: Cardiology

## 2020-09-16 ENCOUNTER — Other Ambulatory Visit: Payer: Self-pay | Admitting: Cardiology

## 2020-09-16 NOTE — Telephone Encounter (Signed)
*  STAT* If patient is at the pharmacy, call can be transferred to refill team.   1. Which medications need to be refilled? (please list name of each medication and dose if known) furosemide (LASIX) 40 MG tablet   2. Which pharmacy/location (including street and city if local pharmacy) is medication to be sent to? [id number] - [place], [place] - [address] AT [address] OF [address]  3. Do they need a 30 day or 90 day supply? 30 day

## 2020-09-16 NOTE — Telephone Encounter (Signed)
Pt is requesting a refill on furosemide. Pt was prescribed this medication in November of 2021, pt was supposed to take medication for 14 days. Does pt still supposed to be taking this medication? Please address

## 2020-09-20 MED ORDER — FUROSEMIDE 40 MG PO TABS
40.0000 mg | ORAL_TABLET | Freq: Two times a day (BID) | ORAL | 3 refills | Status: DC
Start: 2020-09-20 — End: 2020-12-22

## 2020-10-03 ENCOUNTER — Other Ambulatory Visit: Payer: Self-pay | Admitting: Cardiology

## 2020-10-15 ENCOUNTER — Encounter: Payer: Self-pay | Admitting: Cardiology

## 2020-10-15 ENCOUNTER — Other Ambulatory Visit: Payer: Self-pay

## 2020-10-15 ENCOUNTER — Ambulatory Visit (INDEPENDENT_AMBULATORY_CARE_PROVIDER_SITE_OTHER): Payer: Medicare HMO | Admitting: Cardiology

## 2020-10-15 VITALS — BP 100/80 | HR 59 | Ht 62.0 in | Wt 110.0 lb

## 2020-10-15 DIAGNOSIS — I5032 Chronic diastolic (congestive) heart failure: Secondary | ICD-10-CM | POA: Diagnosis not present

## 2020-10-15 DIAGNOSIS — I4821 Permanent atrial fibrillation: Secondary | ICD-10-CM

## 2020-10-15 DIAGNOSIS — I7101 Dissection of thoracic aorta: Secondary | ICD-10-CM | POA: Diagnosis not present

## 2020-10-15 DIAGNOSIS — I71019 Dissection of thoracic aorta, unspecified: Secondary | ICD-10-CM

## 2020-10-15 NOTE — Progress Notes (Signed)
Cardiology Office Note:    Date:  10/15/2020   ID:  [name], DOB [birth year], MRN [id number]  PCP:  Mila Palmer, MD   [facility]  Cardiologist:  Donato Schultz, MD  Advanced Practice Provider:  No care team member to display Electrophysiologist:  None       Referring MD: Mila Palmer, MD     History of Present Illness:    [name] is a 87 y.o. female here for follow-up ascending aortic dissection.  At 30 mm graft and resuspension of aortic valve several years ago.  Dr. Maren Beach.  EF of 25% nonischemic in 2019.  Aortic aneurysm is stable.  Not a candidate for surgery.  She had some mild shortness of breath.  We started her on Lasix 40 twice a day.  Prior creatinine 1.27.  Most recently 1.31.  Inside feels buring and shaking   Past Medical History:  Diagnosis Date  . Aortic atherosclerosis (HCC) 06/03/2013  . Aortic dissection (HCC)    a. Type A aortic dissection thoracic repair by Dr. Maren Beach with biologic Bentall procedure 2014. b. has a persistent patent false lumen distally in the proximal descending thoracic aorta being managed medically as she is not a candidate for redo surgery.  . Aortic root aneurysm (HCC)   . Chronic atrial fibrillation (HCC)    a. Not on anticoag due to risk of further bleeding from Stanford type B dissection.  . Chronic atrial flutter (HCC)   . Chronic combined systolic and diastolic CHF (congestive heart failure) (HCC)    a. echo 04/2013: EF 40-45% with diffuse HK, grade 2 DD, mild AI, mild MR, mod LAE, mildly reduced RV function, mild pulm HTN.  . Essential hypertension   . Hypokalemia 08/06/2013    Past Surgical History:  Procedure Laterality Date  . ASCENDING AORTIC ANEURYSM REPAIR  10/2012  . BENTALL PROCEDURE N/A 11/11/2012   Procedure: BENTALL PROCEDURE;  Surgeon: Kerin Perna, MD;  Location: [facility] OR;  Service: Open Heart Surgery;  Laterality: N/A;  *CIRC ARREST*   . INTRAOPERATIVE TRANSESOPHAGEAL  ECHOCARDIOGRAM N/A 11/11/2012   Procedure: INTRAOPERATIVE TRANSESOPHAGEAL ECHOCARDIOGRAM;  Surgeon: Kerin Perna, MD;  Location: [facility] OR;  Service: Open Heart Surgery;  Laterality: N/A;    Current Medications: Current Meds  Medication Sig  . aspirin 81 MG chewable tablet Chew 81 mg by mouth daily.  . digoxin (LANOXIN) 0.125 MG tablet TAKE 1 TABLET BY MOUTH DAILY  . diltiazem (CARDIZEM SR) 120 MG 12 hr capsule TAKE 1 CAPSULE BY MOUTH EVERY DAY  . furosemide (LASIX) 40 MG tablet Take 1 tablet (40 mg total) by mouth 2 (two) times daily.  Marland Kitchen ibuprofen (ADVIL) 200 MG tablet Take 200 mg by mouth every 6 (six) hours as needed for headache.  . irbesartan (AVAPRO) 75 MG tablet Take 0.5 tablets (37.5 mg total) by mouth daily. Pt needs to call and make appt with provider for further refills - 1st attempt  . metoprolol succinate (TOPROL-XL) 25 MG 24 hr tablet Take 0.5 tablets (12.5 mg total) by mouth daily. Take with or immediately following a meal.     Allergies:   Patient has no known allergies.   Social History   Socioeconomic History  . Marital status: Widowed    Spouse name: Not on file  . Number of children: 2  . Years of education: Not on file  . Highest education level: Not on file  Occupational History  . Not on file  Tobacco Use  .  Smoking status: Never Smoker  . Smokeless tobacco: Never Used  Substance and Sexual Activity  . Alcohol use: No  . Drug use: No  . Sexual activity: Never  Other Topics Concern  . Not on file  Social History Narrative   From [place], [place]   Social Determinants of Health   Financial Resource Strain: Not on [employer] Insecurity: Not on file  Transportation Needs: Not on file  Physical Activity: Not on file  Stress: Not on file  Social Connections: Not on file     Family History: The patient's family history is negative for Heart attack, Heart disease, Heart failure, and Hypertension.  ROS:   Please see the history of present illness.      All other systems reviewed and are negative.  EKGs/Labs/Other Studies Reviewed:    Recent Labs: 07/07/2020: B Natriuretic Peptide 1,076.8; Hemoglobin 13.5; Platelets 242 07/15/2020: ALT 18; BUN 21; Creatinine, Ser 1.31; Potassium 4.2; Sodium 139  Recent Lipid Panel No results found for: CHOL, TRIG, HDL, CHOLHDL, VLDL, LDLCALC, LDLDIRECT   Risk Assessment/Calculations:      Physical Exam:    VS:  BP 100/80 (BP Location: Left Arm, Patient Position: Sitting, Cuff Size: Normal)   Pulse (!) 59   Ht 5\' 2"  (1.575 m)   Wt 110 lb (49.9 kg)   SpO2 94%   BMI 20.12 kg/m     Wt Readings from Last 3 Encounters:  10/15/20 110 lb (49.9 kg)  07/15/20 108 lb (49 kg)  06/09/20 110 lb (49.9 kg)     GEN:  Well nourished, well developed in no acute distress HEENT: Normal NECK: No JVD; No carotid bruits LYMPHATICS: No lymphadenopathy CARDIAC: RRR, no murmurs, rubs, gallops RESPIRATORY:  Clear to auscultation without rales, wheezing or rhonchi  ABDOMEN: Soft, non-tender, non-distended MUSCULOSKELETAL:  No edema; No deformity  SKIN: Warm and dry NEUROLOGIC:  Alert and oriented x 3 PSYCHIATRIC:  Normal affect   ASSESSMENT:    1. Chronic diastolic heart failure (HCC)   2. Dissection of thoracic aorta (HCC)   3. Permanent atrial fibrillation (HCC)    PLAN:    In order of problems listed above:  Chronic diastolic heart failure -Lasix currently 40 mg a day, continue with current management. -Overall doing quite well.  Creatinine has been stable.  1.3.  Permanent atrial fibrillation -On digoxin metoprolol.  Prior dig level was normal.  Type a aortic dissection status post repair -Dr. 06/11/20.  Not a future surgical candidate.  Continue to monitor clinically.    Medication Adjustments/Labs and Tests Ordered: Current medicines are reviewed at length with the patient today.  Concerns regarding medicines are outlined above.  No orders of the defined types were placed in this  encounter.  No orders of the defined types were placed in this encounter.   There are no Patient Instructions on file for this visit.   Signed, Maren Beach, MD  10/15/2020 12:03 PM    [facility]

## 2020-10-15 NOTE — Patient Instructions (Signed)
Medication Instructions:  Your physician recommends that you continue on your current medications as directed. Please refer to the Current Medication list given to you today. *If you need a refill on your cardiac medications before your next appointment, please call your pharmacy*    Follow-Up: At [facility], you and your health needs are our priority.  As part of our continuing mission to provide you with exceptional heart care, we have created designated Provider Care Teams.  These Care Teams include your primary Cardiologist (physician) and Advanced Practice Providers (APPs -  Physician Assistants and Nurse Practitioners) who all work together to provide you with the care you need, when you need it.  We recommend signing up for the patient portal called "MyChart".  Sign up information is provided on this After Visit Summary.  MyChart is used to connect with patients for Virtual Visits (Telemedicine).  Patients are able to view lab/test results, encounter notes, upcoming appointments, etc.  Non-urgent messages can be sent to your provider as well.   To learn more about what you can do with MyChart, go to [email or web address].    Your next appointment:   4 month(s)  The format for your next appointment:   In Person  Provider:   You may see Donato Schultz, MD or one of the following Advanced Practice Providers on your designated Care Team:    Georgie Chard, NP

## 2020-10-16 ENCOUNTER — Encounter (HOSPITAL_COMMUNITY): Payer: Self-pay | Admitting: Emergency Medicine

## 2020-10-16 ENCOUNTER — Emergency Department (HOSPITAL_COMMUNITY)
Admission: EM | Admit: 2020-10-16 | Discharge: 2020-10-16 | Disposition: A | Discharge status: Home / Self Care | Payer: Medicare HMO | Attending: Emergency Medicine | Admitting: Emergency Medicine

## 2020-10-16 ENCOUNTER — Other Ambulatory Visit: Payer: Self-pay

## 2020-10-16 ENCOUNTER — Emergency Department (HOSPITAL_COMMUNITY): Payer: Medicare HMO

## 2020-10-16 DIAGNOSIS — I5042 Chronic combined systolic (congestive) and diastolic (congestive) heart failure: Secondary | ICD-10-CM | POA: Insufficient documentation

## 2020-10-16 DIAGNOSIS — I110 Hypertensive heart disease with heart failure: Secondary | ICD-10-CM | POA: Diagnosis not present

## 2020-10-16 DIAGNOSIS — N390 Urinary tract infection, site not specified: Secondary | ICD-10-CM

## 2020-10-16 DIAGNOSIS — I509 Heart failure, unspecified: Secondary | ICD-10-CM | POA: Diagnosis not present

## 2020-10-16 DIAGNOSIS — R002 Palpitations: Secondary | ICD-10-CM | POA: Diagnosis present

## 2020-10-16 DIAGNOSIS — Z7982 Long term (current) use of aspirin: Secondary | ICD-10-CM | POA: Insufficient documentation

## 2020-10-16 DIAGNOSIS — G4700 Insomnia, unspecified: Secondary | ICD-10-CM | POA: Diagnosis not present

## 2020-10-16 DIAGNOSIS — Z79899 Other long term (current) drug therapy: Secondary | ICD-10-CM | POA: Insufficient documentation

## 2020-10-16 DIAGNOSIS — J9811 Atelectasis: Secondary | ICD-10-CM | POA: Diagnosis not present

## 2020-10-16 DIAGNOSIS — R079 Chest pain, unspecified: Secondary | ICD-10-CM

## 2020-10-16 DIAGNOSIS — I517 Cardiomegaly: Secondary | ICD-10-CM | POA: Diagnosis not present

## 2020-10-16 LAB — CBC
HCT: 41.4 % (ref 36.0–46.0)
Hemoglobin: 13.1 g/dL (ref 12.0–15.0)
MCH: 30.1 pg (ref 26.0–34.0)
MCHC: 31.6 g/dL (ref 30.0–36.0)
MCV: 95.2 fL (ref 80.0–100.0)
Platelets: 244 10*3/uL (ref 150–400)
RBC: 4.35 MIL/uL (ref 3.87–5.11)
RDW: 14.3 % (ref 11.5–15.5)
WBC: 6.9 10*3/uL (ref 4.0–10.5)
nRBC: 0 % (ref 0.0–0.2)

## 2020-10-16 LAB — BASIC METABOLIC PANEL
Anion gap: 9 (ref 5–15)
BUN: 23 mg/dL (ref 8–23)
CO2: 26 mmol/L (ref 22–32)
Calcium: 9.7 mg/dL (ref 8.9–10.3)
Chloride: 100 mmol/L (ref 98–111)
Creatinine, Ser: 1.13 mg/dL — ABNORMAL HIGH (ref 0.44–1.00)
GFR, Estimated: 46 mL/min — ABNORMAL LOW (ref >60)
Glucose, Bld: 133 mg/dL — ABNORMAL HIGH (ref 70–99)
Potassium: 4.1 mmol/L (ref 3.5–5.1)
Sodium: 135 mmol/L (ref 135–145)

## 2020-10-16 LAB — URINALYSIS, ROUTINE W REFLEX MICROSCOPIC
Bilirubin Urine: NEGATIVE
Glucose, UA: NEGATIVE mg/dL
Ketones, ur: NEGATIVE mg/dL
Nitrite: POSITIVE — AB
Protein, ur: NEGATIVE mg/dL
Specific Gravity, Urine: 1.004 — ABNORMAL LOW (ref 1.005–1.030)
pH: 7 (ref 5.0–8.0)

## 2020-10-16 LAB — TROPONIN I (HIGH SENSITIVITY)
Troponin I (High Sensitivity): 20 ng/L — ABNORMAL HIGH (ref <18)
Troponin I (High Sensitivity): 18 ng/L — ABNORMAL HIGH (ref <18)

## 2020-10-16 LAB — BRAIN NATRIURETIC PEPTIDE: B Natriuretic Peptide: 1064.6 pg/mL — ABNORMAL HIGH (ref 0.0–100.0)

## 2020-10-16 MED ORDER — DILTIAZEM HCL 25 MG/5ML IV SOLN
10.0000 mg | Freq: Once | INTRAVENOUS | Status: AC
  Administered 2020-10-16: 10 mg via INTRAVENOUS
  Filled 2020-10-16: qty 5

## 2020-10-16 MED ORDER — FUROSEMIDE 10 MG/ML IJ SOLN
40.0000 mg | Freq: Once | INTRAMUSCULAR | Status: AC
  Administered 2020-10-16: 40 mg via INTRAVENOUS
  Filled 2020-10-16: qty 4

## 2020-10-16 MED ORDER — CEPHALEXIN 250 MG PO CAPS
500.0000 mg | ORAL_CAPSULE | Freq: Once | ORAL | Status: AC
  Administered 2020-10-16: 500 mg via ORAL
  Filled 2020-10-16: qty 2

## 2020-10-16 MED ORDER — LORAZEPAM 1 MG PO TABS
0.5000 mg | ORAL_TABLET | Freq: Every day | ORAL | 0 refills | Status: AC
Start: 2020-10-16 — End: 2020-10-30

## 2020-10-16 MED ORDER — CEPHALEXIN 500 MG PO CAPS: 500.0000 mg | ORAL_CAPSULE | Freq: Two times a day (BID) | ORAL | 0 refills | Status: AC

## 2020-10-16 NOTE — ED Provider Notes (Signed)
[facility] EMERGENCY DEPARTMENT Provider Note   CSN: [id number] Arrival date & time: 10/16/20  8366     History Chief Complaint  Patient presents with  . Fatigue  . Chest Pain    [name] is a 87 y.o. 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  Her daughter is present at bedside and helps translate from [place].  The patient lives alone and her daughter lives next door.  The medical complaints are very nonspecific.  The patient reports chronic dyspnea, it may be worse today.  She DENIES chest pain to me.  Her biggest concern is difficulty sleeping.  Last night she states she could not fall asleep, but cannot describe why.  It is a general feeling of anxiety and tenseness in her entire body.  Her daughter reports the patient may be dealing with anxiety and depression, as she lost her husband recently.  They have been trying melatonin 1 mg nightly and are not sure if this is helping.  Her daughter is concerned that this presentation of symptoms is very similar to the patient's Nov 2021 presentation for which she came to the ED, where she received diltiazem for rate control and IV lasix and was discharged home.  She continues on lasix 40 mg BID since that time.  She is compliant with all her medications faithfully. She took her morning medicines today prior to coming to the ED.  Pt on dijoxin 0.125 mg daily, cardizem 120 mg daily, irbesartan 37.5 mg daily, Toprol 12.5 mg daily, aspirin 81 mg daily, lasix 40 mg BID.  EF 25% on last echo in may 2019 with moderate mitral regurg and moderate aortic insufficiency  The patient is not felt to be a candidate for any elective or  emergent surgery of her thoracic aortic disease per outpatient notes reviewed from cardiology 05/15/20 Georgie Chard and 06/09/20 with Dr Donata Clay.  She is not a candidate for A Fib anticoagulation as well with her aortic hematoma noted in the past.  HPI     Past Medical History:  Diagnosis Date  . Aortic atherosclerosis (HCC) 06/03/2013  . Aortic dissection (HCC)    a. Type A aortic dissection thoracic repair by Dr. Maren Beach with biologic Bentall procedure 2014. b. has a persistent patent false lumen distally in the proximal descending thoracic aorta being managed medically as she is not a candidate for redo surgery.  . Aortic root aneurysm (HCC)   . Chronic atrial fibrillation (HCC)    a. Not on anticoag due to risk of further bleeding from [place] type B dissection.  . Chronic atrial flutter (HCC)   . Chronic combined systolic and diastolic CHF (congestive heart failure) (HCC)    a. echo 04/2013: EF 40-45% with diffuse HK, grade 2 DD, mild AI, mild MR, mod LAE, mildly reduced RV function, mild pulm HTN.  . Essential hypertension   . Hypokalemia 08/06/2013    Patient Active Problem List   Diagnosis Date Noted  . 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 Aortic dissection (HCC) 07/06/2015  . Aortic dissection (HCC) 07/06/2015  . Chronic atrial fibrillation (HCC)   . Chronic atrial flutter (HCC)   . Chronic  combined systolic and diastolic CHF (congestive heart failure) (HCC)   . Essential hypertension   . Hypoxia 08/09/2013  . Hypokalemia 08/06/2013  . Aortic atherosclerosis (HCC) 06/03/2013  . Renal insufficiency 03/04/2013  . Intramural aortic hematoma (HCC) 11/07/2012  . Aortic root aneurysm [facility])     Past Surgical History:  Procedure Laterality Date  . ASCENDING AORTIC ANEURYSM REPAIR  10/2012  . BENTALL PROCEDURE N/A 11/11/2012   Procedure: BENTALL PROCEDURE;  Surgeon: Kerin Perna, MD;  Location: [facility] OR;  Service: Open Heart Surgery;  Laterality: N/A;  *CIRC  ARREST*   . INTRAOPERATIVE TRANSESOPHAGEAL ECHOCARDIOGRAM N/A 11/11/2012   Procedure: INTRAOPERATIVE TRANSESOPHAGEAL ECHOCARDIOGRAM;  Surgeon: Kerin Perna, MD;  Location: [facility] OR;  Service: Open Heart Surgery;  Laterality: N/A;     OB History   No obstetric history on file.     Family History  Problem Relation Age of Onset  . Heart attack Neg Hx   . Heart disease Neg Hx   . Heart failure Neg Hx   . Hypertension Neg Hx     Social History   Tobacco Use  . Smoking status: Never Smoker  . Smokeless tobacco: Never Used  Substance Use Topics  . Alcohol use: No  . Drug use: No    Home Medications Prior to Admission medications   Medication Sig Start Date End Date Taking? Authorizing Provider  acetaminophen (TYLENOL) 500 MG tablet Take 500-1,000 mg by mouth as needed (pain).   Yes [provider]  aspirin 81 MG chewable tablet Chew 81 mg by mouth daily.   Yes [provider]  cephALEXin (KEFLEX) 500 MG capsule Take 1 capsule (500 mg total) by mouth 2 (two) times daily for 5 days. 10/16/20 10/21/20 Yes Cionna Collantes, Kermit Balo, MD  digoxin (LANOXIN) 0.125 MG tablet TAKE 1 TABLET BY MOUTH DAILY 05/18/20  Yes Jake Bathe, MD  diltiazem (CARDIZEM SR) 120 MG 12 hr capsule TAKE 1 CAPSULE BY MOUTH EVERY DAY 10/04/20  Yes Jake Bathe, MD  furosemide (LASIX) 40 MG tablet Take 1 tablet (40 mg total) by mouth 2 (two) times daily. 09/20/20  Yes Jake Bathe, MD  irbesartan (AVAPRO) 75 MG tablet Take 0.5 tablets (37.5 mg total) by mouth daily. Pt needs to call and make appt with provider for further refills - 1st attempt 08/25/20  Yes Jake Bathe, MD  LORazepam (ATIVAN) 1 MG tablet Take 0.5 tablets (0.5 mg total) by mouth at bedtime for 14 days. 10/16/20 10/30/20 Yes Mort Smelser, Kermit Balo, MD  metoprolol succinate (TOPROL-XL) 25 MG 24 hr tablet Take 0.5 tablets (12.5 mg total) by mouth daily. Take with or immediately following a meal. Patient taking differently: Take 25 mg by mouth daily.  Take with or immediately following a meal. 05/05/20  Yes Filbert Schilder, NP    Allergies    Patient has no known allergies.  Review of Systems   Review of Systems  Constitutional: Negative for chills and fever.  Eyes: Negative for pain and visual disturbance.  Respiratory: Positive for shortness of breath. Negative for cough.   Cardiovascular: Positive for palpitations. Negative for chest pain.  Gastrointestinal: Negative for abdominal pain and vomiting.  Genitourinary: Negative for dysuria and hematuria.  Musculoskeletal: Negative for arthralgias and back pain.  Skin: Negative for color change and rash.  Neurological: Negative for syncope and headaches.  Psychiatric/Behavioral: Positive for sleep disturbance. The patient is nervous/anxious.   All other systems reviewed and are negative.   Physical Exam  Updated Vital Signs BP (!) 120/99   Pulse 66   Temp 98 F (36.7 C) (Oral)   Resp (!) 21   SpO2 94%   Physical Exam Constitutional:      Comments: Thin  HENT:     Head: Normocephalic and atraumatic.  Eyes:     Conjunctiva/sclera: Conjunctivae normal.     Pupils: Pupils are equal, round, and reactive to light.  Cardiovascular:     Rate and Rhythm: Tachycardia present. Rhythm irregular.     Heart sounds: Murmur heard.    Pulmonary:     Effort: Pulmonary effort is normal. No respiratory distress.  Abdominal:     General: There is no distension.     Tenderness: There is no abdominal tenderness.  Musculoskeletal:     Right lower leg: Edema present.     Left lower leg: Edema present.  Skin:    General: Skin is warm and dry.  Neurological:     General: No focal deficit present.     Mental Status: She is alert. Mental status is at baseline.  Psychiatric:        Mood and Affect: Mood normal.        Behavior: Behavior normal.     ED Results / Procedures / Treatments   Labs (all labs ordered are listed, but only abnormal results are displayed) Labs Reviewed   BASIC METABOLIC PANEL - Abnormal; Notable for the following components:      Result Value   Glucose, Bld 133 (*)    Creatinine, Ser 1.13 (*)    GFR, Estimated 46 (*)    All other components within normal limits  BRAIN NATRIURETIC PEPTIDE - Abnormal; Notable for the following components:   B Natriuretic Peptide 1,064.6 (*)    All other components within normal limits  URINALYSIS, ROUTINE W REFLEX MICROSCOPIC - Abnormal; Notable for the following components:   APPearance HAZY (*)    Specific Gravity, Urine 1.004 (*)    Hgb urine dipstick SMALL (*)    Nitrite POSITIVE (*)    Leukocytes,Ua TRACE (*)    Bacteria, UA RARE (*)    All other components within normal limits  TROPONIN I (HIGH SENSITIVITY) - Abnormal; Notable for the following components:   Troponin I (High Sensitivity) 20 (*)    All other components within normal limits  TROPONIN I (HIGH SENSITIVITY) - Abnormal; Notable for the following components:   Troponin I (High Sensitivity) 18 (*)    All other components within normal limits  URINE CULTURE  CBC    EKG EKG Interpretation  Date/Time:  Saturday October 16 2020 08:48:08 EST Ventricular Rate:  118 PR Interval:    QRS Duration: 104 QT Interval:  324 QTC Calculation: 454 R Axis:   -69 Text Interpretation: Atrial fibrillation with rapid ventricular response with premature ventricular or aberrantly conducted complexes Left axis deviation Minimal voltage criteria for LVH, may be normal variant ( Cornell product ) Anteroseptal infarct , age undetermined Abnormal ECG No STEMI Confirmed by Alvester Chou [phone]) on 10/16/2020 9:01:09 AM   Radiology DG Chest Port 1 View  Result Date: 10/16/2020 CLINICAL DATA:  Chest pain EXAM: PORTABLE CHEST 1 VIEW COMPARISON:  Multiple priors, most recent 07/07/2020 FINDINGS: Midline trachea. Moderate cardiomegaly. Marked aneurysmal dilatation of the thoracic aorta, grossly similar to on the prior exam. Prior median sternotomy. No pleural  effusion or pneumothorax. Suspect a skin fold over the right hemithorax superiorly. No congestive failure. Mild bibasilar scarring or subsegmental atelectasis. Right axillary  node dissection. IMPRESSION: Grossly similar appearance of large thoracic aortic aneurysm. Cardiomegaly, without congestive failure. Electronically Signed   By: Jeronimo Greaves M.D.   On: 10/16/2020 09:56    Procedures .Critical Care Performed by: Terald Sleeper, MD Authorized by: Terald Sleeper, MD   Critical care provider statement:    Critical care time (minutes):  35   Critical care was necessary to treat or prevent imminent or life-threatening deterioration of the following conditions:  Circulatory failure   Critical care was time spent personally by me on the following activities:  Discussions with consultants, evaluation of patient's response to treatment, examination of patient, ordering and performing treatments and interventions, ordering and review of laboratory studies, ordering and review of radiographic studies, pulse oximetry, re-evaluation of patient's condition, obtaining history from patient or surrogate and review of old charts Comments:     IV rate control for A Fib with RVR     Medications Ordered in ED Medications  furosemide (LASIX) injection 40 mg (40 mg Intravenous Given 10/16/20 1026)  diltiazem (CARDIZEM) injection 10 mg (10 mg Intravenous Given 10/16/20 1033)  cephALEXin (KEFLEX) capsule 500 mg (500 mg Oral Given 10/16/20 1033)    ED Course  I have reviewed the triage vital signs and the nursing notes.  Pertinent labs & imaging results that were available during my care of the patient were reviewed by me and considered in my medical decision making (see chart for details).  Nonspecific complaint of body burning, difficulty sleeping, restlessness, shortness of breath worse at night.  No active chest pain or pressure in ED.  Patient arrives in A Fib, HR 110 at triage, higher than normal.   We'll consider IV rate control and IV diuresis if necessary.  These symptoms may be related to her worsening CHF, A Fib, and anxiety.    Lower suspicion for thoracic dissection at this time - particularly given that these symptoms are similar to her Nov 2021 symptoms, which improved after treatment.  She is not a candidate for any kind of surgical intervention at this point.  Less likely PE, again, without hypoxia.  I ordered, reviewed, and interpreted labs, which included BNP 1064, UA with +nitrties, leuks, Cr 1.13, K 4.1, Trop 20 -> 18.  CBC unremarkable I ordered medication IV lasix, Iv diltiazem, PO keflex for UTI and diuresis I ordered imaging studies which included dg chest I independently visualized and interpreted imaging which showed chronic thoracic aneurysm unchanged and the monitor tracing which showed A Fib Additional history was obtained from patient's daughter at bedside Previous records obtained and reviewed showing cardiac and CT surgical hx as noted above  On reassessment HR improved 70-90 bpm, irregular.  BP stable.  She is feeling better after IV diuresis and has produced 700 cc urine after lasix.  We can discharge on keflex for possible UTI. Keep on same medication regimen.  Discussed with pt and daughter continuing melatonin, can go up to 2 or 3 mg nightly.  If she continues to feel restless, I prescribed 0.5 mg ativan to trial.  I suspect these is a very strong anxiety component to her insomnia, which is driving the majority of her concerns.  I discussed the pros and cons of benzos with her and her daughter.  We are considering a very short course, and discussion with their PCP about continuing this.  Fall precautions reviewed - no mixing with any other sedatives.    I also advised discussion with PCP about possible antidepressant if  felt to be appropriate.     Clinical Course as of 10/16/20 1627  Sat Oct 16, 2020  1021 UA with positive nitrites, on last visit grew  Aerococcus urinae which was felt to be a likely contaminant and not started on antibx.  I think it's reasonable to trial a course of keflex here and send cx again. [MT]  1152 Hr improved, BP stable [MT]    Clinical Course User Index [MT] Rani Sisney, Kermit BaloMatthew J, MD    Final Clinical Impression(s) / ED Diagnoses Final diagnoses:  Insomnia, unspecified type  Urinary tract infection without hematuria, site unspecified  Congestive heart failure, unspecified HF chronicity, unspecified heart failure type [facility](HCC)    Rx / DC Orders ED Discharge Orders         Ordered    cephALEXin (KEFLEX) 500 MG capsule  2 times daily        10/16/20 1053    LORazepam (ATIVAN) 1 MG tablet  Daily at bedtime        10/16/20 1053           Terald Sleeperrifan, Arrie Zuercher J, MD 10/16/20 1627

## 2020-10-16 NOTE — ED Triage Notes (Signed)
C/o fatigue since last night.  Reports pain across chest and back.  Family states pt here for same before and it was CHF.

## 2020-10-16 NOTE — Discharge Instructions (Signed)
Plan:  Continue all your normal home medications.  You will start Keflex an antibiotic for a possible urine infection.  Your urine culture will take 2 days to return.  Follow up with your primary doctor this week.  You should try melatonin 3 mg at night, one hour before bedtime, for the next few nights.  If this is not working, you should INSTEAD try ativan 0.5 mg before bedtime.  Do not take ativan and melatonin together.  Ativan can make some people dizzy or groggy.  Be very careful getting out of bed.  The medicine usually lasts 4-6 hours, and should help you sleep, and help with anxiety.

## 2020-10-18 LAB — URINE CULTURE: Culture: >=100000 — AB

## 2020-10-19 NOTE — Progress Notes (Signed)
ED Antimicrobial Stewardship Positive Culture Follow Up   [name] is an 87 y.o. female who presented to [facility] on 10/16/2020 with a chief complaint of  Chief Complaint  Patient presents with  . Fatigue  . Chest Pain    Recent Results (from the past 720 hour(s))  Urine culture     Status: Abnormal   Collection Time: 10/16/20 10:20 AM   Specimen: Urine, Random  Result Value Ref Range Status   Specimen Description URINE, RANDOM  Final   Special Requests NONE  Final   Culture (A)  Final    >=100,000 COLONIES/mL AEROCOCCUS SPECIES Standardized susceptibility testing for this organism is not available. Performed at [facility], [address] [place], [place] [address]    Report Status 10/18/2020 FINAL  Final    [x]  Treated with cephalexin, organism resistant to prescribed antimicrobial  Plan: stop cephalexin   ED Provider: , MD   Arby Barrette 10/19/2020, 9:27 AM Clinical Pharmacist Monday - Friday phone -  [phone] Saturday - Sunday phone - [phone]

## 2020-10-27 DIAGNOSIS — F418 Other specified anxiety disorders: Secondary | ICD-10-CM | POA: Diagnosis not present

## 2020-10-27 DIAGNOSIS — N390 Urinary tract infection, site not specified: Secondary | ICD-10-CM | POA: Diagnosis not present

## 2020-10-27 DIAGNOSIS — R300 Dysuria: Secondary | ICD-10-CM | POA: Diagnosis not present

## 2020-11-18 ENCOUNTER — Other Ambulatory Visit: Payer: Self-pay | Admitting: Cardiology

## 2020-12-22 ENCOUNTER — Other Ambulatory Visit: Payer: Self-pay | Admitting: Cardiology

## 2021-02-04 DIAGNOSIS — F320 Major depressive disorder, single episode, mild: Secondary | ICD-10-CM | POA: Diagnosis not present

## 2021-02-04 DIAGNOSIS — N390 Urinary tract infection, site not specified: Secondary | ICD-10-CM | POA: Diagnosis not present

## 2021-02-04 DIAGNOSIS — R300 Dysuria: Secondary | ICD-10-CM | POA: Diagnosis not present

## 2021-02-22 ENCOUNTER — Emergency Department (HOSPITAL_COMMUNITY): Payer: Medicare HMO

## 2021-02-22 ENCOUNTER — Encounter (HOSPITAL_COMMUNITY): Payer: Self-pay | Admitting: Emergency Medicine

## 2021-02-22 ENCOUNTER — Emergency Department (HOSPITAL_COMMUNITY)
Admission: EM | Admit: 2021-02-22 | Discharge: 2021-02-23 | Disposition: A | Discharge status: Home / Self Care | Payer: Medicare HMO | Attending: Emergency Medicine | Admitting: Emergency Medicine

## 2021-02-22 ENCOUNTER — Other Ambulatory Visit: Payer: Self-pay

## 2021-02-22 DIAGNOSIS — Z7982 Long term (current) use of aspirin: Secondary | ICD-10-CM | POA: Diagnosis not present

## 2021-02-22 DIAGNOSIS — N3000 Acute cystitis without hematuria: Secondary | ICD-10-CM | POA: Insufficient documentation

## 2021-02-22 DIAGNOSIS — I110 Hypertensive heart disease with heart failure: Secondary | ICD-10-CM | POA: Diagnosis not present

## 2021-02-22 DIAGNOSIS — R0602 Shortness of breath: Secondary | ICD-10-CM | POA: Diagnosis not present

## 2021-02-22 DIAGNOSIS — I5042 Chronic combined systolic (congestive) and diastolic (congestive) heart failure: Secondary | ICD-10-CM | POA: Insufficient documentation

## 2021-02-22 DIAGNOSIS — R000 Tachycardia, unspecified: Secondary | ICD-10-CM | POA: Diagnosis not present

## 2021-02-22 DIAGNOSIS — Z79899 Other long term (current) drug therapy: Secondary | ICD-10-CM | POA: Insufficient documentation

## 2021-02-22 DIAGNOSIS — I517 Cardiomegaly: Secondary | ICD-10-CM | POA: Diagnosis not present

## 2021-02-22 LAB — CBC
HCT: 40.1 % (ref 36.0–46.0)
Hemoglobin: 12.8 g/dL (ref 12.0–15.0)
MCH: 30.2 pg (ref 26.0–34.0)
MCHC: 31.9 g/dL (ref 30.0–36.0)
MCV: 94.6 fL (ref 80.0–100.0)
Platelets: 240 10*3/uL (ref 150–400)
RBC: 4.24 MIL/uL (ref 3.87–5.11)
RDW: 14 % (ref 11.5–15.5)
WBC: 6.7 10*3/uL (ref 4.0–10.5)
nRBC: 0 % (ref 0.0–0.2)

## 2021-02-22 LAB — BASIC METABOLIC PANEL
Anion gap: 7 (ref 5–15)
BUN: 22 mg/dL (ref 8–23)
CO2: 24 mmol/L (ref 22–32)
Calcium: 9.2 mg/dL (ref 8.9–10.3)
Chloride: 103 mmol/L (ref 98–111)
Creatinine, Ser: 1.07 mg/dL — ABNORMAL HIGH (ref 0.44–1.00)
GFR, Estimated: 49 mL/min — ABNORMAL LOW (ref >60)
Glucose, Bld: 131 mg/dL — ABNORMAL HIGH (ref 70–99)
Potassium: 3.8 mmol/L (ref 3.5–5.1)
Sodium: 134 mmol/L — ABNORMAL LOW (ref 135–145)

## 2021-02-22 LAB — BRAIN NATRIURETIC PEPTIDE: B Natriuretic Peptide: 985.7 pg/mL — ABNORMAL HIGH (ref 0.0–100.0)

## 2021-02-22 LAB — TROPONIN I (HIGH SENSITIVITY): Troponin I (High Sensitivity): 17 ng/L (ref <18)

## 2021-02-22 NOTE — ED Notes (Signed)
Pt did not want to wait due to time and will try to come back later.

## 2021-02-22 NOTE — ED Provider Notes (Signed)
Emergency Medicine Provider Triage Evaluation Note  [name] , a 87 y.o. female  was evaluated in triage.  Pt complains of patient presents with shortness of breath, started yesterday, states she has worsening orthopnea, pedal edema, denies any chest pain, has a history of CHF, she was recently seen approximately 4 months ago for similar presentation.  States she has been taking Lasix still urinating, has no other complaints at this time..  Review of Systems  Positive: Pedal edema, shortness of breath Negative: Abdominal pain, nausea vomiting  Physical Exam  BP 115/71 (BP Location: Left Arm)   Pulse 64   Temp 98.2 F (36.8 C) (Oral)   Resp 16   SpO2 93%  Gen:   Awake, no distress   Resp:  Normal effort bibasilar Rales worse on the left versus the right. MSK:   Moves extremities without difficulty  Other:  1+ p edema bilaterally  Medical Decision Making  Medically screening exam initiated at 6:34 PM.  Appropriate orders placed.  [name] was informed that the remainder of the evaluation will be completed by another provider, this initial triage assessment does not replace that evaluation, and the importance of remaining in the ED until their evaluation is complete.  Presents with shortness of breath, lab or imaging of the order, patient need further work-up in the emergency department.   Carroll Sage, PA-C 02/22/21 1835    Melene Plan, DO 02/22/21 1931

## 2021-02-22 NOTE — ED Triage Notes (Signed)
Pt endorses SOB since yesterday that worsens at night. Family reports its the same as when she was here in March and was CHF. Denies any CP.

## 2021-02-23 ENCOUNTER — Encounter (HOSPITAL_COMMUNITY): Payer: Self-pay

## 2021-02-23 ENCOUNTER — Emergency Department (HOSPITAL_COMMUNITY)
Admission: EM | Admit: 2021-02-23 | Discharge: 2021-02-23 | Disposition: A | Discharge status: Home / Self Care | Payer: Medicare HMO | Source: Home / Self Care | Attending: Emergency Medicine | Admitting: Emergency Medicine

## 2021-02-23 ENCOUNTER — Emergency Department (HOSPITAL_COMMUNITY): Payer: Medicare HMO

## 2021-02-23 ENCOUNTER — Other Ambulatory Visit: Payer: Self-pay

## 2021-02-23 DIAGNOSIS — Z79899 Other long term (current) drug therapy: Secondary | ICD-10-CM | POA: Insufficient documentation

## 2021-02-23 DIAGNOSIS — I5042 Chronic combined systolic (congestive) and diastolic (congestive) heart failure: Secondary | ICD-10-CM | POA: Insufficient documentation

## 2021-02-23 DIAGNOSIS — N3000 Acute cystitis without hematuria: Secondary | ICD-10-CM

## 2021-02-23 DIAGNOSIS — Z7982 Long term (current) use of aspirin: Secondary | ICD-10-CM | POA: Insufficient documentation

## 2021-02-23 DIAGNOSIS — R000 Tachycardia, unspecified: Secondary | ICD-10-CM | POA: Insufficient documentation

## 2021-02-23 DIAGNOSIS — R0602 Shortness of breath: Secondary | ICD-10-CM | POA: Insufficient documentation

## 2021-02-23 DIAGNOSIS — I110 Hypertensive heart disease with heart failure: Secondary | ICD-10-CM | POA: Insufficient documentation

## 2021-02-23 LAB — URINALYSIS, ROUTINE W REFLEX MICROSCOPIC
Bilirubin Urine: NEGATIVE
Glucose, UA: NEGATIVE mg/dL
Ketones, ur: NEGATIVE mg/dL
Nitrite: NEGATIVE
Protein, ur: NEGATIVE mg/dL
Specific Gravity, Urine: 1.01 (ref 1.005–1.030)
pH: 6 (ref 5.0–8.0)

## 2021-02-23 LAB — DIGOXIN LEVEL: Digoxin Level: 0.8 ng/mL (ref 0.8–2.0)

## 2021-02-23 LAB — BASIC METABOLIC PANEL
Anion gap: 10 (ref 5–15)
BUN: 18 mg/dL (ref 8–23)
CO2: 23 mmol/L (ref 22–32)
Calcium: 9.4 mg/dL (ref 8.9–10.3)
Chloride: 104 mmol/L (ref 98–111)
Creatinine, Ser: 1 mg/dL (ref 0.44–1.00)
GFR, Estimated: 53 mL/min — ABNORMAL LOW (ref >60)
Glucose, Bld: 129 mg/dL — ABNORMAL HIGH (ref 70–99)
Potassium: 3.3 mmol/L — ABNORMAL LOW (ref 3.5–5.1)
Sodium: 137 mmol/L (ref 135–145)

## 2021-02-23 LAB — MAGNESIUM: Magnesium: 2 mg/dL (ref 1.7–2.4)

## 2021-02-23 LAB — CBC
HCT: 41.6 % (ref 36.0–46.0)
Hemoglobin: 13.4 g/dL (ref 12.0–15.0)
MCH: 30.1 pg (ref 26.0–34.0)
MCHC: 32.2 g/dL (ref 30.0–36.0)
MCV: 93.5 fL (ref 80.0–100.0)
Platelets: 242 10*3/uL (ref 150–400)
RBC: 4.45 MIL/uL (ref 3.87–5.11)
RDW: 13.9 % (ref 11.5–15.5)
WBC: 6.3 10*3/uL (ref 4.0–10.5)
nRBC: 0 % (ref 0.0–0.2)

## 2021-02-23 LAB — TROPONIN I (HIGH SENSITIVITY)
Troponin I (High Sensitivity): 19 ng/L — ABNORMAL HIGH (ref <18)
Troponin I (High Sensitivity): 18 ng/L — ABNORMAL HIGH (ref <18)

## 2021-02-23 MED ORDER — METOPROLOL TARTRATE 5 MG/5ML IV SOLN
5.0000 mg | Freq: Once | INTRAVENOUS | Status: AC
  Administered 2021-02-23: 5 mg via INTRAVENOUS
  Filled 2021-02-23: qty 5

## 2021-02-23 MED ORDER — LEVOFLOXACIN 750 MG PO TABS: 750.0000 mg | ORAL_TABLET | Freq: Every day | ORAL | 0 refills | Status: AC

## 2021-02-23 MED ORDER — LEVOFLOXACIN 750 MG PO TABS
750.0000 mg | ORAL_TABLET | Freq: Once | ORAL | Status: AC
  Administered 2021-02-23: 750 mg via ORAL
  Filled 2021-02-23: qty 1

## 2021-02-23 NOTE — ED Provider Notes (Signed)
[facility] EMERGENCY DEPARTMENT Provider Note   CSN: [id number] Arrival date & time: 02/23/21  0813     History No chief complaint on file.   [name] is a 87 y.o. female.  HPI  This patient is a 87 year old female, she initially presents in the care of her daughter from home.  She actually came last night after having some weakness and shortness of breath which has been going on for some time.  The patient has a known history of an aortic dissection, it was type a and repaired by cardiothoracic surgery in 2014, she has a history of chronic atrial fibrillation and is not on anticoagulation due to further risk of bleeding, she has history of atrial flutter, congestive heart failure with an ejection fraction of 40 to 45% and a history of hypertension  She is followed by Dr. Anne Fu with cardiology, she has been on furosemide ever since being diagnosed with heart failure in 2014.  I saw this patient in November 2021 for similar symptoms of weakness and shortness of breath which is what she is presenting with today.  This seems to be somewhat episodic and fluctuating, there is no complaints of fevers chills or swelling of the legs, no vomiting or diarrhea.  I have reviewed the medical record at length, she is currently taking digoxin, diltiazem, metoprolol, irbesartan, furosemide and a baby aspirin.    Past Medical History:  Diagnosis Date   Aortic atherosclerosis (HCC) 06/03/2013   Aortic dissection (HCC)    a. Type A aortic dissection thoracic repair by Dr. Maren Beach with biologic Bentall procedure 2014. b. has a persistent patent false lumen distally in the proximal descending thoracic aorta being managed medically as she is not a candidate for redo surgery.   Aortic root aneurysm (HCC)    Chronic atrial fibrillation (HCC)    a. Not on anticoag due to risk of further bleeding from [place] type B dissection.   Chronic atrial flutter (HCC)    Chronic combined  systolic and diastolic CHF (congestive heart failure) (HCC)    a. echo 04/2013: EF 40-45% with diffuse HK, grade 2 DD, mild AI, mild MR, mod LAE, mildly reduced RV function, mild pulm HTN.   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   Aortic dissection (HCC) 07/06/2015   Aortic dissection (HCC) 07/06/2015   Chronic atrial fibrillation (HCC)    Chronic atrial flutter (HCC)    Chronic combined systolic and diastolic CHF (congestive heart failure) (HCC)    Essential hypertension    Hypoxia 08/09/2013   Hypokalemia 08/06/2013   Aortic atherosclerosis (HCC) 06/03/2013   Renal insufficiency 03/04/2013   Intramural aortic hematoma (HCC) 11/07/2012   Aortic root aneurysm [facility])     Past Surgical History:  Procedure Laterality Date   ASCENDING AORTIC ANEURYSM REPAIR  10/2012   BENTALL PROCEDURE N/A 11/11/2012   Procedure: BENTALL PROCEDURE;  Surgeon: Kerin Perna, MD;  Location: [facility] OR;  Service: Open Heart Surgery;  Laterality: N/A;  *[facility] ARREST*    INTRAOPERATIVE TRANSESOPHAGEAL ECHOCARDIOGRAM N/A 11/11/2012   Procedure: INTRAOPERATIVE TRANSESOPHAGEAL ECHOCARDIOGRAM;  Surgeon: Kerin Perna, MD;  Location: [facility] OR;  Service: Open Heart Surgery;  Laterality: N/A;     OB History   No obstetric history on file.     Family History  Problem Relation Age of Onset   Heart attack Neg Hx  Heart disease Neg Hx    Heart failure Neg Hx    Hypertension Neg Hx     Social History   Tobacco Use   Smoking status: Never   Smokeless tobacco: Never  Substance Use Topics   Alcohol use: No   Drug use: No    Home Medications Prior to Admission medications   Medication Sig Start Date End Date Taking? Authorizing Provider  levofloxacin (LEVAQUIN) 750 MG tablet Take 1 tablet (750 mg total) by mouth daily for 7 days. 02/23/21 03/02/21 Yes Eber Hong, MD  acetaminophen (TYLENOL) 500  MG tablet Take 500-1,000 mg by mouth as needed (pain).    [provider]  aspirin 81 MG chewable tablet Chew 81 mg by mouth daily.    [provider]  digoxin (LANOXIN) 0.125 MG tablet TAKE 1 TABLET BY MOUTH DAILY 05/18/20   Jake Bathe, MD  diltiazem (CARDIZEM SR) 120 MG 12 hr capsule TAKE 1 CAPSULE BY MOUTH EVERY DAY 10/04/20   Jake Bathe, MD  furosemide (LASIX) 40 MG tablet TAKE 1 TABLET BY MOUTH TWICE A DAY 12/22/20   Jake Bathe, MD  irbesartan (AVAPRO) 75 MG tablet TAKE 1/2 TABLETS EVERY DAY. PT NEED APPT 11/18/20   Jake Bathe, MD  metoprolol succinate (TOPROL-XL) 25 MG 24 hr tablet Take 0.5 tablets (12.5 mg total) by mouth daily. Take with or immediately following a meal. Patient taking differently: Take 25 mg by mouth daily. Take with or immediately following a meal. 05/05/20   Filbert Schilder, NP    Allergies    Patient has no known allergies.  Review of Systems   Review of Systems  All other systems reviewed and are negative.  Physical Exam Updated Vital Signs BP 132/84   Pulse 76   Temp 97.7 F (36.5 C) (Oral)   Resp 18   SpO2 95%   Physical Exam Vitals and nursing note reviewed.  Constitutional:      General: She is not in acute distress.    Appearance: She is well-developed.  HENT:     Head: Normocephalic and atraumatic.     Mouth/Throat:     Pharynx: No oropharyngeal exudate.  Eyes:     General: No scleral icterus.       Right eye: No discharge.        Left eye: No discharge.     Conjunctiva/sclera: Conjunctivae normal.     Pupils: Pupils are equal, round, and reactive to light.  Neck:     Thyroid: No thyromegaly.     Vascular: No JVD.  Cardiovascular:     Rate and Rhythm: Tachycardia present. Rhythm irregular.     Heart sounds: Normal heart sounds. No murmur heard.   No friction rub. No gallop.     Comments: Heart rate ranges between 90 and 130 Pulmonary:     Effort: Pulmonary effort is normal. No respiratory distress.      Breath sounds: Normal breath sounds. No wheezing or rales.  Abdominal:     General: Bowel sounds are normal. There is no distension.     Palpations: Abdomen is soft. There is no mass.     Tenderness: There is no abdominal tenderness.  Musculoskeletal:        General: No tenderness. Normal range of motion.     Cervical back: Normal range of motion and neck supple.     Right lower leg: No edema.     Left lower leg: No edema.  Lymphadenopathy:  Cervical: No cervical adenopathy.  Skin:    General: Skin is warm and dry.     Findings: No erythema or rash.  Neurological:     General: No focal deficit present.     Mental Status: She is alert.     Coordination: Coordination normal.  Psychiatric:        Behavior: Behavior normal.    ED Results / Procedures / Treatments   Labs (all labs ordered are listed, but only abnormal results are displayed) Labs Reviewed  BASIC METABOLIC PANEL - Abnormal; Notable for the following components:      Result Value   Potassium 3.3 (*)    Glucose, Bld 129 (*)    GFR, Estimated 53 (*)    All other components within normal limits  URINALYSIS, ROUTINE W REFLEX MICROSCOPIC - Abnormal; Notable for the following components:   Hgb urine dipstick MODERATE (*)    Leukocytes,Ua LARGE (*)    Bacteria, UA RARE (*)    All other components within normal limits  TROPONIN I (HIGH SENSITIVITY) - Abnormal; Notable for the following components:   Troponin I (High Sensitivity) 19 (*)    All other components within normal limits  TROPONIN I (HIGH SENSITIVITY) - Abnormal; Notable for the following components:   Troponin I (High Sensitivity) 18 (*)    All other components within normal limits  URINE CULTURE  CBC  DIGOXIN LEVEL  MAGNESIUM    EKG None  Radiology DG Chest 2 View  Result Date: 02/23/2021 CLINICAL DATA:  Shortness of breath EXAM: CHEST - 2 VIEW COMPARISON:  02/22/2021 FINDINGS: Stable cardiomediastinal contours with large thoracic aorta aneurysm.  Increased left basilar atelectasis/consolidation. No significant pleural effusion. No pneumothorax. IMPRESSION: Increased left basilar atelectasis/consolidation. Electronically Signed   By: Guadlupe Spanish M.D.   On: 02/23/2021 09:15   DG Chest 2 View  Result Date: 02/22/2021 CLINICAL DATA:  Shortness of breath, initial encounter EXAM: CHEST - 2 VIEW COMPARISON:  10/16/2020 FINDINGS: Stable appearing large thoracic aortic aneurysm is noted similar to that seen on the prior exam. Cardiomegaly is again noted. Postsurgical changes are seen. The lungs are hyperinflated without focal infiltrate. Degenerative change of the thoracic spine is noted. Postsurgical changes are seen. IMPRESSION: Changes consistent with the known large thoracic aortic aneurysm stable from prior exam. Electronically Signed   By: Alcide Clever M.D.   On: 02/22/2021 19:45    Procedures Procedures   Medications Ordered in ED Medications  metoprolol tartrate (LOPRESSOR) injection 5 mg (5 mg Intravenous Given 02/23/21 0930)  metoprolol tartrate (LOPRESSOR) injection 5 mg (5 mg Intravenous Given 02/23/21 0956)  levofloxacin (LEVAQUIN) tablet 750 mg (750 mg Oral Given 02/23/21 1425)    ED Course  I have reviewed the triage vital signs and the nursing notes.  Pertinent labs & imaging results that were available during my care of the patient were reviewed by me and considered in my medical decision making (see chart for details).    MDM Rules/Calculators/A&P                          The patient's x-ray shows no signs of significant pulmonary findings such as pulmonary edema.  Her vital signs reflect a tachycardia consistent with atrial fibrillation which is what I find on exam as well.  She does not have any focal weakness in fact she is able to move all 4 extremities, she is able to straight leg raise bilaterally has no facial  droop and has normal grips with her bilateral hands.  I will do an evaluation for the cause of her weakness  including checking electrolytes urinalysis and troponin.  She will need some rate control.  Patient agreeable  The patient's x-ray showed a possible left lower infiltrate or atelectasis, the urinalysis revealed signs of infection but thankfully CBC revealed no leukocytosis and the metabolic panel was reassuring.  I discussed this with the family and the patient, they are agreeable to go on to Levaquin for 7 days which should cover both.  Stable for discharge  Final Clinical Impression(s) / ED Diagnoses Final diagnoses:  Acute cystitis without hematuria    Rx / DC Orders ED Discharge Orders          Ordered    levofloxacin (LEVAQUIN) 750 MG tablet  Daily        02/23/21 1326             Eber Hong, MD 02/23/21 1947

## 2021-02-23 NOTE — Discharge Instructions (Signed)
Your x-ray shows that there may be a very small pneumonia in your left lung The urine sample also shows a urinary infection

## 2021-02-23 NOTE — ED Notes (Signed)
Metoprolol given, some effect, IV infiltrated, questionable efficacy, EDP aware, new NSL started.

## 2021-02-23 NOTE — ED Triage Notes (Signed)
Patient here with daughter who has noticed SOB with any exertion and mild ankle swelling. Patient denies CP. Has hx of CHF and today irregular HR in atrial fib. Patient complains of fatigue

## 2021-02-23 NOTE — ED Notes (Signed)
Pt's daughter requested that we call her when pt is up for discharge. If she does not answer, she is most likely in a meeting and requests that she be called a second time and she will order a [place].

## 2021-02-23 NOTE — ED Notes (Signed)
Pt alert, NAD, calm, interactive, resps e/u, speaking in clear complete sentences.  

## 2021-02-23 NOTE — ED Notes (Signed)
HR 82-94 after metoprolol

## 2021-02-23 NOTE — ED Notes (Signed)
Daughter Irem leaving BS to go to work. Can be called anytime. Pt back from xray. EKG Afib RVR 117 at 0821. Daughter reports h/o similar visits for similar sx.

## 2021-02-23 NOTE — ED Notes (Addendum)
Pt alert, NAD, calm, interactive, resps e/u, speaking in clear complete sentences. [place] accent. Speaks English. C/o sob and weakness. HR 100-113. EDP at [facility].

## 2021-02-25 ENCOUNTER — Ambulatory Visit: Payer: Medicare HMO | Admitting: Cardiology

## 2021-02-25 LAB — URINE CULTURE: Culture: >=100000 — AB

## 2021-02-27 ENCOUNTER — Telehealth: Payer: Self-pay | Admitting: Emergency Medicine

## 2021-02-27 NOTE — Telephone Encounter (Signed)
Post ED Visit - Positive Culture Follow-up  Culture report reviewed by antimicrobial stewardship pharmacist: [facility] []  , Pharm.D. []  Enzo Bi, Pharm.D., BCPS AQ-ID []  , Pharm.D., BCPS []  Celedonio Miyamoto, Pharm.D., BCPS []  [place], Garvin Fila.D., BCPS, AAHIVP []  , Pharm.D., BCPS, AAHIVP []  Georgina Pillion, PharmD, BCPS []  , PharmD, BCPS []  [place], PharmD, BCPS []  [place], PharmD []  , PharmD, BCPS []  Estella Husk, PharmD  Pharmacy Team []  Lysle Pearl, PharmD []  , PharmD []  Phillips Climes, PharmD []  , Rph []  Agapito Games) , PharmD []  Delmar Landau, PharmD []  , PharmD []  Mervyn Gay, PharmD []  , PharmD []  Vinnie Level, PharmD []  [facility], PharmD []  , PharmD []  Len Childs, PharmD   Positive urine culture Treated with Levofloxacin, organism sensitive to the same and no further patient follow-up is required at this time.  Georgiana Spillane 02/27/2021, 5:23 PM

## 2021-03-01 DIAGNOSIS — N390 Urinary tract infection, site not specified: Secondary | ICD-10-CM | POA: Diagnosis not present

## 2021-03-01 DIAGNOSIS — E876 Hypokalemia: Secondary | ICD-10-CM | POA: Diagnosis not present

## 2021-03-01 DIAGNOSIS — M79604 Pain in right leg: Secondary | ICD-10-CM | POA: Diagnosis not present

## 2021-03-07 DIAGNOSIS — E876 Hypokalemia: Secondary | ICD-10-CM | POA: Diagnosis not present

## 2021-04-13 ENCOUNTER — Other Ambulatory Visit: Payer: Self-pay | Admitting: Cardiology

## 2021-05-18 ENCOUNTER — Other Ambulatory Visit: Payer: Self-pay | Admitting: Cardiology

## 2021-07-23 ENCOUNTER — Other Ambulatory Visit: Payer: Self-pay | Admitting: Cardiology

## 2021-08-01 ENCOUNTER — Other Ambulatory Visit: Payer: Self-pay | Admitting: Cardiology

## 2021-10-26 IMAGING — DX DG CHEST 1V PORT
1 series · 1 of 1 positions shown · non-contrast
Comparison: Multiple priors, most recent 07/07/2020

CLINICAL DATA: Chest pain

EXAM:
PORTABLE CHEST 1 VIEW

[chest ap]
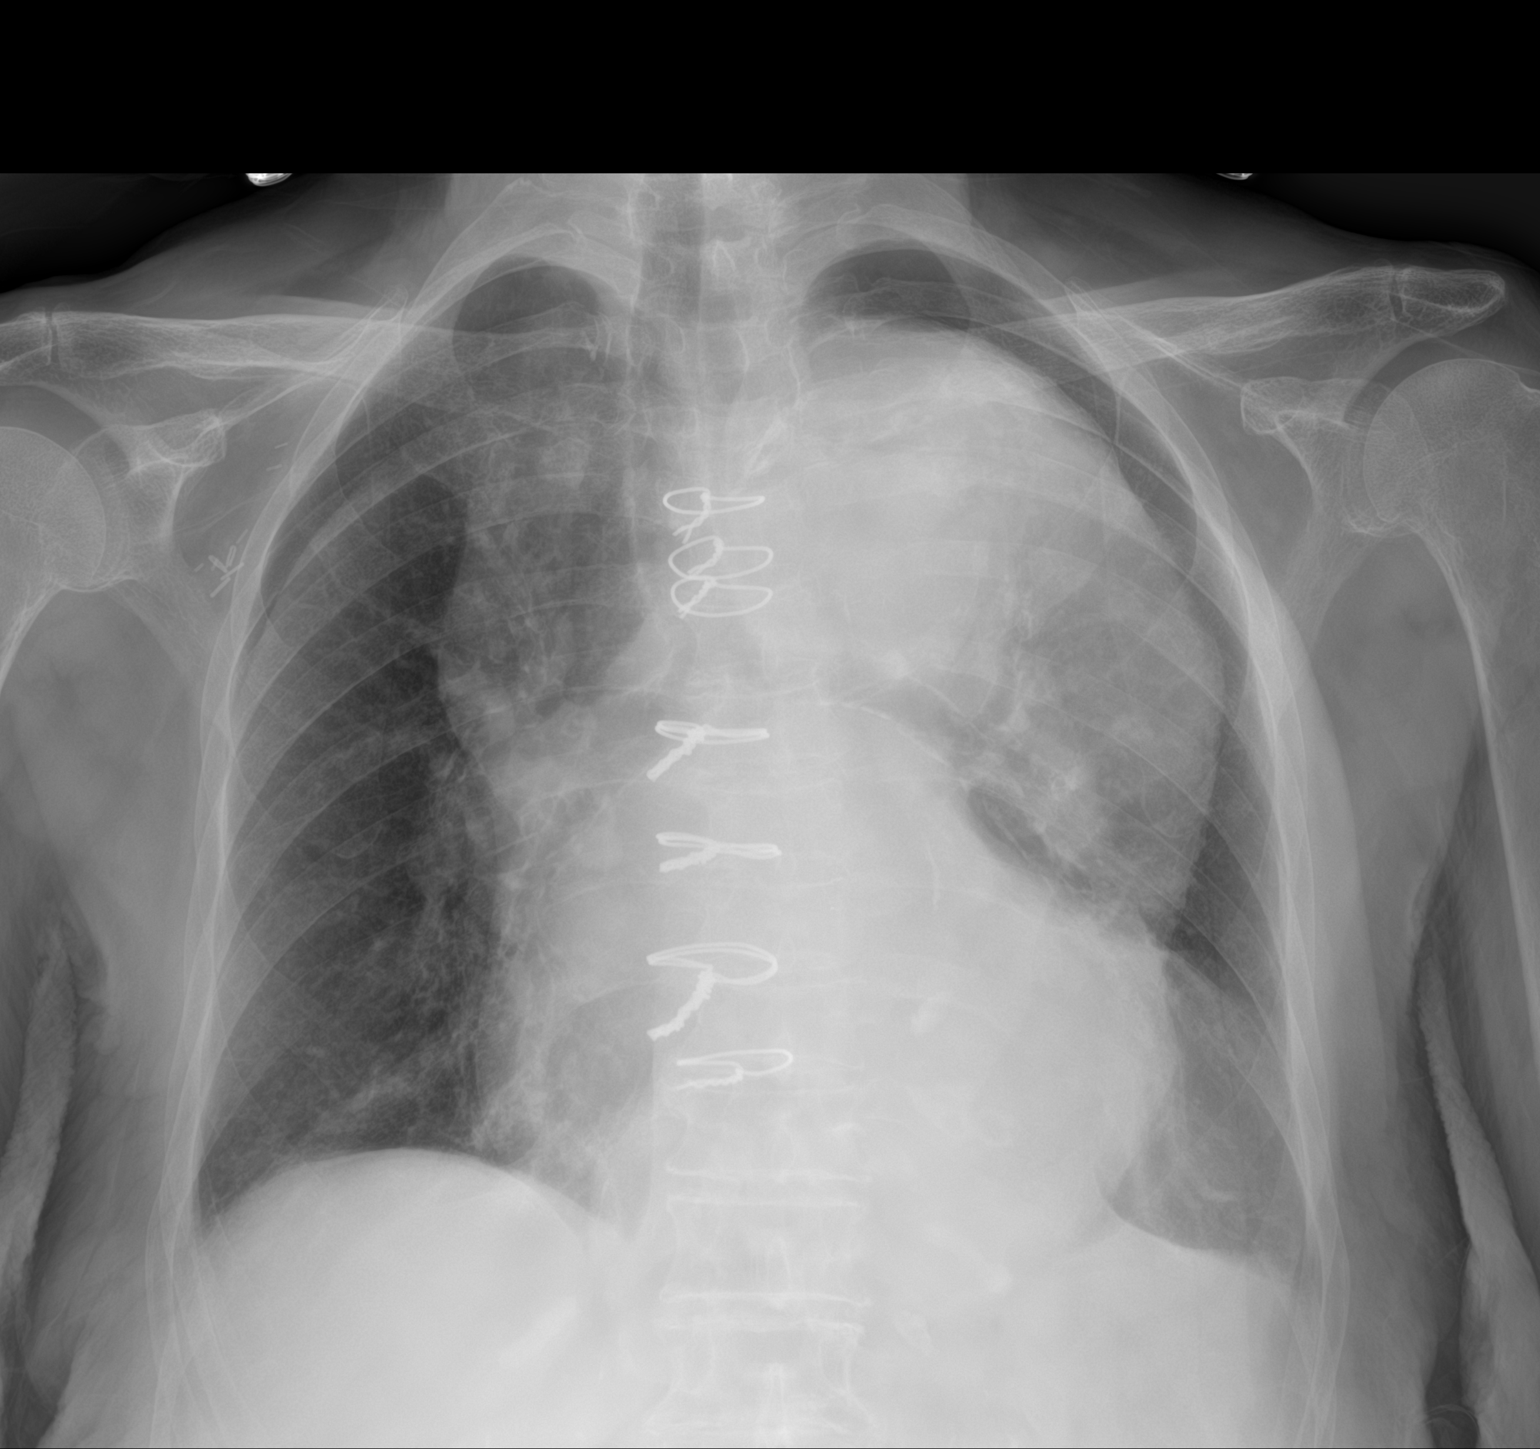

[1 of 1 positions shown; findings below may reference images not displayed]

FINDINGS: Midline trachea. Moderate cardiomegaly. Marked aneurysmal dilatation
of the thoracic aorta, grossly similar to on the prior exam. Prior
median sternotomy. No pleural effusion or pneumothorax. Suspect a
skin fold over the right hemithorax superiorly.

No congestive failure. Mild bibasilar scarring or subsegmental
atelectasis. Right axillary node dissection.
IMPRESSION: Grossly similar appearance of large thoracic aortic aneurysm.

Cardiomegaly, without congestive failure.

## 2021-10-30 ENCOUNTER — Other Ambulatory Visit: Payer: Self-pay | Admitting: Cardiology

## 2021-11-11 ENCOUNTER — Other Ambulatory Visit: Payer: Self-pay | Admitting: Cardiology

## 2021-12-14 ENCOUNTER — Other Ambulatory Visit: Payer: Self-pay | Admitting: Cardiology

## 2021-12-20 ENCOUNTER — Other Ambulatory Visit: Payer: Self-pay | Admitting: Cardiology

## 2021-12-28 ENCOUNTER — Other Ambulatory Visit: Payer: Self-pay | Admitting: Cardiology

## 2022-01-05 ENCOUNTER — Other Ambulatory Visit: Payer: Self-pay | Admitting: Cardiology

## 2022-01-16 ENCOUNTER — Other Ambulatory Visit: Payer: Self-pay | Admitting: Cardiology

## 2022-01-25 ENCOUNTER — Other Ambulatory Visit: Payer: Self-pay | Admitting: Cardiology

## 2022-02-01 ENCOUNTER — Other Ambulatory Visit: Payer: Self-pay | Admitting: Cardiology

## 2022-03-03 ENCOUNTER — Other Ambulatory Visit: Payer: Self-pay | Admitting: Cardiology

## 2022-03-04 IMAGING — DX DG CHEST 2V
2 series · 2 of 2 positions shown · non-contrast
Comparison: 10/16/2020

CLINICAL DATA: Shortness of breath, initial encounter

EXAM:
CHEST - 2 VIEW

[w chest pa]
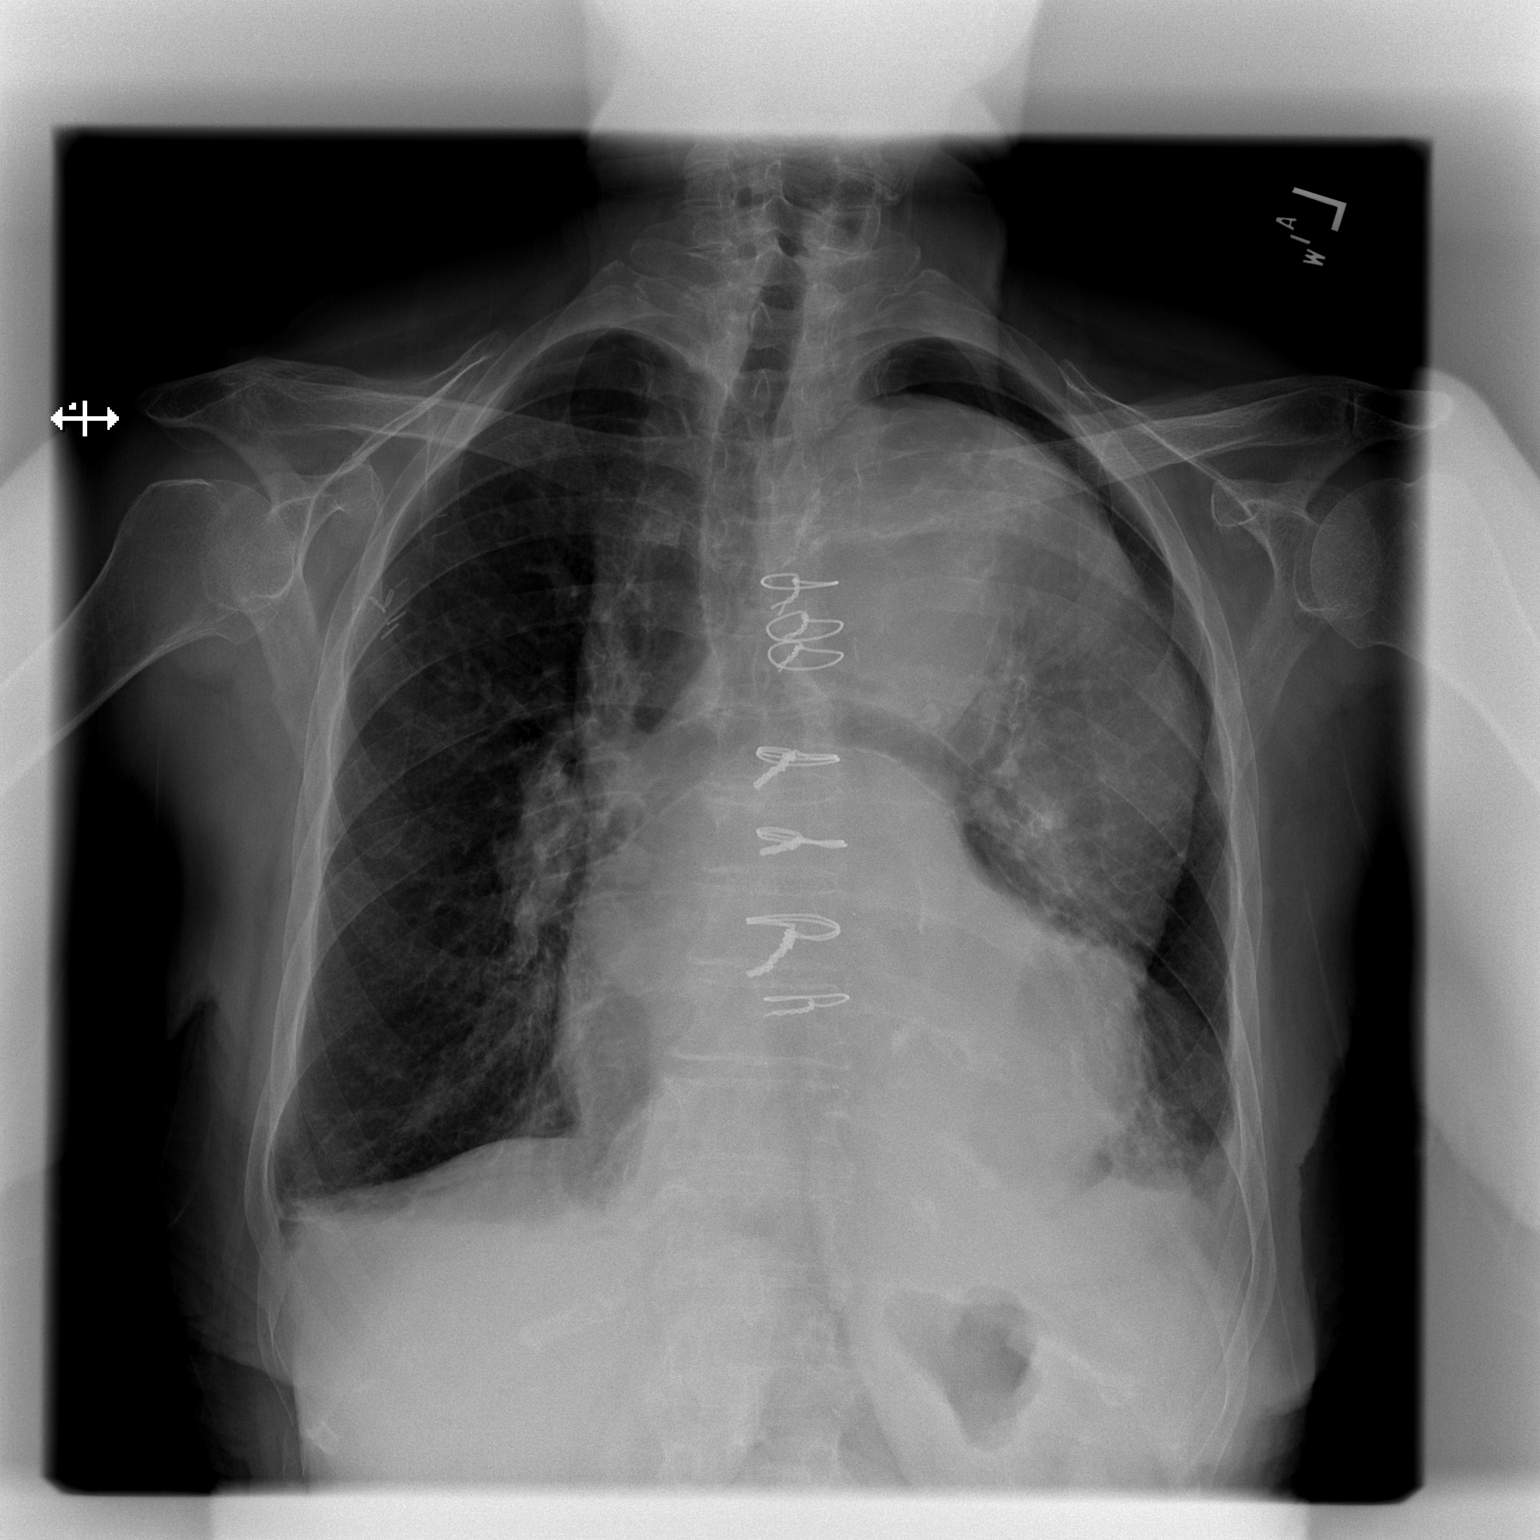

[w chest lat]
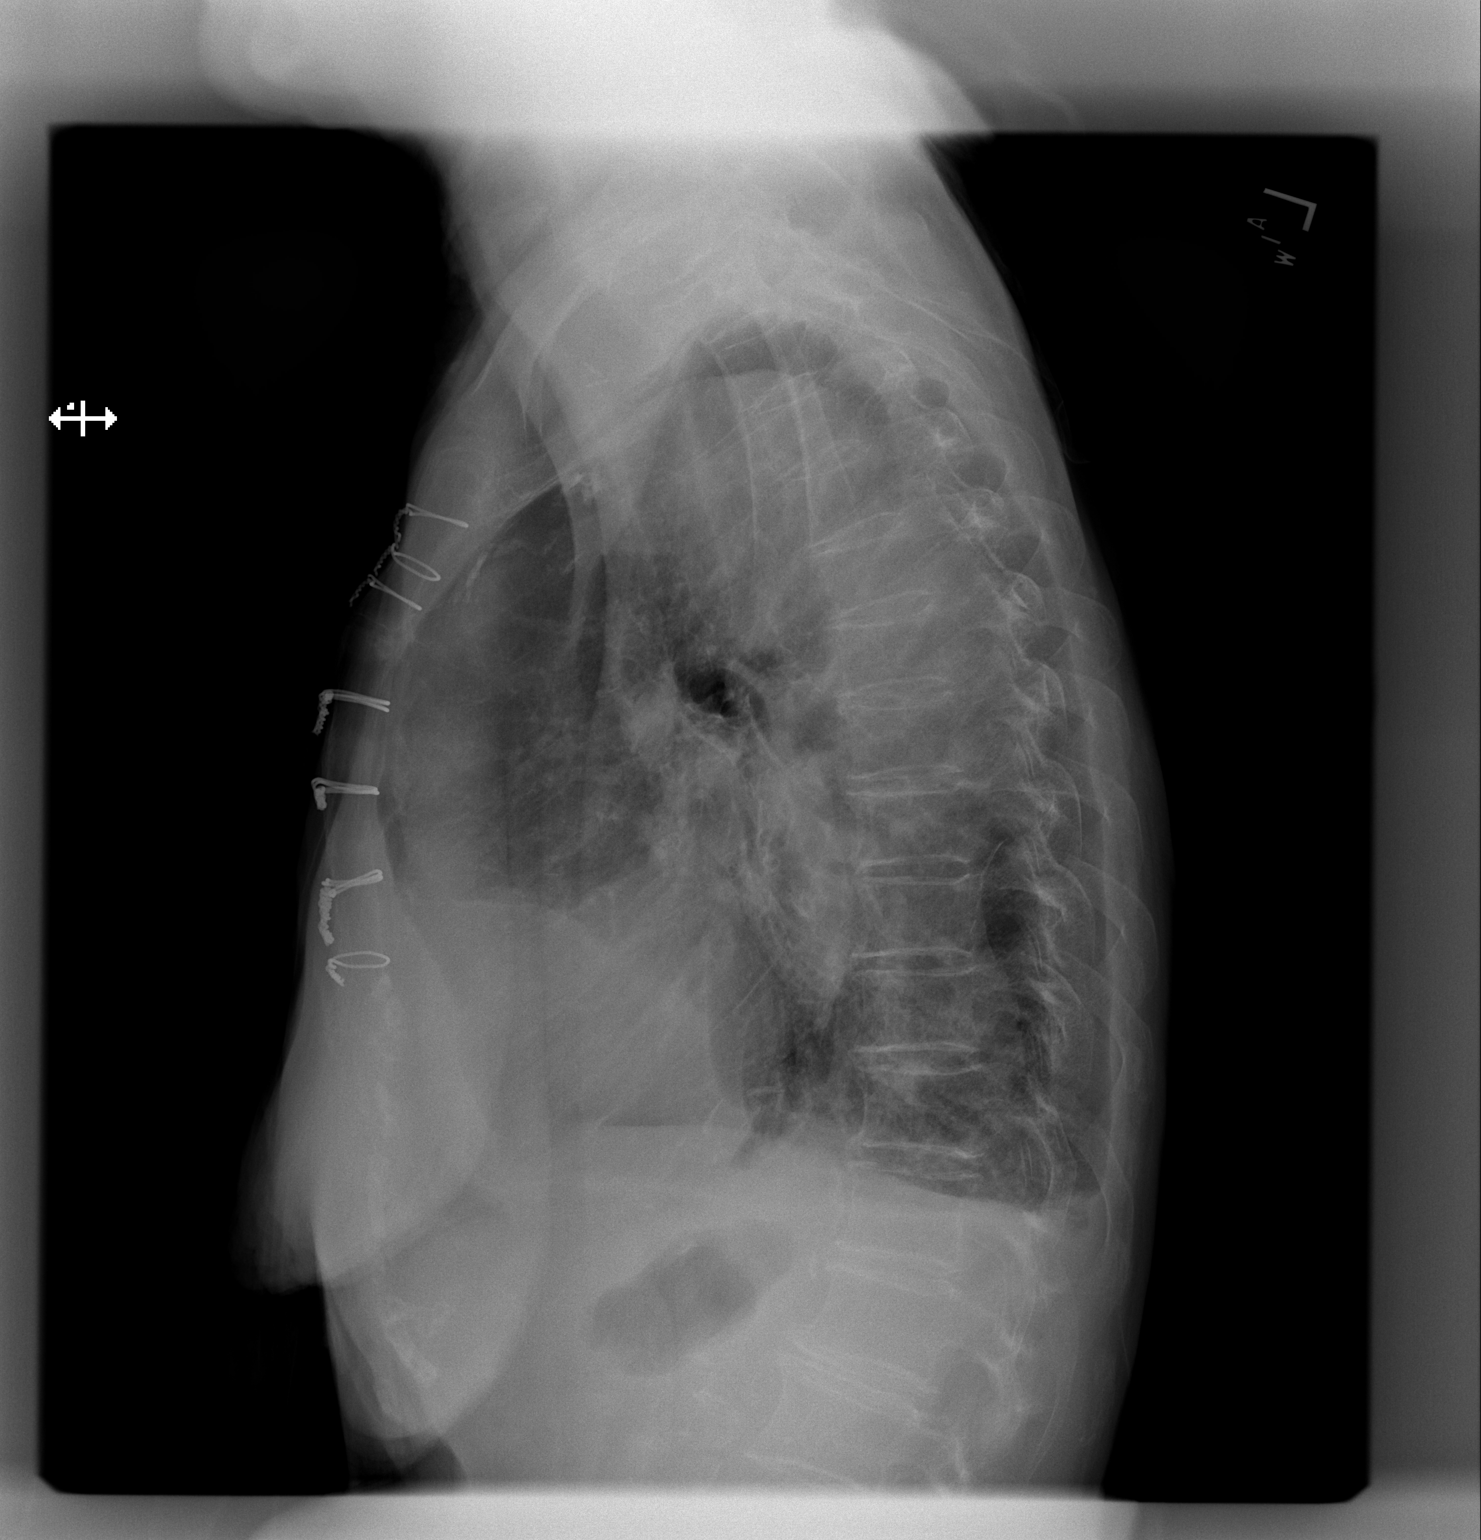

[2 of 2 positions shown; findings below may reference images not displayed]

FINDINGS: Stable appearing large thoracic aortic aneurysm is noted similar to
that seen on the prior exam. Cardiomegaly is again noted.
Postsurgical changes are seen. The lungs are hyperinflated without
focal infiltrate. Degenerative change of the thoracic spine is
noted. Postsurgical changes are seen.
IMPRESSION: Changes consistent with the known large thoracic aortic aneurysm
stable from prior exam.

## 2022-03-05 IMAGING — DX DG CHEST 2V
2 series · 2 of 2 positions shown · non-contrast
Comparison: 02/22/2021

CLINICAL DATA: Shortness of breath

EXAM:
CHEST - 2 VIEW

[chest lat]
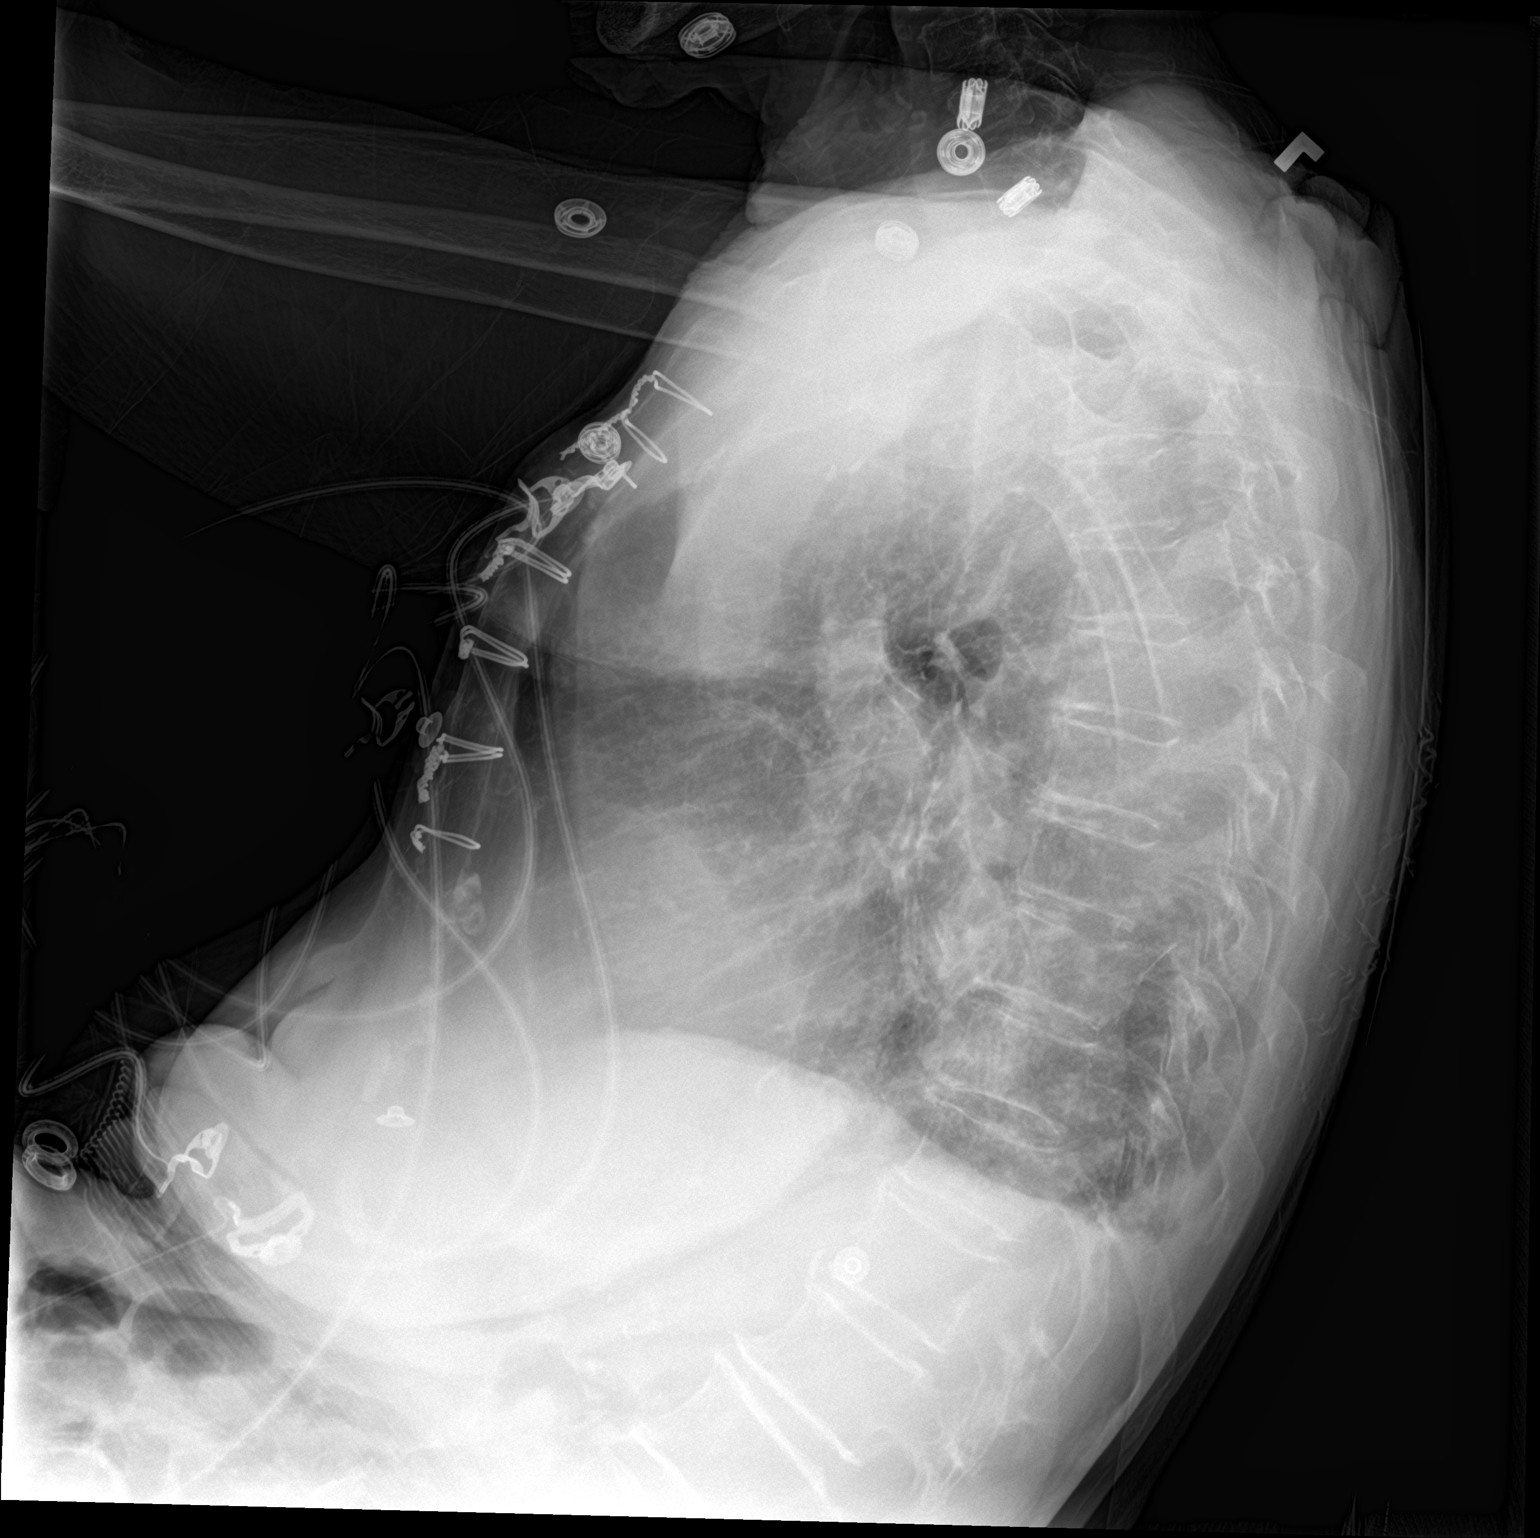

[chest ap]
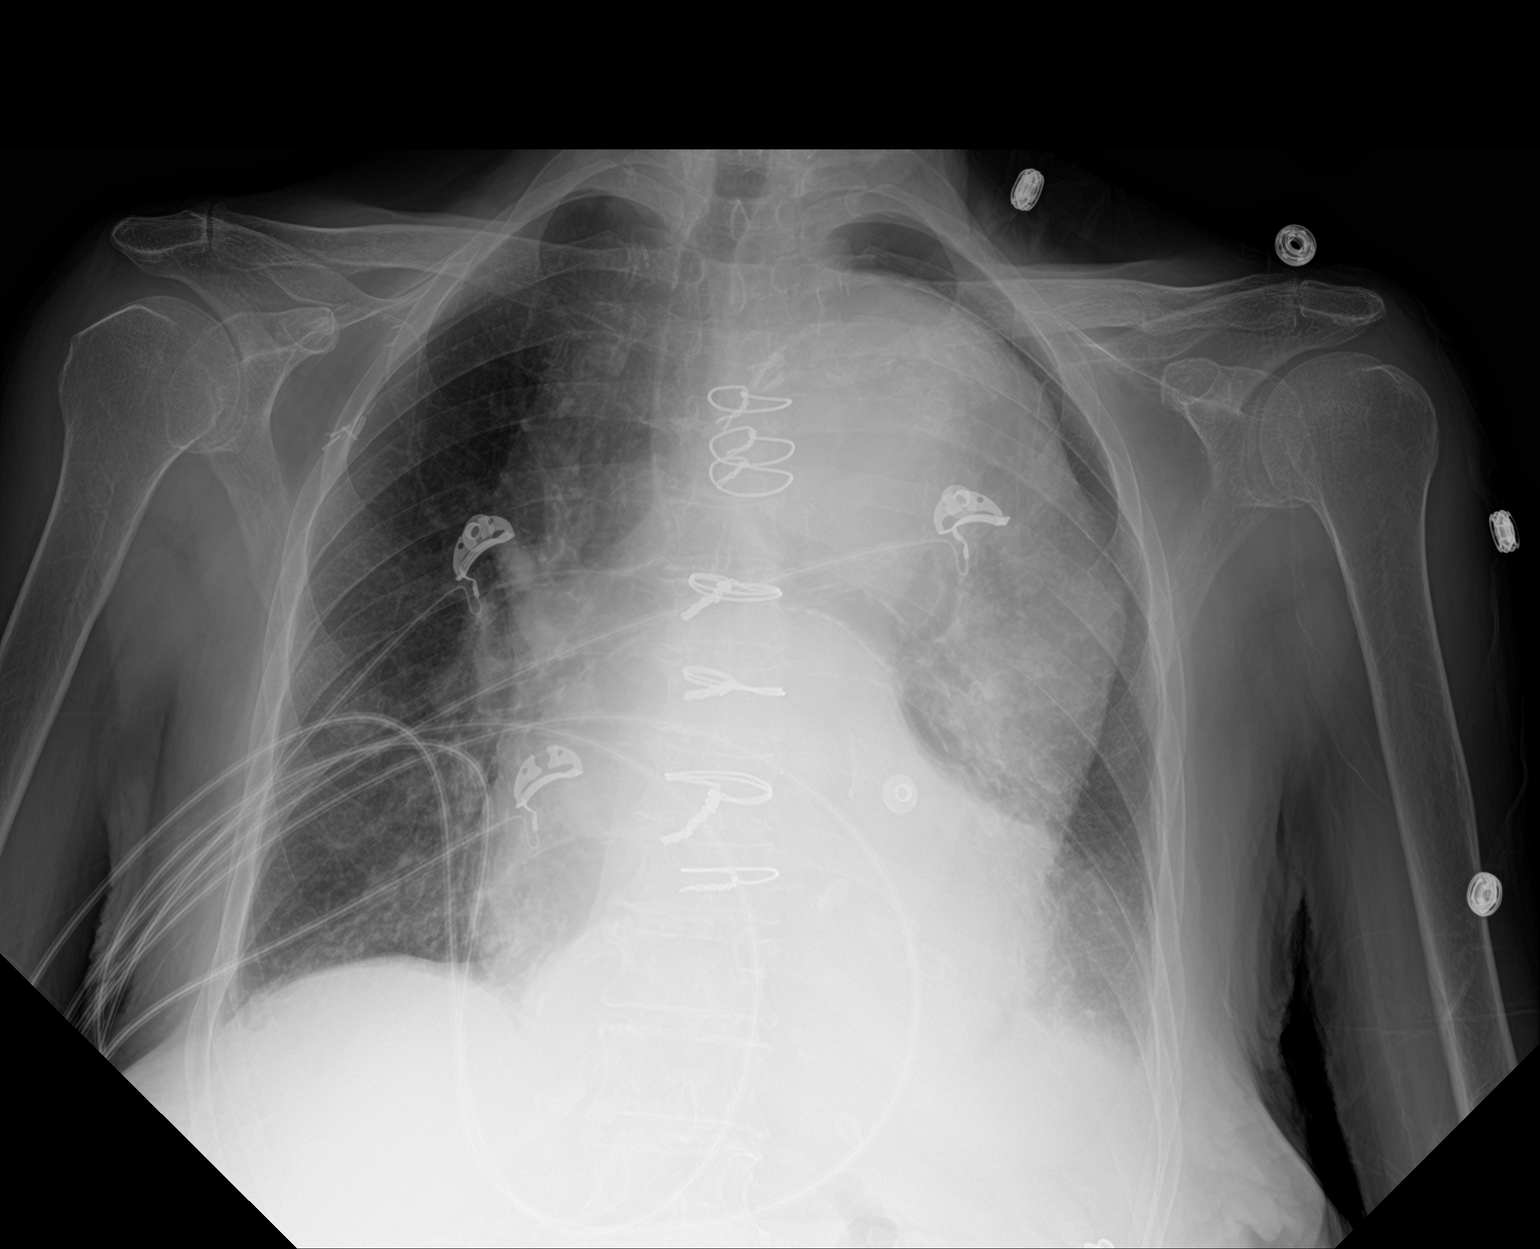

[2 of 2 positions shown; findings below may reference images not displayed]

FINDINGS: Stable cardiomediastinal contours with large thoracic aorta
aneurysm. Increased left basilar atelectasis/consolidation. No
significant pleural effusion. No pneumothorax.
IMPRESSION: Increased left basilar atelectasis/consolidation.

## 2022-03-28 ENCOUNTER — Other Ambulatory Visit: Payer: Self-pay | Admitting: Cardiology

## 2022-10-25 NOTE — Telephone Encounter (Signed)
done

## 2023-03-05 ENCOUNTER — Other Ambulatory Visit: Payer: Self-pay | Admitting: Cardiology
